# Patient Record
Sex: Male | Born: 1950
Health system: Southern US, Community
[De-identification: ages and names within clinical notes are randomized; demographics above are authoritative.]

## PROBLEM LIST (undated history)

## (undated) DIAGNOSIS — I10 Essential (primary) hypertension: Secondary | ICD-10-CM

## (undated) DIAGNOSIS — D6851 Activated protein C resistance: Secondary | ICD-10-CM

## (undated) DIAGNOSIS — I2510 Atherosclerotic heart disease of native coronary artery without angina pectoris: Secondary | ICD-10-CM

## (undated) HISTORY — DX: Activated protein C resistance: D68.51

## (undated) HISTORY — PX: HERNIA REPAIR: SHX51

## (undated) HISTORY — PX: ROTATOR CUFF REPAIR: SHX139

## (undated) HISTORY — PX: JOINT REPLACEMENT: SHX530

---

## 2015-11-06 DIAGNOSIS — M79605 Pain in left leg: Secondary | ICD-10-CM | POA: Insufficient documentation

## 2015-11-06 DIAGNOSIS — M7062 Trochanteric bursitis, left hip: Secondary | ICD-10-CM | POA: Insufficient documentation

## 2015-11-06 DIAGNOSIS — M545 Low back pain, unspecified: Secondary | ICD-10-CM | POA: Insufficient documentation

## 2017-01-05 DIAGNOSIS — M25562 Pain in left knee: Secondary | ICD-10-CM | POA: Insufficient documentation

## 2017-03-18 ENCOUNTER — Emergency Department (HOSPITAL_COMMUNITY): Payer: BLUE CROSS/BLUE SHIELD

## 2017-03-18 ENCOUNTER — Inpatient Hospital Stay (HOSPITAL_COMMUNITY)
Admission: EM | Admit: 2017-03-18 | Discharge: 2017-03-22 | DRG: 175 | Disposition: A | Discharge status: Home / Self Care | Payer: BLUE CROSS/BLUE SHIELD | Attending: Internal Medicine | Admitting: Internal Medicine

## 2017-03-18 ENCOUNTER — Other Ambulatory Visit: Payer: Self-pay

## 2017-03-18 ENCOUNTER — Encounter (HOSPITAL_COMMUNITY): Payer: Self-pay | Admitting: Emergency Medicine

## 2017-03-18 ENCOUNTER — Other Ambulatory Visit: Payer: Self-pay | Admitting: Critical Care Medicine

## 2017-03-18 DIAGNOSIS — J9601 Acute respiratory failure with hypoxia: Secondary | ICD-10-CM | POA: Diagnosis present

## 2017-03-18 DIAGNOSIS — R069 Unspecified abnormalities of breathing: Secondary | ICD-10-CM

## 2017-03-18 DIAGNOSIS — I2602 Saddle embolus of pulmonary artery with acute cor pulmonale: Principal | ICD-10-CM | POA: Diagnosis present

## 2017-03-18 DIAGNOSIS — I2609 Other pulmonary embolism with acute cor pulmonale: Secondary | ICD-10-CM | POA: Diagnosis not present

## 2017-03-18 DIAGNOSIS — D6851 Activated protein C resistance: Secondary | ICD-10-CM

## 2017-03-18 DIAGNOSIS — M25512 Pain in left shoulder: Secondary | ICD-10-CM

## 2017-03-18 DIAGNOSIS — R739 Hyperglycemia, unspecified: Secondary | ICD-10-CM

## 2017-03-18 DIAGNOSIS — Z86718 Personal history of other venous thrombosis and embolism: Secondary | ICD-10-CM | POA: Diagnosis not present

## 2017-03-18 DIAGNOSIS — R748 Abnormal levels of other serum enzymes: Secondary | ICD-10-CM | POA: Diagnosis not present

## 2017-03-18 DIAGNOSIS — E785 Hyperlipidemia, unspecified: Secondary | ICD-10-CM | POA: Diagnosis present

## 2017-03-18 DIAGNOSIS — Z7982 Long term (current) use of aspirin: Secondary | ICD-10-CM

## 2017-03-18 DIAGNOSIS — R000 Tachycardia, unspecified: Secondary | ICD-10-CM | POA: Diagnosis present

## 2017-03-18 DIAGNOSIS — I2699 Other pulmonary embolism without acute cor pulmonale: Secondary | ICD-10-CM | POA: Diagnosis present

## 2017-03-18 DIAGNOSIS — D649 Anemia, unspecified: Secondary | ICD-10-CM

## 2017-03-18 DIAGNOSIS — Z79899 Other long term (current) drug therapy: Secondary | ICD-10-CM | POA: Diagnosis not present

## 2017-03-18 DIAGNOSIS — M62830 Muscle spasm of back: Secondary | ICD-10-CM

## 2017-03-18 DIAGNOSIS — R0902 Hypoxemia: Secondary | ICD-10-CM

## 2017-03-18 DIAGNOSIS — R7989 Other specified abnormal findings of blood chemistry: Secondary | ICD-10-CM

## 2017-03-18 DIAGNOSIS — I82412 Acute embolism and thrombosis of left femoral vein: Secondary | ICD-10-CM | POA: Diagnosis present

## 2017-03-18 DIAGNOSIS — I82432 Acute embolism and thrombosis of left popliteal vein: Secondary | ICD-10-CM | POA: Diagnosis present

## 2017-03-18 DIAGNOSIS — I82442 Acute embolism and thrombosis of left tibial vein: Secondary | ICD-10-CM | POA: Diagnosis present

## 2017-03-18 DIAGNOSIS — I2721 Secondary pulmonary arterial hypertension: Secondary | ICD-10-CM | POA: Diagnosis present

## 2017-03-18 DIAGNOSIS — I10 Essential (primary) hypertension: Secondary | ICD-10-CM | POA: Diagnosis present

## 2017-03-18 DIAGNOSIS — R0602 Shortness of breath: Secondary | ICD-10-CM

## 2017-03-18 DIAGNOSIS — I2692 Saddle embolus of pulmonary artery without acute cor pulmonale: Secondary | ICD-10-CM | POA: Diagnosis not present

## 2017-03-18 HISTORY — DX: Essential (primary) hypertension: I10

## 2017-03-18 LAB — COMPREHENSIVE METABOLIC PANEL
ALBUMIN: 3.7 g/dL (ref 3.5–5.0)
ALT: 24 U/L (ref 17–63)
ANION GAP: 11 (ref 5–15)
AST: 21 U/L (ref 15–41)
Alkaline Phosphatase: 63 U/L (ref 38–126)
BILIRUBIN TOTAL: 2.4 mg/dL — AB (ref 0.3–1.2)
BUN: 18 mg/dL (ref 6–20)
CALCIUM: 9.6 mg/dL (ref 8.9–10.3)
CO2: 23 mmol/L (ref 22–32)
CREATININE: 1.23 mg/dL (ref 0.61–1.24)
Chloride: 102 mmol/L (ref 101–111)
GFR calc non Af Amer: >60 mL/min (ref >60)
GLUCOSE: 101 mg/dL — AB (ref 65–99)
Potassium: 4.6 mmol/L (ref 3.5–5.1)
Sodium: 136 mmol/L (ref 135–145)
TOTAL PROTEIN: 7.6 g/dL (ref 6.5–8.1)

## 2017-03-18 LAB — CBC WITH DIFFERENTIAL/PLATELET
BASOS ABS: 0 10*3/uL (ref 0.0–0.1)
Basophils Relative: 0 %
EOS ABS: 0.1 10*3/uL (ref 0.0–0.7)
Eosinophils Relative: 1 %
HEMATOCRIT: 47.1 % (ref 39.0–52.0)
Hemoglobin: 15.5 g/dL (ref 13.0–17.0)
Lymphocytes Relative: 20 %
Lymphs Abs: 1.6 10*3/uL (ref 0.7–4.0)
MCH: 30.9 pg (ref 26.0–34.0)
MCHC: 32.9 g/dL (ref 30.0–36.0)
MCV: 93.8 fL (ref 78.0–100.0)
MONO ABS: 0.5 10*3/uL (ref 0.1–1.0)
MONOS PCT: 6 %
Neutro Abs: 6 10*3/uL (ref 1.7–7.7)
Neutrophils Relative %: 73 %
PLATELETS: 198 10*3/uL (ref 150–400)
RBC: 5.02 MIL/uL (ref 4.22–5.81)
RDW: 13.3 % (ref 11.5–15.5)
WBC: 8.1 10*3/uL (ref 4.0–10.5)

## 2017-03-18 LAB — TROPONIN I
TROPONIN I: 0.07 ng/mL — AB (ref <0.03)
Troponin I: 0.07 ng/mL (ref <0.03)

## 2017-03-18 LAB — BRAIN NATRIURETIC PEPTIDE: B Natriuretic Peptide: 327.7 pg/mL — ABNORMAL HIGH (ref 0.0–100.0)

## 2017-03-18 LAB — LACTIC ACID, PLASMA
Lactic Acid, Venous: 1 mmol/L (ref 0.5–1.9)
Lactic Acid, Venous: 1 mmol/L (ref 0.5–1.9)

## 2017-03-18 MED ORDER — HEPARIN (PORCINE) IN NACL 100-0.45 UNIT/ML-% IJ SOLN
1650.0000 [IU]/h | INTRAMUSCULAR | Status: DC
  Administered 2017-03-18 – 2017-03-19 (×2): 1600 [IU]/h via INTRAVENOUS
  Administered 2017-03-19 – 2017-03-22 (×3): 1500 [IU]/h via INTRAVENOUS
  Filled 2017-03-18 (×10): qty 250

## 2017-03-18 MED ORDER — HEPARIN BOLUS VIA INFUSION
5500.0000 [IU] | Freq: Once | INTRAVENOUS | Status: AC
  Administered 2017-03-18: 5500 [IU] via INTRAVENOUS
  Filled 2017-03-18: qty 5500

## 2017-03-18 MED ORDER — IOPAMIDOL (ISOVUE-370) INJECTION 76%
INTRAVENOUS | Status: AC
  Administered 2017-03-18: 100 mL
  Filled 2017-03-18: qty 100

## 2017-03-18 MED ORDER — ATORVASTATIN CALCIUM 10 MG PO TABS
10.0000 mg | ORAL_TABLET | Freq: Every day | ORAL | Status: DC
  Administered 2017-03-19 – 2017-03-22 (×4): 10 mg via ORAL
  Filled 2017-03-18 (×4): qty 1

## 2017-03-18 MED ORDER — ASPIRIN EC 81 MG PO TBEC
81.0000 mg | DELAYED_RELEASE_TABLET | Freq: Every day | ORAL | Status: DC
  Administered 2017-03-18 – 2017-03-21 (×4): 81 mg via ORAL
  Filled 2017-03-18 (×6): qty 1

## 2017-03-18 NOTE — ED Triage Notes (Addendum)
Pt arrives from PCP reporting SOB with lack of energy x 10 days, pt reports dry cough, denies edema.   Pt denies recent travel, recent illness, fevers/chills, dysuria.

## 2017-03-18 NOTE — ED Triage Notes (Signed)
Pt in and [place], MD office called and requested a CT angio be ordered, [place], MD notified to see if the order can be placed while the pt waits for a room

## 2017-03-18 NOTE — ED Notes (Signed)
Patient transported to CT 

## 2017-03-18 NOTE — ED Provider Notes (Signed)
[facility] EMERGENCY DEPARTMENT Provider Note   CSN: [id number] Arrival date & time: 03/18/17  1235     History   Chief Complaint Chief Complaint  Patient presents with  . Shortness of Breath    HPI Jose Bowers is a 67 y.o. male.  HPI  Patient with history of factor V Leiden and history of prior right upper extremity DVT presents with complaint of shortness of breath fatigue which is been present over the past 10 days.  He states he has shortness of breath with walking very small distances or even putting on his socks.  He denies any pleuritic chest pain but does feel some tightness in his chest.  He has not had any leg swelling.  He has not had any recent travel trauma or surgery.  He was seen by his primary care doctor today and advised to come to the ED for further evaluation.There are no other associated systemic symptoms, there are no other alleviating or modifying factors.   Past Medical History:  Diagnosis Date  . Hypertension     There are no active problems to display for this patient.   Past Surgical History:  Procedure Laterality Date  . HERNIA REPAIR    . JOINT REPLACEMENT    . ROTATOR CUFF REPAIR         Home Medications    Prior to Admission medications   Not on File    Family History No family history on file.  Social History Social History   Tobacco Use  . Smoking status: Never Smoker  . Smokeless tobacco: Never Used  Substance Use Topics  . Alcohol use: Yes    Comment: "few times a week"  . Drug use: No     Allergies   Patient has no allergy information on record.   Review of Systems Review of Systems  ROS reviewed and all otherwise negative except for mentioned in HPI   Physical Exam Updated Vital Signs BP 101/72   Pulse (!) 111   Temp 99.6 F (37.6 C) (Oral)   Resp 20   Ht 5\' 10"  (1.778 m)   Wt 94.8 kg (209 lb)   SpO2 97%   BMI 29.99 kg/m  Vitals reviewed Physical Exam  Physical Examination:  General appearance - alert, well appearing, and in no distress Mental status - alert, oriented to person, place, and time Eyes - no conjunctival injection, no scleral icterus Mouth - mucous membranes moist, pharynx normal without lesions Neck - supple, no significant adenopathy Chest - clear to auscultation, no wheezes, rales or rhonchi, symmetric air entry Heart - increased rate, regular rhythm, normal S1, S2, no murmurs, rubs, clicks or gallops Abdomen - soft, nontender, nondistended, no masses or organomegaly Neurological - alert, oriented, normal speech,  Extremities - peripheral pulses normal, no pedal edema, no clubbing or cyanosis Skin - normal coloration and turgor, no rashes   ED Treatments / Results  Labs (all labs ordered are listed, but only abnormal results are displayed) Labs Reviewed  COMPREHENSIVE METABOLIC PANEL - Abnormal; Notable for the following components:      Result Value   Glucose, Bld 101 (*)    Total Bilirubin 2.4 (*)    All other components within normal limits  TROPONIN I - Abnormal; Notable for the following components:   Troponin I 0.07 (*)    All other components within normal limits  CBC WITH DIFFERENTIAL/PLATELET  HEPARIN LEVEL (UNFRACTIONATED)  HEPARIN LEVEL (UNFRACTIONATED)  CBC  EKG  EKG Interpretation  Date/Time:  Friday March 18 2017 13:08:49 EST Ventricular Rate:  113 PR Interval:  144 QRS Duration: 96 QT Interval:  336 QTC Calculation: 460 R Axis:   40 Text Interpretation:  Sinus tachycardia Nonspecific T wave abnormality Abnormal ECG No old tracing to compare Confirmed by Alfonzo Beers [phone]) on 03/18/2017 4:11:54 PM       Radiology Dg Chest 2 View  Result Date: 03/18/2017 CLINICAL DATA:  Shortness of breath and chest pain. EXAM: CHEST  2 VIEW COMPARISON:  None. FINDINGS: The lungs are clear. Heart size is upper normal. No pneumothorax or pleural fluid. No bony abnormality. IMPRESSION: No acute disease. Electronically  Signed   By: Inge Rise M.D.   On: 03/18/2017 13:58   Ct Angio Chest Pe W And/or Wo Contrast  Result Date: 03/18/2017 CLINICAL DATA:  Chest pain.  Shortness of breath. EXAM: CT ANGIOGRAPHY CHEST WITH CONTRAST TECHNIQUE: Multidetector CT imaging of the chest was performed using the standard protocol during bolus administration of intravenous contrast. Multiplanar CT image reconstructions and MIPs were obtained to evaluate the vascular anatomy. CONTRAST:  3mL ISOVUE-370 IOPAMIDOL (ISOVUE-370) INJECTION 76% COMPARISON:  Chest radiograph 03/18/2017 FINDINGS: Cardiovascular: Large central bilateral pulmonary emboli. Nearly occlusive left main pulmonary artery embolus, extending to all 3 left-sided lobar arteries, and nonocclusive into the right main pulmonary artery, extending to the right upper lobe and right lower lobar pulmonary arteries, and several segmental branches. Enlargement of the right ventricle with RV/ LV ratio 1.5, evidence of significant right heart strain. Normal caliber of the aorta. Mediastinum/Nodes: No enlarged mediastinal, hilar, or axillary lymph nodes. Thyroid gland, trachea, and esophagus demonstrate no significant findings. Lungs/Pleura: Lungs are clear. No pleural effusion or pneumothorax. Upper Abdomen: No acute abnormality. Musculoskeletal: No chest wall abnormality. No acute or significant osseous findings. Review of the MIP images confirms the above findings. IMPRESSION: Large central bilateral pulmonary embolus, which is nearly occlusive in the left main pulmonary artery extending to all 3 left-sided lobar arterial branches. Nonocclusive right main pulmonary artery embolus extending to the right upper lobar and right lower lobar pulmonary arteries. Heavy clot burden with marked right heart strain with RV/LV ratio of 1.5. Clear lungs. Critical Value/emergent results were called by telephone at the time of interpretation on 03/18/2017 at 4:09 pm to Dr. Pattricia Boss, who verbally  acknowledged these results. Electronically Signed   By: Fidela Salisbury M.D.   On: 03/18/2017 16:10    Procedures Procedures (including critical care time)  Medications Ordered in ED Medications  heparin ADULT infusion 100 units/mL (25000 units/257mL sodium chloride 0.45%) (1,600 Units/hr Intravenous New Bag/Given 03/18/17 1648)  iopamidol (ISOVUE-370) 76 % injection (100 mLs  Contrast Given 03/18/17 1549)  heparin bolus via infusion 5,500 Units (5,500 Units Intravenous Bolus from Bag 03/18/17 1648)   CRITICAL CARE Performed by: Marcha Dutton, Mareena Cavan L Total critical care time: 40  minutes Critical care time was exclusive of separately billable procedures and treating other patients. Critical care was necessary to treat or prevent imminent or life-threatening deterioration. Critical care was time spent personally by me on the following activities: development of treatment plan with patient and/or surrogate as well as nursing, discussions with consultants, evaluation of patient's response to treatment, examination of patient, obtaining history from patient or surrogate, ordering and performing treatments and interventions, ordering and review of laboratory studies, ordering and review of radiographic studies, pulse oximetry and re-evaluation of patient's condition.  Initial Impression / Assessment and Plan / ED Course  I have reviewed the triage vital signs and the nursing notes.  Pertinent labs & imaging results that were available during my care of the patient were reviewed by me and considered in my medical decision making (see chart for details).    4:27 PM heparin ordered and have called PCCM- awaiting call back.  Will also d/w interventional radiologist.  Pt is awake, alert no respiratory distress, talkative.  Plan d/w patient and his wife at the bedside.    4:34 PM d/w critical care, they will see patient in the ED, calling interventional radiology  4:38 PM   D/w Dr. Anselm Pancoast, IR- they will see  patient    Final Clinical Impressions(s) / ED Diagnoses   Final diagnoses:  Other acute pulmonary embolism with acute cor pulmonale ([place])  Hypoxia  Tachycardia    ED Discharge Orders    None       Pixie Casino, MD 03/18/17 1732

## 2017-03-18 NOTE — Progress Notes (Signed)
ANTICOAGULATION CONSULT NOTE - Initial Consult  Pharmacy Consult for heparin Indication: pulmonary embolus  Not on File  Patient Measurements: Height: 5\' 10"  (177.8 cm) Weight: 209 lb (94.8 kg) IBW/kg (Calculated) : 73 Heparin Dosing Weight: 92kg  Vital Signs: Temp: 99.6 F (37.6 C) (01/04 1317) Temp Source: Oral (01/04 1317) BP: 108/77 (01/04 1600) Pulse Rate: 113 (01/04 1600)  Labs: Recent Labs    03/18/17 1331  HGB 15.5  HCT 47.1  PLT 198  CREATININE 1.23  TROPONINI 0.07*    Estimated Creatinine Clearance: 70.1 mL/min (by C-G formula based on SCr of 1.23 mg/dL).   Medical History: Past Medical History:  Diagnosis Date  . Hypertension     Medications:  Infusions:  . heparin      Assessment: 71 yom presented to the ED with SOB. Found to have large bilateral PE with right heart strain. Starting IV heparin. Baseline H/H + platelets are WNL. Troponin is mildly elevated. He is not on anticoagulation PTA but has been on xarelto in the past for history of DVT.   Goal of Therapy:  Heparin level 0.3-0.7 units/ml Monitor platelets by anticoagulation protocol: Yes   Plan:  Heparin bolus 5500 units IV x 1 Heparin gtt 1600 units/hr Check a 6 hr heparin level  Daily heparin level and CBC  Zarahi Fuerst, Rande Lawman 03/18/2017,4:47 PM

## 2017-03-18 NOTE — ED Provider Notes (Signed)
Received call from radiology patient with large pe with right heart strain- discussed with Dr. Canary Brim.   Pattricia Boss, MD 03/18/17 [phone]

## 2017-03-18 NOTE — H&P (Deleted)
Chief Complaint: Patient was seen in consultation today for  Chief Complaint  Patient presents with  . Shortness of Breath    Referring Physician(s): ED  Supervising Physician: Jacqulynn Cadet  Patient Status: [facility] - ED  History of Present Illness: Jose Bowers is a 67 y.o. male with Factor V Leiden presented to the ED today with SOB.  His father also has Factor V Leiden.  He states he has had some knee pain and has not been as active as he usually is. He did have steroid injection in his knee a few weeks ago.  He states he had shoulder surgery several years ago and developed a DVT in his arm. He was on blood thinners for 3 months then he was taken off of them.  He denies recent long travel, car nor a plane.   He currently stable. Sats were 92% on room air initially, but now he is on 3 liters [place] and is 97%.  He ate a power bar 2 hours ago.  He denies any bleeding risk. No recent surgeries. No GI bleeding. No history of intracranial bleeding.  Past Medical History:  Diagnosis Date  . Hypertension     Past Surgical History:  Procedure Laterality Date  . HERNIA REPAIR    . JOINT REPLACEMENT    . ROTATOR CUFF REPAIR      Allergies: Patient has no allergy information on record.  Medications: Prior to Admission medications   Not on File     No family history on file.  Social History   Socioeconomic History  . Marital status: Married    Spouse name: None  . Number of children: None  . Years of education: None  . Highest education level: None  Social Needs  . Financial resource strain: None  . Food insecurity - worry: None  . Food insecurity - inability: None  . Transportation needs - medical: None  . Transportation needs - non-medical: None  Occupational History  . None  Tobacco Use  . Smoking status: Never Smoker  . Smokeless tobacco: Never Used  Substance and Sexual Activity  . Alcohol use: Yes    Comment: "few times a week"  . Drug  use: No  . Sexual activity: None  Other Topics Concern  . None  Social History Narrative  . None     Review of Systems: A 12 point ROS discussed and pertinent positives are indicated in the HPI above.  All other systems are negative.  Review of Systems  Cardiovascular: Positive for chest pain.  Genitourinary: Positive for discharge.    Vital Signs: BP 101/72   Pulse (!) 111   Temp 99.6 F (37.6 C) (Oral)   Resp 20   Ht 5\' 10"  (1.778 m)   Wt 209 lb (94.8 kg)   SpO2 97%   BMI 29.99 kg/m   Physical Exam  Constitutional: He is oriented to person, place, and time. He appears well-developed.  HENT:  Head: Normocephalic and atraumatic.  Eyes: EOM are normal.  Neck: Normal range of motion.  Cardiovascular: Normal rate, regular rhythm and normal heart sounds.  Pulmonary/Chest: Effort normal. No respiratory distress. He has no wheezes.  Abdominal: Soft. He exhibits no distension.  Musculoskeletal: Normal range of motion.  Neurological: He is alert and oriented to person, place, and time.  Skin: Skin is warm and dry.  Psychiatric: He has a normal mood and affect. His behavior is normal. Judgment and thought content normal.  Vitals reviewed.  Imaging: Dg Chest 2 View  Result Date: 03/18/2017 CLINICAL DATA:  Shortness of breath and chest pain. EXAM: CHEST  2 VIEW COMPARISON:  None. FINDINGS: The lungs are clear. Heart size is upper normal. No pneumothorax or pleural fluid. No bony abnormality. IMPRESSION: No acute disease. Electronically Signed   By: Inge Rise M.D.   On: 03/18/2017 13:58   Ct Angio Chest Pe W And/or Wo Contrast  Result Date: 03/18/2017 CLINICAL DATA:  Chest pain.  Shortness of breath. EXAM: CT ANGIOGRAPHY CHEST WITH CONTRAST TECHNIQUE: Multidetector CT imaging of the chest was performed using the standard protocol during bolus administration of intravenous contrast. Multiplanar CT image reconstructions and MIPs were obtained to evaluate the vascular  anatomy. CONTRAST:  9mL ISOVUE-370 IOPAMIDOL (ISOVUE-370) INJECTION 76% COMPARISON:  Chest radiograph 03/18/2017 FINDINGS: Cardiovascular: Large central bilateral pulmonary emboli. Nearly occlusive left main pulmonary artery embolus, extending to all 3 left-sided lobar arteries, and nonocclusive into the right main pulmonary artery, extending to the right upper lobe and right lower lobar pulmonary arteries, and several segmental branches. Enlargement of the right ventricle with RV/ LV ratio 1.5, evidence of significant right heart strain. Normal caliber of the aorta. Mediastinum/Nodes: No enlarged mediastinal, hilar, or axillary lymph nodes. Thyroid gland, trachea, and esophagus demonstrate no significant findings. Lungs/Pleura: Lungs are clear. No pleural effusion or pneumothorax. Upper Abdomen: No acute abnormality. Musculoskeletal: No chest wall abnormality. No acute or significant osseous findings. Review of the MIP images confirms the above findings. IMPRESSION: Large central bilateral pulmonary embolus, which is nearly occlusive in the left main pulmonary artery extending to all 3 left-sided lobar arterial branches. Nonocclusive right main pulmonary artery embolus extending to the right upper lobar and right lower lobar pulmonary arteries. Heavy clot burden with marked right heart strain with RV/LV ratio of 1.5. Clear lungs. Critical Value/emergent results were called by telephone at the time of interpretation on 03/18/2017 at 4:09 pm to Dr. Pattricia Boss, who verbally acknowledged these results. Electronically Signed   By: Fidela Salisbury M.D.   On: 03/18/2017 16:10    Labs:  CBC: Recent Labs    03/18/17 1331  WBC 8.1  HGB 15.5  HCT 47.1  PLT 198    COAGS: No results for input(s): INR, APTT in the last 8760 hours.  BMP: Recent Labs    03/18/17 1331  NA 136  K 4.6  CL 102  CO2 23  GLUCOSE 101*  BUN 18  CALCIUM 9.6  CREATININE 1.23  GFRNONAA >60  GFRAA >60    LIVER FUNCTION  TESTS: Recent Labs    03/18/17 1331  BILITOT 2.4*  AST 21  ALT 24  ALKPHOS 63  PROT 7.6  ALBUMIN 3.7    TUMOR MARKERS: No results for input(s): AFPTM, CEA, CA199, CHROMGRNA in the last 8760 hours.  Assessment and Plan:  Acute massive PE in the setting Factor V Leiden   Will proceed with initiation of catheter directed lysis by Dr. Laurence Ferrari.  Risks and benefits discussed with the patient including, but not limited to bleeding, possible life threatening bleeding and need for blood product transfusion, vascular injury, stroke, contrast induced renal failure, limb loss and infection.  All of the patient's questions were answered, patient is agreeable to proceed. Consent signed and in chart.   Electronically Signed: Murrell Redden, PA-C 03/18/2017, 5:18 PM   I spent a total of 40 Minutes in face to face in clinical consultation, greater than 50% of which was counseling/coordinating care for PE lysis.

## 2017-03-18 NOTE — H&P (Signed)
Name: Jose Bowers MRN: [id number] DOB: [birth year]    ADMISSION DATE:  03/18/2017 CONSULTATION DATE:  Ernst Breach  REFERRING MD :  Dr. Marcha Dutton EDP  CHIEF COMPLAINT:  SOB  HISTORY OF PRESENT ILLNESS: 67 year old male with past medical history as below, which is significant for hypertension, hyperlipidemia, and factor V Leiden.  He does have a history of DVT after shoulder surgery about 5 years ago, which was treated with Xarelto.  Recent course is significant until about 3 weeks ago when he developed left knee pain and was seen in primary care and given a steroid injection.  Reports symptoms resolved at that time.  About 10 days prior to arrival he began to experience dyspnea dyspnea was progressive over the following 10 days occasional chest tightness.  Until he was unable to walk throughout his house or put on his socks without becoming short of breath.  He presented to [facility] emergency department 1/4 with this complaint.  He had a CT angiogram of his chest performed, which demonstrated saddle PE with large clot burden.  RV/LV ratio 1.5.  He denies any recent injury and travel. EDP contacted interventional radiology for consideration of EKOS and PCCM has been asked to see for admission.   SIGNIFICANT EVENTS    STUDIES:  CT angiogram chest 1/4 > Large central bilateral pulmonary embolus, which is nearly occlusive in the left main pulmonary artery extending to all 3 left-sided lobar arterial branches. Nonocclusive right main pulmonary artery embolus extending to the right upper lobar and right lower lobar pulmonary arteries. Heavy clot burden with marked right heart strain with RV/LV ratio of 1.5.   PAST MEDICAL HISTORY :   has a past medical history of Hypertension.  has a past surgical history that includes Joint replacement; Rotator cuff repair; and Hernia repair. Prior to Admission medications   Medication Sig Start Date End Date Taking? Authorizing Provider  amoxicillin (AMOXIL) 500 MG  capsule Take 2,000 mg by mouth See admin instructions. Take 4 capsules (2000 mg) by mouth one hour prior to dental appointment (last visit 01/30/17)   Yes [provider]  aspirin EC 81 MG tablet Take 81 mg by mouth at bedtime.   Yes [provider]  atorvastatin (LIPITOR) 10 MG tablet Take 10 mg by mouth daily. 02/14/17  Yes [provider]  fluocinonide cream (LIDEX) 5.00 % Apply 1 application topically daily as needed (itching/rash (Grover's disease)).   Yes [provider]  lisinopril-hydrochlorothiazide (PRINZIDE,ZESTORETIC) 20-12.5 MG tablet Take 1 tablet by mouth daily. 02/14/17  Yes [provider]   No Known Allergies  FAMILY HISTORY:  family history is not on file. SOCIAL HISTORY:  reports that  has never smoked. he has never used smokeless tobacco. He reports that he drinks alcohol. He reports that he does not use drugs.  Review of Systems:   Bolds are positive  Constitutional: weight loss, gain, night sweats, Fevers, chills, fatigue .  HEENT: headaches, Sore throat, sneezing, nasal congestion, post nasal drip, Difficulty swallowing, Tooth/dental problems, visual complaints visual changes, ear ache CV:  chest pain, radiates:*Orthopnea, PND, swelling in lower extremities, dizziness, palpitations, syncope.  GI  heartburn, indigestion, abdominal pain, nausea, vomiting, diarrhea, change in bowel habits, loss of appetite, bloody stools.  Resp: cough, productive: , hemoptysis, dyspnea, chest pain, pleuritic.  Skin: rash or itching or icterus GU: dysuria, change in color of urine, urgency or frequency. flank pain, hematuria  MS: joint pain or swelling. decreased range of motion  Psych: change in  mood or affect. depression or anxiety.  Neuro: difficulty with speech, weakness, numbness, ataxia    SUBJECTIVE:   VITAL SIGNS: Temp:  [99.6 F (37.6 C)] 99.6 F (37.6 C) (01/04 1317) Pulse Rate:  [109-115] 109 (01/04 1730) Resp:  [20] 20 (01/04  1317) BP: (93-108)/(72-81) 106/81 (01/04 1730) SpO2:  [92 %-97 %] 97 % (01/04 1730) Weight:  [94.8 kg (209 lb)] 94.8 kg (209 lb) (01/04 1626)  PHYSICAL EXAMINATION: General:  Overweight male in NAD resting comfortably in bed Neuro:  Alert, oriented, non-focal.  HEENT:  [place]/AT, PERRL, no JVD Cardiovascular:  RRR, no MRG Lungs:  Clear bilateral breath sounds Abdomen:  Soft, non-tender, non-distended Musculoskeletal:  No acute deformity or ROM limitation Skin:  Grossly intact.   Recent Labs  Lab 03/18/17 1331  NA 136  K 4.6  CL 102  CO2 23  BUN 18  CREATININE 1.23  GLUCOSE 101*   Recent Labs  Lab 03/18/17 1331  HGB 15.5  HCT 47.1  WBC 8.1  PLT 198   Dg Chest 2 View  Result Date: 03/18/2017 CLINICAL DATA:  Shortness of breath and chest pain. EXAM: CHEST  2 VIEW COMPARISON:  None. FINDINGS: The lungs are clear. Heart size is upper normal. No pneumothorax or pleural fluid. No bony abnormality. IMPRESSION: No acute disease. Electronically Signed   By: Inge Rise M.D.   On: 03/18/2017 13:58   Ct Angio Chest Pe W And/or Wo Contrast  Result Date: 03/18/2017 CLINICAL DATA:  Chest pain.  Shortness of breath. EXAM: CT ANGIOGRAPHY CHEST WITH CONTRAST TECHNIQUE: Multidetector CT imaging of the chest was performed using the standard protocol during bolus administration of intravenous contrast. Multiplanar CT image reconstructions and MIPs were obtained to evaluate the vascular anatomy. CONTRAST:  38mL ISOVUE-370 IOPAMIDOL (ISOVUE-370) INJECTION 76% COMPARISON:  Chest radiograph 03/18/2017 FINDINGS: Cardiovascular: Large central bilateral pulmonary emboli. Nearly occlusive left main pulmonary artery embolus, extending to all 3 left-sided lobar arteries, and nonocclusive into the right main pulmonary artery, extending to the right upper lobe and right lower lobar pulmonary arteries, and several segmental branches. Enlargement of the right ventricle with RV/ LV ratio 1.5, evidence of  significant right heart strain. Normal caliber of the aorta. Mediastinum/Nodes: No enlarged mediastinal, hilar, or axillary lymph nodes. Thyroid gland, trachea, and esophagus demonstrate no significant findings. Lungs/Pleura: Lungs are clear. No pleural effusion or pneumothorax. Upper Abdomen: No acute abnormality. Musculoskeletal: No chest wall abnormality. No acute or significant osseous findings. Review of the MIP images confirms the above findings. IMPRESSION: Large central bilateral pulmonary embolus, which is nearly occlusive in the left main pulmonary artery extending to all 3 left-sided lobar arterial branches. Nonocclusive right main pulmonary artery embolus extending to the right upper lobar and right lower lobar pulmonary arteries. Heavy clot burden with marked right heart strain with RV/LV ratio of 1.5. Clear lungs. Critical Value/emergent results were called by telephone at the time of interpretation on 03/18/2017 at 4:09 pm to Dr. Pattricia Boss, who verbally acknowledged these results. Electronically Signed   By: Fidela Salisbury M.D.   On: 03/18/2017 16:10    ASSESSMENT / PLAN:  Pulmonary embolism: large saddle PE occlusive on the L. Large clot burden. RV/LV ratio 1.5. In the setting of Factor V Leiden. Troponin mildly elevated. Despite appearance on CT, patient looks pretty good. He has no respiratory distress on 2L [place] with O2 sats 99%. He is able to speak in full paragraphs without stopping to catch his breath. Case has been discussed with  interventional radiology. He is considered a candidate for EKOS, however, based on his clinical appearance we will treat with heparin and assess echocardiogram. Will also trend troponin. If R heart strain appears significant on these studies will proceed with EKOS in the AM.  Plan: Continue heparin Trend troponin Echocardiogram Venous dopplers Supplemental O2 Re-evaluate need for EKOS after echo.  Will likely need life-long anticoagulation in the  setting of second clot and Factor V  Hypertension - Holding lisinopril/HCTZ - Continue asa 81mg   Hyperlipidemia - Continue atorvastatin  Diet: heart healthy VTE ppx: full dose heparin Dispo: SDU Code: FULL  Georgann Housekeeper, AGACNP-BC [facility] Pager [phone] or [phone]  03/18/2017 6:42 PM

## 2017-03-19 ENCOUNTER — Inpatient Hospital Stay (HOSPITAL_COMMUNITY): Payer: BLUE CROSS/BLUE SHIELD

## 2017-03-19 ENCOUNTER — Encounter (HOSPITAL_COMMUNITY): Payer: Self-pay | Admitting: Interventional Radiology

## 2017-03-19 DIAGNOSIS — I2692 Saddle embolus of pulmonary artery without acute cor pulmonale: Secondary | ICD-10-CM

## 2017-03-19 DIAGNOSIS — I2699 Other pulmonary embolism without acute cor pulmonale: Secondary | ICD-10-CM

## 2017-03-19 HISTORY — PX: IR ANGIOGRAM SELECTIVE EACH ADDITIONAL VESSEL: IMG667

## 2017-03-19 HISTORY — PX: IR INFUSION THROMBOL ARTERIAL INITIAL (MS): IMG5376

## 2017-03-19 HISTORY — PX: IR ANGIOGRAM PULMONARY BILATERAL SELECTIVE: IMG664

## 2017-03-19 HISTORY — PX: IR US GUIDE VASC ACCESS RIGHT: IMG2390

## 2017-03-19 LAB — ECHOCARDIOGRAM COMPLETE
CHL CUP TV REG PEAK VELOCITY: 290 cm/s
E decel time: 109 msec
E/e' ratio: 8.46
FS: 31 % (ref 28–44)
HEIGHTINCHES: 70 in
IVS/LV PW RATIO, ED: 1.22
LA ID, A-P, ES: 35 mm
LA diam end sys: 35 mm
LA diam index: 1.59 cm/m2
LA vol index: 24 mL/m2
LA vol: 53 mL
LAVOLA4C: 52.3 mL
LV TDI E'LATERAL: 5.33
LV e' LATERAL: 5.33 cm/s
LVEEAVG: 8.46
LVEEMED: 8.46
LVOT SV: 58 mL
LVOT VTI: 13.9 cm
LVOT area: 4.15 cm2
LVOT diameter: 23 mm
LVOTPV: 89.4 cm/s
Lateral S' vel: 7.94 cm/s
MV Dec: 109
MV pk A vel: 92.2 m/s
MVPKEVEL: 45.1 m/s
PW: 10.4 mm — AB (ref 0.6–1.1)
RV sys press: 42 mmHg
TAPSE: 17.1 mm
TDI e' medial: 4.46
TR max vel: 290 cm/s
WEIGHTICAEL: 3396.85 [oz_av]

## 2017-03-19 LAB — CBC
HCT: 43.7 % (ref 39.0–52.0)
HCT: 43.1 % (ref 39.0–52.0)
Hemoglobin: 14.1 g/dL (ref 13.0–17.0)
Hemoglobin: 13.9 g/dL (ref 13.0–17.0)
MCH: 29.7 pg (ref 26.0–34.0)
MCH: 29.9 pg (ref 26.0–34.0)
MCHC: 32.3 g/dL (ref 30.0–36.0)
MCHC: 32.3 g/dL (ref 30.0–36.0)
MCV: 92 fL (ref 78.0–100.0)
MCV: 92.7 fL (ref 78.0–100.0)
PLATELETS: 184 10*3/uL (ref 150–400)
PLATELETS: 187 10*3/uL (ref 150–400)
RBC: 4.75 MIL/uL (ref 4.22–5.81)
RBC: 4.65 MIL/uL (ref 4.22–5.81)
RDW: 13 % (ref 11.5–15.5)
RDW: 12.9 % (ref 11.5–15.5)
WBC: 8.8 10*3/uL (ref 4.0–10.5)
WBC: 10.6 10*3/uL — AB (ref 4.0–10.5)

## 2017-03-19 LAB — TROPONIN I
Troponin I: 0.04 ng/mL (ref <0.03)
Troponin I: 0.04 ng/mL (ref <0.03)

## 2017-03-19 LAB — HEPARIN LEVEL (UNFRACTIONATED)
HEPARIN UNFRACTIONATED: 0.49 [IU]/mL (ref 0.30–0.70)
HEPARIN UNFRACTIONATED: 0.72 [IU]/mL — AB (ref 0.30–0.70)
Heparin Unfractionated: 0.47 IU/mL (ref 0.30–0.70)

## 2017-03-19 LAB — MRSA PCR SCREENING: MRSA by PCR: NEGATIVE

## 2017-03-19 LAB — FIBRINOGEN: Fibrinogen: 552 mg/dL — ABNORMAL HIGH (ref 210–475)

## 2017-03-19 LAB — HIV ANTIBODY (ROUTINE TESTING W REFLEX): HIV SCREEN 4TH GENERATION: NONREACTIVE

## 2017-03-19 MED ORDER — SODIUM CHLORIDE 0.9% FLUSH
3.0000 mL | Freq: Two times a day (BID) | INTRAVENOUS | Status: DC
  Administered 2017-03-20: 3 mL via INTRAVENOUS

## 2017-03-19 MED ORDER — FENTANYL CITRATE (PF) 100 MCG/2ML IJ SOLN
INTRAMUSCULAR | Status: AC | PRN
  Administered 2017-03-19: 25 ug via INTRAVENOUS
  Administered 2017-03-19: 50 ug via INTRAVENOUS
  Administered 2017-03-19: 25 ug via INTRAVENOUS

## 2017-03-19 MED ORDER — MIDAZOLAM HCL 2 MG/2ML IJ SOLN
INTRAMUSCULAR | Status: AC | PRN
  Administered 2017-03-19: 1 mg via INTRAVENOUS
  Administered 2017-03-19 (×2): 0.5 mg via INTRAVENOUS

## 2017-03-19 MED ORDER — HYDROMORPHONE HCL 1 MG/ML IJ SOLN
1.0000 mg | INTRAMUSCULAR | Status: AC | PRN
  Administered 2017-03-19: 1 mg via INTRAVENOUS
  Filled 2017-03-19: qty 1

## 2017-03-19 MED ORDER — SODIUM CHLORIDE 0.9 % IV SOLN
INTRAVENOUS | Status: DC
  Administered 2017-03-19 – 2017-03-20 (×2): via INTRAVENOUS

## 2017-03-19 MED ORDER — FENTANYL CITRATE (PF) 100 MCG/2ML IJ SOLN
INTRAMUSCULAR | Status: AC
  Filled 2017-03-19: qty 4

## 2017-03-19 MED ORDER — SODIUM CHLORIDE 0.9 % IV SOLN: 250.0000 mL | INTRAVENOUS | Status: DC | PRN

## 2017-03-19 MED ORDER — DIAZEPAM 5 MG/ML IJ SOLN
INTRAMUSCULAR | Status: AC
  Filled 2017-03-19: qty 2

## 2017-03-19 MED ORDER — DIAZEPAM 5 MG/ML IJ SOLN
2.5000 mg | INTRAMUSCULAR | Status: DC | PRN
  Administered 2017-03-19: 2.5 mg via INTRAVENOUS

## 2017-03-19 MED ORDER — PHENOL 1.4 % MT LIQD
1.0000 | OROMUCOSAL | Status: DC | PRN
  Filled 2017-03-19: qty 177

## 2017-03-19 MED ORDER — CYCLOBENZAPRINE HCL 5 MG PO TABS
5.0000 mg | ORAL_TABLET | Freq: Once | ORAL | Status: AC
  Administered 2017-03-19: 5 mg via ORAL
  Filled 2017-03-19: qty 1

## 2017-03-19 MED ORDER — SODIUM CHLORIDE 0.9% FLUSH: 3.0000 mL | INTRAVENOUS | Status: DC | PRN

## 2017-03-19 MED ORDER — LIDOCAINE HCL 1 % IJ SOLN
INTRAMUSCULAR | Status: AC
  Filled 2017-03-19: qty 20

## 2017-03-19 MED ORDER — MIDAZOLAM HCL 2 MG/2ML IJ SOLN
INTRAMUSCULAR | Status: AC
  Filled 2017-03-19: qty 4

## 2017-03-19 MED ORDER — IOPAMIDOL (ISOVUE-300) INJECTION 61%
INTRAVENOUS | Status: AC
  Administered 2017-03-19: 20 mL
  Filled 2017-03-19: qty 50

## 2017-03-19 MED ORDER — SODIUM CHLORIDE 0.9 % IV SOLN
12.0000 mg | Freq: Once | INTRAVENOUS | Status: AC
  Administered 2017-03-19: 12 mg via INTRAVENOUS
  Filled 2017-03-19: qty 12

## 2017-03-19 MED ORDER — SODIUM CHLORIDE 0.9 % IV SOLN
INTRAVENOUS | Status: DC
  Administered 2017-03-19 – 2017-03-20 (×2): via INTRAVENOUS

## 2017-03-19 MED ORDER — LIDOCAINE HCL 1 % IJ SOLN
INTRAMUSCULAR | Status: AC | PRN
  Administered 2017-03-19: 10 mL

## 2017-03-19 NOTE — Procedures (Signed)
Interventional Radiology Procedure Note  Procedure: Bilateral pulmonary arteriogram, pressure measurements and initiation of bilateral pulmonary thrombolysis  Access: Right CFV, 98F sheath x2  Complications: None  Estimated Blood Loss: NOne  Recommendations: - Initial mean PA pressure 30 mmHg - [place] at 1 mg/hr/catheter x 12 hrs - Sheaths out in am - CBC/fibrinogen/heparin checks per protocol  Signed,  Criselda Peaches, MD

## 2017-03-19 NOTE — Progress Notes (Signed)
Report given to Robin, RN on 34M who will receive pt post procedure to 34M03.  Time given to answer all questions and denies need for additional information.  Can call with questions.

## 2017-03-19 NOTE — Sedation Documentation (Signed)
Patient denies pain and is resting comfortably.  

## 2017-03-19 NOTE — Progress Notes (Signed)
ANTICOAGULATION CONSULT NOTE - Follow Up Consult  Pharmacy Consult for Heparin  Indication: pulmonary embolus  No Known Allergies  Patient Measurements: Height: 5\' 10"  (177.8 cm) Weight: 212 lb 4.9 oz (96.3 kg) IBW/kg (Calculated) : 73  Vital Signs: Temp: 97.8 F (36.6 C) (01/05 1155) Temp Source: Oral (01/05 1155) BP: 111/78 (01/05 1200) Pulse Rate: 95 (01/05 1200)  Labs: Recent Labs    03/18/17 1331 03/18/17 1902 03/19/17 0119 03/19/17 0617 03/19/17 1130  HGB 15.5  --   --  14.1  --   HCT 47.1  --   --  43.7  --   PLT 198  --   --  184  --   HEPARINUNFRC  --   --  0.49  --  0.72*  CREATININE 1.23  --   --   --   --   TROPONINI 0.07* 0.07* 0.04* 0.04*  --     Estimated Creatinine Clearance: 70.6 mL/min (by C-G formula based on SCr of 1.23 mg/dL).   Assessment: Heparin for new onset PE, plan for ECHO today to eval need for EKOS, heparin level supratherapeutic at 0.72 on 1600 units/hr. No bleeding noted.  Goal of Therapy:  Heparin level 0.3-0.7 units/ml Monitor platelets by anticoagulation protocol: Yes   Plan:  Decrease heparin to 1500 units/hr Daily heparin level, CBC  Jose Bowers A Smiley Birr 03/19/2017,12:43 PM

## 2017-03-19 NOTE — Plan of Care (Signed)
Pt with large saddle PE on Heparin gtt.  Plan for echo today.  May have TPA procedure.

## 2017-03-19 NOTE — Sedation Documentation (Signed)
Arterial pressure taken and reads as follows: 51/21 (30) mean

## 2017-03-19 NOTE — Progress Notes (Signed)
ANTICOAGULATION CONSULT NOTE - Follow Up Consult  Pharmacy Consult for Heparin  Indication: pulmonary embolus  No Known Allergies  Patient Measurements: Height: 5\' 10"  (177.8 cm) Weight: 212 lb 4.9 oz (96.3 kg) IBW/kg (Calculated) : 73  Vital Signs: BP: 109/66 (01/05 0015) Pulse Rate: 95 (01/05 0015)  Labs: Recent Labs    03/18/17 1331 03/18/17 1902 03/19/17 0119  HGB 15.5  --   --   HCT 47.1  --   --   PLT 198  --   --   HEPARINUNFRC  --   --  0.49  CREATININE 1.23  --   --   TROPONINI 0.07* 0.07* 0.04*    Estimated Creatinine Clearance: 70.6 mL/min (by C-G formula based on SCr of 1.23 mg/dL).   Assessment: Heparin for new onset PE, plan for ECHO this AM to eval need for EKOS, heparin level therapeutic this AM  Goal of Therapy:  Heparin level 0.3-0.7 units/ml Monitor platelets by anticoagulation protocol: Yes   Plan:  Cont heparin at 1600 units/hr 1200 heparin level  Narda Bonds 03/19/2017,4:23 AM

## 2017-03-19 NOTE — Progress Notes (Signed)
Patient ID: Jose Bowers, male   DOB: 1950/08/21, 67 y.o.   MRN: [id number]    Referring Physician(s): Dr. Pattricia Boss  Supervising Physician: Jacqulynn Cadet  Patient Status: [facility] - In-pt  Chief Complaint: PE  Subjective: Patient is feeling a little better overall today, but still very concerned about the possibility of worsening future pulmonary HTN etc if he does not get the EKOS procedure.    Allergies: Patient has no known allergies.  Medications: Prior to Admission medications   Medication Sig Start Date End Date Taking? Authorizing Provider  amoxicillin (AMOXIL) 500 MG capsule Take 2,000 mg by mouth See admin instructions. Take 4 capsules (2000 mg) by mouth one hour prior to dental appointment (last visit 01/30/17)   Yes [provider]  aspirin EC 81 MG tablet Take 81 mg by mouth at bedtime.   Yes [provider]  atorvastatin (LIPITOR) 10 MG tablet Take 10 mg by mouth daily. 02/14/17  Yes [provider]  fluocinonide cream (LIDEX) 0.08 % Apply 1 application topically daily as needed (itching/rash (Grover's disease)).   Yes [provider]  lisinopril-hydrochlorothiazide (PRINZIDE,ZESTORETIC) 20-12.5 MG tablet Take 1 tablet by mouth daily. 02/14/17  Yes [provider]    Vital Signs: BP 114/85   Pulse (!) 102   Temp 97.8 F (36.6 C) (Oral)   Resp (!) 24   Ht 5\' 10"  (1.778 m)   Wt 212 lb 4.9 oz (96.3 kg)   SpO2 95%   BMI 30.46 kg/m   Physical Exam: Heart: regular, mildly tachy Lungs: CTAB, [place] in place sating 94-97%   Imaging: Dg Chest 2 View  Result Date: 03/18/2017 CLINICAL DATA:  Shortness of breath and chest pain. EXAM: CHEST  2 VIEW COMPARISON:  None. FINDINGS: The lungs are clear. Heart size is upper normal. No pneumothorax or pleural fluid. No bony abnormality. IMPRESSION: No acute disease. Electronically Signed   By: Inge Rise M.D.   On: 03/18/2017 13:58   Ct Angio Chest Pe W And/or Wo  Contrast  Result Date: 03/18/2017 CLINICAL DATA:  Chest pain.  Shortness of breath. EXAM: CT ANGIOGRAPHY CHEST WITH CONTRAST TECHNIQUE: Multidetector CT imaging of the chest was performed using the standard protocol during bolus administration of intravenous contrast. Multiplanar CT image reconstructions and MIPs were obtained to evaluate the vascular anatomy. CONTRAST:  38mL ISOVUE-370 IOPAMIDOL (ISOVUE-370) INJECTION 76% COMPARISON:  Chest radiograph 03/18/2017 FINDINGS: Cardiovascular: Large central bilateral pulmonary emboli. Nearly occlusive left main pulmonary artery embolus, extending to all 3 left-sided lobar arteries, and nonocclusive into the right main pulmonary artery, extending to the right upper lobe and right lower lobar pulmonary arteries, and several segmental branches. Enlargement of the right ventricle with RV/ LV ratio 1.5, evidence of significant right heart strain. Normal caliber of the aorta. Mediastinum/Nodes: No enlarged mediastinal, hilar, or axillary lymph nodes. Thyroid gland, trachea, and esophagus demonstrate no significant findings. Lungs/Pleura: Lungs are clear. No pleural effusion or pneumothorax. Upper Abdomen: No acute abnormality. Musculoskeletal: No chest wall abnormality. No acute or significant osseous findings. Review of the MIP images confirms the above findings. IMPRESSION: Large central bilateral pulmonary embolus, which is nearly occlusive in the left main pulmonary artery extending to all 3 left-sided lobar arterial branches. Nonocclusive right main pulmonary artery embolus extending to the right upper lobar and right lower lobar pulmonary arteries. Heavy clot burden with marked right heart strain with RV/LV ratio of 1.5. Clear lungs. Critical Value/emergent results were called by telephone at the time of interpretation on  03/18/2017 at 4:09 pm to Dr. Pattricia Boss, who verbally acknowledged these results. Electronically Signed   By: Fidela Salisbury M.D.   On:  03/18/2017 16:10    Labs:  CBC: Recent Labs    03/18/17 1331 03/19/17 0617  WBC 8.1 8.8  HGB 15.5 14.1  HCT 47.1 43.7  PLT 198 184    COAGS: No results for input(s): INR, APTT in the last 8760 hours.  BMP: Recent Labs    03/18/17 1331  NA 136  K 4.6  CL 102  CO2 23  GLUCOSE 101*  BUN 18  CALCIUM 9.6  CREATININE 1.23  GFRNONAA >60  GFRAA >60    LIVER FUNCTION TESTS: Recent Labs    03/18/17 1331  BILITOT 2.4*  AST 21  ALT 24  ALKPHOS 63  PROT 7.6  ALBUMIN 3.7    Assessment and Plan: 1. Pulmonary embolus  After a long discussion with the patient as well as Dr. Chase Caller with CCM, it has been decided that the patient would benefit from moving forward with a PE lysis.  However, the patient is improved today from yesterday and the option to continue heparin therapy was given and patient wanted to proceed with EKOS.  The procedure has been thoroughly discussed.  The patient is agreeable with proceeding.   Risks and benefits discussed with the patient including, but not limited to bleeding, possible life threatening bleeding and need for blood product transfusion, vascular injury, stroke, contrast induced renal failure, limb loss and infection. All of the patient's questions were answered, patient is agreeable to proceed. Consent signed and in chart.  Electronically Signed: Henreitta Cea 03/19/2017, 2:11 PM   I spent a total of 25 Minutes at the the patient's bedside AND on the patient's hospital floor or unit, greater than 50% of which was counseling/coordinating care for pulmonary embolus

## 2017-03-19 NOTE — Progress Notes (Signed)
Report from [facility] from [place]. Pt in IR

## 2017-03-19 NOTE — ED Notes (Signed)
No addl blood draw,  Pt moved to inpatient. 

## 2017-03-19 NOTE — Progress Notes (Signed)
PCCM in and talking to patient.

## 2017-03-19 NOTE — Progress Notes (Signed)
Name: Jose Bowers MRN: [id number] DOB: [birth year]    ADMISSION DATE:  03/18/2017 CONSULTATION DATE:  Ernst Breach  REFERRING MD :  Dr. Marcha Dutton EDP  CHIEF COMPLAINT:  SOB  Brief 67 year old male with past medical history as below, which is significant for hypertension, hyperlipidemia, and factor V Leiden.  He does have a history of DVT after shoulder surgery about 5 years ago, which was treated with Xarelto.  Recent course is significant until about 3 weeks ago when he developed left knee pain and was seen in primary care and given a steroid injection.  Reports symptoms resolved at that time.  About 10 days prior to arrival he began to experience dyspnea dyspnea was progressive over the following 10 days occasional chest tightness.  Until he was unable to walk throughout his house or put on his socks without becoming short of breath.  He presented to [facility] emergency department 1/4 with this complaint.  He had a CT angiogram of his chest performed, which demonstrated saddle PE with large clot burden.  RV/LV ratio 1.5.  He denies any recent injury and travel. EDP contacted interventional radiology for consideration of EKOS and PCCM has been asked to see for admission.    has a past medical history of Hypertension.   eVENTS  CT angiogram chest 1/4 > Large central bilateral pulmonary embolus, which is nearly occlusive in the left main pulmonary artery extending to all 3 left-sided lobar arterial branches. Nonocclusive right main pulmonary artery embolus extending to the right upper lobar and right lower lobar pulmonary arteries. Heavy clot burden with marked right heart strain with RV/LV ratio of 1.5.    SUBJECTIVE/OVERNIGHT/INTERVAL HX 03/19/2017  - Room air pulse ox x 15 min 90%, HR 101, SBP 140,. Mentating fine. No confusion. No prior hx of cancer, heart disease, or lung disease. SBP has improved frp, < 100 to > 110 since admit and HR has improved from  > 110 to 102.  Overall PESI score improved from  104 points/class 3 to 84 points/class 2 on IV heparin gtt  However, patient very adamant on getting EKOS   VITAL SIGNS: Temp:  [97.8 F (36.6 C)-99.6 F (37.6 C)] 97.8 F (36.6 C) (01/05 1155) Pulse Rate:  [87-115] 95 (01/05 1200) Resp:  [19-29] 22 (01/05 1200) BP: (93-114)/(66-84) 111/78 (01/05 1200) SpO2:  [92 %-99 %] 96 % (01/05 1200) Weight:  [94.8 kg (209 lb)-96.3 kg (212 lb 4.9 oz)] 96.3 kg (212 lb 4.9 oz) (01/05 0015)  PHYSICAL EXAMINATION:   General Appearance:    Looks well  Head:    Normocephalic, without obvious abnormality, atraumatic  Eyes:    PERRL - yes, conjunctiva/corneas - clear      Ears:    Normal external ear canals, both ears  Nose:   NG tube - no  Throat:  ETT TUBE - no , OG tube - no  Neck:   Supple,  No enlargement/tenderness/nodules     Lungs:     Clear to auscultation bilaterally,   Chest wall:    No deformity  Heart:    S1 and S2 normal, no murmur, CVP - no.  Pressors - no  Abdomen:     Soft, no masses, no organomegaly  Genitalia:    Not done  Rectal:   not done  Extremities:   Extremities- intact     Skin:   Intact in exposed areas . Sacral area - intact     Neurologic:   Sedation - no ->  RASS - 0 . Moves all 4s - yes. CAM-ICU - neg . Orientation - x3+     PULMONARY No results for input(s): PHART, PCO2ART, PO2ART, HCO3, TCO2, O2SAT in the last 168 hours.  Invalid input(s): PCO2, PO2  CBC Recent Labs  Lab 03/18/17 1331 03/19/17 0617  HGB 15.5 14.1  HCT 47.1 43.7  WBC 8.1 8.8  PLT 198 184    COAGULATION No results for input(s): INR in the last 168 hours.  CARDIAC   Recent Labs  Lab 03/18/17 1331 03/18/17 1902 03/19/17 0119 03/19/17 0617  TROPONINI 0.07* 0.07* 0.04* 0.04*   No results for input(s): PROBNP in the last 168 hours.   CHEMISTRY Recent Labs  Lab 03/18/17 1331  NA 136  K 4.6  CL 102  CO2 23  GLUCOSE 101*  BUN 18  CREATININE 1.23  CALCIUM 9.6   Estimated Creatinine Clearance: 70.6 mL/min (by C-G  formula based on SCr of 1.23 mg/dL).   LIVER Recent Labs  Lab 03/18/17 1331  AST 21  ALT 24  ALKPHOS 63  BILITOT 2.4*  PROT 7.6  ALBUMIN 3.7     INFECTIOUS Recent Labs  Lab 03/18/17 1902 03/18/17 2220  LATICACIDVEN 1.0 1.0     ENDOCRINE CBG (last 3)  No results for input(s): GLUCAP in the last 72 hours.       IMAGING x48h  - image(s) personally visualized  -   highlighted in bold Dg Chest 2 View  Result Date: 03/18/2017 CLINICAL DATA:  Shortness of breath and chest pain. EXAM: CHEST  2 VIEW COMPARISON:  None. FINDINGS: The lungs are clear. Heart size is upper normal. No pneumothorax or pleural fluid. No bony abnormality. IMPRESSION: No acute disease. Electronically Signed   By: Inge Rise M.D.   On: 03/18/2017 13:58   Ct Angio Chest Pe W And/or Wo Contrast  Result Date: 03/18/2017 CLINICAL DATA:  Chest pain.  Shortness of breath. EXAM: CT ANGIOGRAPHY CHEST WITH CONTRAST TECHNIQUE: Multidetector CT imaging of the chest was performed using the standard protocol during bolus administration of intravenous contrast. Multiplanar CT image reconstructions and MIPs were obtained to evaluate the vascular anatomy. CONTRAST:  71mL ISOVUE-370 IOPAMIDOL (ISOVUE-370) INJECTION 76% COMPARISON:  Chest radiograph 03/18/2017 FINDINGS: Cardiovascular: Large central bilateral pulmonary emboli. Nearly occlusive left main pulmonary artery embolus, extending to all 3 left-sided lobar arteries, and nonocclusive into the right main pulmonary artery, extending to the right upper lobe and right lower lobar pulmonary arteries, and several segmental branches. Enlargement of the right ventricle with RV/ LV ratio 1.5, evidence of significant right heart strain. Normal caliber of the aorta. Mediastinum/Nodes: No enlarged mediastinal, hilar, or axillary lymph nodes. Thyroid gland, trachea, and esophagus demonstrate no significant findings. Lungs/Pleura: Lungs are clear. No pleural effusion or  pneumothorax. Upper Abdomen: No acute abnormality. Musculoskeletal: No chest wall abnormality. No acute or significant osseous findings. Review of the MIP images confirms the above findings. IMPRESSION: Large central bilateral pulmonary embolus, which is nearly occlusive in the left main pulmonary artery extending to all 3 left-sided lobar arterial branches. Nonocclusive right main pulmonary artery embolus extending to the right upper lobar and right lower lobar pulmonary arteries. Heavy clot burden with marked right heart strain with RV/LV ratio of 1.5. Clear lungs. Critical Value/emergent results were called by telephone at the time of interpretation on 03/18/2017 at 4:09 pm to Dr. Pattricia Boss, who verbally acknowledged these results. Electronically Signed   By: Fidela Salisbury M.D.   On: 03/18/2017 16:10  ASSESSMENT / PLAN:  Pulmonary embolism: large saddle PE occlusive on the L. Large clot burden. RV/LV ratio 1.5. In the setting of Factor V Leiden.  PESI score 3 at admit 03/18/2017 and with IV heparin - PESI 2 on 03/19/2017 . Has DVT    I had a 30 minute discussion with him, wife, in presence of RN and also Kelli of IR. Explained that he has improved and clot blurden better and for CLass 2 PESI my rec is to continue IV heparin gtt for 3-5 dauys and then to Xarelto life long. But use EKOS as rescue. He is not interested. He says his risk for major bleeding is low (though I told him is mutli X higher than Iv heparin). He also says that he is at risk for pulm hthn CTEPH eso with large clot burden and factor 5 leydin deficiiency  And therefore he feels EKOS is superior. I tried to counter saying that is rare outcome and is lysis superiority is not based on evidence but he would not listen. Therefore,   Plan: Continue heparin IV gtt For EKOS 03/19/2017 - Echocardiogram Supplemental O2 for pulse ox > 88% Will likely need life-long anticoagulation in the setting of second clot and Factor  V  Hypertension - Holding lisinopril/HCTZ - Continue asa 81mg   Hyperlipidemia - Continue atorvastatin  Diet:  NPOVTE ppx: full dose heparin Dispo: SDU Code: FULL   > 50% of this > 40 min visit spent in face to face counseling or/and coordination of care    Dr. Brand Males, M.D., [facility].C.P Pulmonary and Critical Care Medicine Staff Physician, [place] Director - Interstitial Lung Disease  Program  Pulmonary [place] at [place], [place], [address]  Pager: [phone], If no answer or between  15:00h - 7:00h: call [phone] Telephone: [phone]

## 2017-03-19 NOTE — Progress Notes (Signed)
Per discussion with MD's and all risk factors, benefits and alternatives being discussed with pt, pt has decided to proceed with lysis of embolism with TPA infusion procedure. Will be transferred to ICU post procedure.

## 2017-03-19 NOTE — H&P (Signed)
Chief Complaint: Patient was seen in consultation today for     Chief Complaint  Patient presents with  . Shortness of Breath    Referring Physician(s): ED  Supervising Physician: Jacqulynn Cadet  Patient Status: [facility] - ED  History of Present Illness: Jose Bowers is a 67 y.o. male with Factor V Leiden presented to the ED today with SOB.  His father also has Factor V Leiden.  He states he has had some knee pain and has not been as active as he usually is. He did have steroid injection in his knee a few weeks ago.  He states he had shoulder surgery several years ago and developed a DVT in his arm. He was on blood thinners for 3 months then he was taken off of them.  He denies recent long travel, car nor a plane.   He currently stable. Sats were 92% on room air initially, but now he is on 3 liters [place] and is 97%.  He ate a power bar 2 hours ago.  He denies any bleeding risk. No recent surgeries. No GI bleeding. No history of intracranial bleeding.      Past Medical History:  Diagnosis Date  . Hypertension          Past Surgical History:  Procedure Laterality Date  . HERNIA REPAIR    . JOINT REPLACEMENT    . ROTATOR CUFF REPAIR      Allergies: Patient has no allergy information on record.  Medications: Prior to Admission medications   Not on File     No family history on file.  Social History        Socioeconomic History  . Marital status: Married    Spouse name: None  . Number of children: None  . Years of education: None  . Highest education level: None  Social Needs  . Financial resource strain: None  . Food insecurity - worry: None  . Food insecurity - inability: None  . Transportation needs - medical: None  . Transportation needs - non-medical: None  Occupational History  . None  Tobacco Use  . Smoking status: Never Smoker  . Smokeless tobacco: Never Used  Substance and Sexual Activity  . Alcohol  use: Yes    Comment: "few times a week"  . Drug use: No  . Sexual activity: None  Other Topics Concern  . None  Social History Narrative  . None     Review of Systems: A 12 point ROS discussed and pertinent positives are indicated in the HPI above.  All other systems are negative.  Review of Systems  Cardiovascular: Positive for chest pain.  Genitourinary: Positive for discharge.    Vital Signs: BP 101/72   Pulse (!) 111   Temp 99.6 F (37.6 C) (Oral)   Resp 20   Ht 5\' 10"  (1.778 m)   Wt 209 lb (94.8 kg)   SpO2 97%   BMI 29.99 kg/m   Physical Exam  Constitutional: He is oriented to person, place, and time. He appears well-developed.  HENT:  Head: Normocephalic and atraumatic.  Eyes: EOM are normal.  Neck: Normal range of motion.  Cardiovascular: Normal rate, regular rhythm and normal heart sounds.  Pulmonary/Chest: Effort normal. No respiratory distress. He has no wheezes.  Abdominal: Soft. He exhibits no distension.  Musculoskeletal: Normal range of motion.  Neurological: He is alert and oriented to person, place, and time.  Skin: Skin is warm and dry.  Psychiatric: He has a normal  mood and affect. His behavior is normal. Judgment and thought content normal.  Vitals reviewed.   Imaging:  ImagingResults  Dg Chest 2 View  Result Date: 03/18/2017 CLINICAL DATA:  Shortness of breath and chest pain. EXAM: CHEST  2 VIEW COMPARISON:  None. FINDINGS: The lungs are clear. Heart size is upper normal. No pneumothorax or pleural fluid. No bony abnormality. IMPRESSION: No acute disease. Electronically Signed   By: Inge Rise M.D.   On: 03/18/2017 13:58   Ct Angio Chest Pe W And/or Wo Contrast  Result Date: 03/18/2017 CLINICAL DATA:  Chest pain.  Shortness of breath. EXAM: CT ANGIOGRAPHY CHEST WITH CONTRAST TECHNIQUE: Multidetector CT imaging of the chest was performed using the standard protocol during bolus administration of intravenous contrast.  Multiplanar CT image reconstructions and MIPs were obtained to evaluate the vascular anatomy. CONTRAST:  64mL ISOVUE-370 IOPAMIDOL (ISOVUE-370) INJECTION 76% COMPARISON:  Chest radiograph 03/18/2017 FINDINGS: Cardiovascular: Large central bilateral pulmonary emboli. Nearly occlusive left main pulmonary artery embolus, extending to all 3 left-sided lobar arteries, and nonocclusive into the right main pulmonary artery, extending to the right upper lobe and right lower lobar pulmonary arteries, and several segmental branches. Enlargement of the right ventricle with RV/ LV ratio 1.5, evidence of significant right heart strain. Normal caliber of the aorta. Mediastinum/Nodes: No enlarged mediastinal, hilar, or axillary lymph nodes. Thyroid gland, trachea, and esophagus demonstrate no significant findings. Lungs/Pleura: Lungs are clear. No pleural effusion or pneumothorax. Upper Abdomen: No acute abnormality. Musculoskeletal: No chest wall abnormality. No acute or significant osseous findings. Review of the MIP images confirms the above findings. IMPRESSION: Large central bilateral pulmonary embolus, which is nearly occlusive in the left main pulmonary artery extending to all 3 left-sided lobar arterial branches. Nonocclusive right main pulmonary artery embolus extending to the right upper lobar and right lower lobar pulmonary arteries. Heavy clot burden with marked right heart strain with RV/LV ratio of 1.5. Clear lungs. Critical Value/emergent results were called by telephone at the time of interpretation on 03/18/2017 at 4:09 pm to Dr. Pattricia Boss, who verbally acknowledged these results. Electronically Signed   By: Fidela Salisbury M.D.   On: 03/18/2017 16:10     Labs:  CBC: RecentLabs(withinlast365days)     Recent Labs    03/18/17 1331  WBC 8.1  HGB 15.5  HCT 47.1  PLT 198      COAGS: RecentLabs(withinlast365days)  No results for input(s): INR, APTT in the last 8760 hours.     BMP: RecentLabs(withinlast365days)  Recent Labs    03/18/17 1331  NA 136  K 4.6  CL 102  CO2 23  GLUCOSE 101*  BUN 18  CALCIUM 9.6  CREATININE 1.23  GFRNONAA >60  GFRAA >60      LIVER FUNCTION TESTS: RecentLabs(withinlast365days)     Recent Labs    03/18/17 1331  BILITOT 2.4*  AST 21  ALT 24  ALKPHOS 63  PROT 7.6  ALBUMIN 3.7      TUMOR MARKERS: RecentLabs(withinlast365days)  No results for input(s): AFPTM, CEA, CA199, CHROMGRNA in the last 8760 hours.    Assessment and Plan:  Acute massive PE in the setting Factor V Leiden   Will proceed with initiation of catheter directed lysis by Dr. Laurence Ferrari.  Risks and benefits discussed with the patient including, but not limited to bleeding, possible life threatening bleeding and need for blood product transfusion, vascular injury, stroke, contrast induced renal failure, limb loss and infection.  All of the patient's questions were answered, patient is  agreeable to proceed. Consent signed and in chart.  ADDENDUM:  Wendy's note was taken over and signed by not an attending provider.  Her original note has been deleted and this one is her original note and has been copied and pasted so it can be routed to Dr. Laurence Ferrari for his signature and addendum. Henreitta Cea 12:42 PM 03/19/2017    Electronically Signed: Murrell Redden, PA-C 03/18/2017, 5:18 PM   I spent a total of 40 Minutes in face to face in clinical consultation, greater than 50% of which was counseling/coordinating care for PE lysis.

## 2017-03-19 NOTE — Progress Notes (Signed)
Pt taken to IR via bed by SWOT RN.  No s/s of distress.  Respirations even and unlabored.

## 2017-03-19 NOTE — Progress Notes (Signed)
Pt concerned about when echo would be completed as he may need a procedure based on results.  Echo ordered routine.  NP on record paged to ask about echo and procedure as pt is NPO and waiting for decision.

## 2017-03-19 NOTE — Progress Notes (Signed)
VASCULAR LAB PRELIMINARY  PRELIMINARY  PRELIMINARY  PRELIMINARY  Bilateral lower extremity venous duplex completed.    Preliminary report:  There is acute DVT noted in the left posterior tibial, peroneal, popliteal, and mid to distal femoral veins.    Weslyn Holsonback, RVT 03/19/2017, 8:43 AM

## 2017-03-19 NOTE — Progress Notes (Signed)
[facility] Progress Note Patient Name: Jose Bowers DOB: 11-Dec-1950 MRN: [id number]   Date of Service  03/19/2017  HPI/Events of Note  Pt requests flexeril, says dilaudid and valium ineff4ective. Low dose ordered x1  eICU Interventions       Intervention Category Intermediate Interventions: Other:  Collene Gobble 03/19/2017, 8:01 PM

## 2017-03-20 ENCOUNTER — Encounter (HOSPITAL_COMMUNITY): Payer: Self-pay | Admitting: Interventional Radiology

## 2017-03-20 ENCOUNTER — Inpatient Hospital Stay (HOSPITAL_COMMUNITY): Payer: BLUE CROSS/BLUE SHIELD

## 2017-03-20 DIAGNOSIS — I82412 Acute embolism and thrombosis of left femoral vein: Secondary | ICD-10-CM

## 2017-03-20 DIAGNOSIS — I2602 Saddle embolus of pulmonary artery with acute cor pulmonale: Principal | ICD-10-CM

## 2017-03-20 HISTORY — PX: IR THROMB F/U EVAL ART/VEN FINAL DAY (MS): IMG5379

## 2017-03-20 LAB — FIBRINOGEN
FIBRINOGEN: 437 mg/dL (ref 210–475)
FIBRINOGEN: 488 mg/dL — AB (ref 210–475)
Fibrinogen: 451 mg/dL (ref 210–475)

## 2017-03-20 LAB — BASIC METABOLIC PANEL
Anion gap: 6 (ref 5–15)
BUN: 16 mg/dL (ref 6–20)
CO2: 22 mmol/L (ref 22–32)
Calcium: 8.2 mg/dL — ABNORMAL LOW (ref 8.9–10.3)
Chloride: 104 mmol/L (ref 101–111)
Creatinine, Ser: 1.03 mg/dL (ref 0.61–1.24)
Glucose, Bld: 93 mg/dL (ref 65–99)
POTASSIUM: 4 mmol/L (ref 3.5–5.1)
SODIUM: 132 mmol/L — AB (ref 135–145)

## 2017-03-20 LAB — CBC
HEMATOCRIT: 39.8 % (ref 39.0–52.0)
HEMATOCRIT: 40.1 % (ref 39.0–52.0)
HEMATOCRIT: 39.5 % (ref 39.0–52.0)
HEMOGLOBIN: 12.8 g/dL — AB (ref 13.0–17.0)
HEMOGLOBIN: 13 g/dL (ref 13.0–17.0)
Hemoglobin: 12.6 g/dL — ABNORMAL LOW (ref 13.0–17.0)
MCH: 29.3 pg (ref 26.0–34.0)
MCH: 29.8 pg (ref 26.0–34.0)
MCH: 30 pg (ref 26.0–34.0)
MCHC: 31.9 g/dL (ref 30.0–36.0)
MCHC: 32.2 g/dL (ref 30.0–36.0)
MCHC: 32.4 g/dL (ref 30.0–36.0)
MCV: 91.9 fL (ref 78.0–100.0)
MCV: 92.6 fL (ref 78.0–100.0)
MCV: 92.8 fL (ref 78.0–100.0)
Platelets: 135 10*3/uL — ABNORMAL LOW (ref 150–400)
Platelets: 137 10*3/uL — ABNORMAL LOW (ref 150–400)
Platelets: 153 10*3/uL (ref 150–400)
RBC: 4.3 MIL/uL (ref 4.22–5.81)
RBC: 4.29 MIL/uL (ref 4.22–5.81)
RBC: 4.33 MIL/uL (ref 4.22–5.81)
RDW: 12.6 % (ref 11.5–15.5)
RDW: 12.7 % (ref 11.5–15.5)
RDW: 12.7 % (ref 11.5–15.5)
WBC: 6.3 10*3/uL (ref 4.0–10.5)
WBC: 8.2 10*3/uL (ref 4.0–10.5)
WBC: 9 10*3/uL (ref 4.0–10.5)

## 2017-03-20 LAB — PHOSPHORUS: PHOSPHORUS: 3.3 mg/dL (ref 2.5–4.6)

## 2017-03-20 LAB — MAGNESIUM: MAGNESIUM: 1.6 mg/dL — AB (ref 1.7–2.4)

## 2017-03-20 LAB — TROPONIN I: TROPONIN I: 0.04 ng/mL — AB (ref <0.03)

## 2017-03-20 LAB — HEPARIN LEVEL (UNFRACTIONATED)
HEPARIN UNFRACTIONATED: 0.3 [IU]/mL (ref 0.30–0.70)
Heparin Unfractionated: 0.45 IU/mL (ref 0.30–0.70)
Heparin Unfractionated: 0.4 IU/mL (ref 0.30–0.70)

## 2017-03-20 MED ORDER — MUSCLE RUB 10-15 % EX CREA
TOPICAL_CREAM | CUTANEOUS | Status: DC | PRN
Start: 2017-03-20 — End: 2017-03-22
  Filled 2017-03-20: qty 85

## 2017-03-20 MED ORDER — POLYETHYLENE GLYCOL 3350 17 G PO PACK
17.0000 g | PACK | Freq: Every day | ORAL | Status: DC
  Filled 2017-03-20 (×3): qty 1

## 2017-03-20 MED ORDER — DEXTROMETHORPHAN POLISTIREX ER 30 MG/5ML PO SUER
30.0000 mg | Freq: Two times a day (BID) | ORAL | Status: DC | PRN
Start: 2017-03-20 — End: 2017-03-22
  Administered 2017-03-20 – 2017-03-21 (×2): 30 mg via ORAL
  Filled 2017-03-20 (×6): qty 5

## 2017-03-20 MED ORDER — FAMOTIDINE 20 MG PO TABS: 20.0000 mg | ORAL_TABLET | Freq: Two times a day (BID) | ORAL | Status: DC | PRN

## 2017-03-20 MED ORDER — MAGNESIUM SULFATE 2 GM/50ML IV SOLN
2.0000 g | Freq: Once | INTRAVENOUS | Status: AC
  Administered 2017-03-20: 2 g via INTRAVENOUS
  Filled 2017-03-20: qty 50

## 2017-03-20 NOTE — Progress Notes (Signed)
ANTICOAGULATION CONSULT NOTE - Follow Up Consult  Pharmacy Consult for Heparin  Indication: pulmonary embolus  No Known Allergies  Patient Measurements: Height: 5\' 10"  (177.8 cm) Weight: 212 lb 4.9 oz (96.3 kg) IBW/kg (Calculated) : 73  Vital Signs: Temp: 99.2 F (37.3 C) (01/06 1200) Temp Source: Oral (01/06 1200) BP: 120/74 (01/06 1200) Pulse Rate: 84 (01/06 1200)  Labs: Recent Labs    03/18/17 1331  03/19/17 0119 03/19/17 0617 03/19/17 1130 03/19/17 1840 03/19/17 1841 03/20/17 0034 03/20/17 0718  HGB 15.5  --   --  14.1  --   --  13.9 13.0 12.8*  HCT 47.1  --   --  43.7  --   --  43.1 40.1 39.8  PLT 198  --   --  184  --   --  187 153 137*  HEPARINUNFRC  --    < > 0.49  --  0.72* 0.47  --   --  0.45  CREATININE 1.23  --   --   --   --   --   --  1.03  --   TROPONINI 0.07*   < > 0.04* 0.04*  --   --   --   --  0.04*   < > = values in this interval not displayed.    Estimated Creatinine Clearance: 84.3 mL/min (by C-G formula based on SCr of 1.03 mg/dL).  Assessment: Heparin for new onset PE, plan for ECHO today to eval need for EKOS,   Heparin level therapeutic.45, HgB stable, slight downtrend in platelets 187>137; no bleeding documented, plan to transition from heparin to DOAC in 24-48 hours  Goal of Therapy:  Heparin level 0.3-0.7 units/ml Monitor platelets by anticoagulation protocol: Yes   Plan:  Continue heparin to 1500 units/hr Daily heparin level, CBC Monitor for s/sx of bleeding  Mairen Wallenstein L Marny Smethers 03/20/2017,1:05 PM

## 2017-03-20 NOTE — Progress Notes (Signed)
Patient ID: Jose Bowers, male   DOB: Oct 10, 1950, 67 y.o.   MRN: [id number]    Referring Physician(s): Dr. Pattricia Boss  Supervising Physician: Jacqulynn Cadet  Patient Status: [facility] - In-pt  Chief Complaint: PE  Subjective: Patient complains of loose cough this morning with a runny nose.  States he had some bloody nasal discharge and bloody sputum this am, but has resolved.  Still complains of some back pain from spasms.    Allergies: Patient has no known allergies.  Medications: Prior to Admission medications   Medication Sig Start Date End Date Taking? Authorizing Provider  amoxicillin (AMOXIL) 500 MG capsule Take 2,000 mg by mouth See admin instructions. Take 4 capsules (2000 mg) by mouth one hour prior to dental appointment (last visit 01/30/17)   Yes [provider]  aspirin EC 81 MG tablet Take 81 mg by mouth at bedtime.   Yes [provider]  atorvastatin (LIPITOR) 10 MG tablet Take 10 mg by mouth daily. 02/14/17  Yes [provider]  fluocinonide cream (LIDEX) 3.61 % Apply 1 application topically daily as needed (itching/rash (Grover's disease)).   Yes [provider]  lisinopril-hydrochlorothiazide (PRINZIDE,ZESTORETIC) 20-12.5 MG tablet Take 1 tablet by mouth daily. 02/14/17  Yes [provider]    Vital Signs: BP 130/80   Pulse 95   Temp 100 F (37.8 C) (Oral)   Resp (!) 21   Ht 5\' 10"  (1.778 m)   Wt 212 lb 4.9 oz (96.3 kg)   SpO2 98%   BMI 30.46 kg/m   Physical Exam: Chest: CTAB, O2 sats on [place] are improved from yesterday to 99-100%. Heart: mildly tach, but regular Skin: sheathes in place in right groin.  No bleeding  Imaging: Dg Chest 1 View  Result Date: 03/19/2017 CLINICAL DATA:  Pulmonary embolism post bilateral pulmonary angiogram. Abnormal respirations. History of hypertension. EXAM: CHEST 1 VIEW COMPARISON:  One 4 tooth FINDINGS: Infusion catheter is demonstrated in the pulmonary artery's bilaterally.  Shallow inspiration. Cardiac enlargement. Pulmonary vascularity is normal. No airspace disease or consolidation in the lungs. No blunting of costophrenic angles. No pneumothorax. IMPRESSION: Cardiac enlargement. Shallow inspiration. No evidence of active pulmonary disease. Electronically Signed   By: Lucienne Capers M.D.   On: 03/19/2017 19:08   Dg Chest 2 View  Result Date: 03/18/2017 CLINICAL DATA:  Shortness of breath and chest pain. EXAM: CHEST  2 VIEW COMPARISON:  None. FINDINGS: The lungs are clear. Heart size is upper normal. No pneumothorax or pleural fluid. No bony abnormality. IMPRESSION: No acute disease. Electronically Signed   By: Inge Rise M.D.   On: 03/18/2017 13:58   Ct Angio Chest Pe W And/or Wo Contrast  Result Date: 03/18/2017 CLINICAL DATA:  Chest pain.  Shortness of breath. EXAM: CT ANGIOGRAPHY CHEST WITH CONTRAST TECHNIQUE: Multidetector CT imaging of the chest was performed using the standard protocol during bolus administration of intravenous contrast. Multiplanar CT image reconstructions and MIPs were obtained to evaluate the vascular anatomy. CONTRAST:  62mL ISOVUE-370 IOPAMIDOL (ISOVUE-370) INJECTION 76% COMPARISON:  Chest radiograph 03/18/2017 FINDINGS: Cardiovascular: Large central bilateral pulmonary emboli. Nearly occlusive left main pulmonary artery embolus, extending to all 3 left-sided lobar arteries, and nonocclusive into the right main pulmonary artery, extending to the right upper lobe and right lower lobar pulmonary arteries, and several segmental branches. Enlargement of the right ventricle with RV/ LV ratio 1.5, evidence of significant right heart strain. Normal caliber of the aorta. Mediastinum/Nodes: No enlarged mediastinal, hilar, or axillary lymph nodes. Thyroid  gland, trachea, and esophagus demonstrate no significant findings. Lungs/Pleura: Lungs are clear. No pleural effusion or pneumothorax. Upper Abdomen: No acute abnormality. Musculoskeletal: No chest  wall abnormality. No acute or significant osseous findings. Review of the MIP images confirms the above findings. IMPRESSION: Large central bilateral pulmonary embolus, which is nearly occlusive in the left main pulmonary artery extending to all 3 left-sided lobar arterial branches. Nonocclusive right main pulmonary artery embolus extending to the right upper lobar and right lower lobar pulmonary arteries. Heavy clot burden with marked right heart strain with RV/LV ratio of 1.5. Clear lungs. Critical Value/emergent results were called by telephone at the time of interpretation on 03/18/2017 at 4:09 pm to Dr. Pattricia Boss, who verbally acknowledged these results. Electronically Signed   By: Fidela Salisbury M.D.   On: 03/18/2017 16:10   Ir Angiogram Pulmonary Bilateral Selective  Result Date: 03/19/2017 INDICATION: 67 year old male with acute sub massive pulmonary embolism. He presents for catheter directed bilateral pulmonary artery thrombolysis. EXAM: IR INFUSION THROMBOL ARTERIAL INITIAL (MS); BILATERAL PULMONARY ARTERIOGRAPHY; IR ULTRASOUND GUIDANCE VASC ACCESS RIGHT; ADDITIONAL ARTERIOGRAPHY COMPARISON:  CTA chest 03/18/2017 MEDICATIONS: None. ANESTHESIA/SEDATION: Versed 2 mg IV; Fentanyl 100 mcg IV Moderate Sedation Time:  30 minutes The patient was continuously monitored during the procedure by the interventional radiology nurse under my direct supervision. FLUOROSCOPY TIME:  Fluoroscopy Time: 10 minutes 36 seconds (77 mGy). COMPLICATIONS: None immediate. TECHNIQUE: Informed written consent was obtained from the patient after a thorough discussion of the procedural risks, benefits and alternatives. All questions were addressed. Maximal Sterile Barrier Technique was utilized including caps, mask, sterile gowns, sterile gloves, sterile drape, hand hygiene and skin antiseptic. A timeout was performed prior to the initiation of the procedure. The right common femoral vein was interrogated with ultrasound and  found to be widely patent. An image was obtained and stored for the medical record. Local anesthesia was attained by infiltration with 1% lidocaine. A small dermatotomy was made. Under real-time sonographic guidance, the vessel was punctured with a 21 gauge micropuncture needle. Using standard technique, the initial micro needle was exchanged over a 0.018 micro wire for a transitional 4 [place] micro sheath. The micro sheath was then exchanged over a 0.035 wire for a 6 French vascular sheath. Using the exact same technique, a second 6 [place] vascular sheath was also advanced into the right common femoral artery adjacent to the first. Through the more medial sheath, a C2 cobra catheter was advanced over a Bentson wire into the right atrium, through the right ventricle and into the main pulmonary artery. A main pulmonary arteriogram was performed. The pulmonary arterial pressure was 52/21 (30) mmHg. A main pulmonary arteriogram was performed. The pulmonary arteries are grossly enlarged. Filling defects are present bilaterally. The C2 cobra catheter was then exchanged over a Rosen wire for a a 100 cm Vert catheter. The vert catheter was successfully navigated into the right main pulmonary artery. A right pulmonary arteriogram was performed. This demonstrates nearly occlusive thrombus and demonstrates the anatomy. The catheter was then successfully navigated into the right lower lobe pulmonary artery. A Rosen wire was placed. A 12 cm EKOS infusion catheter was then advanced over the wire. Through the more lateral sheath, the C2 cobra catheter was reintroduced over a Bentson wire and again navigated into the main pulmonary artery. The C2 cobra catheter was then exchanged for a 100 cm for 2 catheter. The vert catheter was successfully navigated into the left main pulmonary artery. A pulmonary arteriogram was performed confirming  nearly occlusive thrombus. The catheter was successfully navigated into the left lower lobe  pulmonary artery. A Rosen wire was placed. The catheter was removed. An 18 cm EKOS infusion catheter was advanced over the wire and positioned. This sheaths were secured to the skin with 0 silk suture. Thrombolysis was initiated at 1 mg/hr tPA through each catheter for a total of 2 milligrams/hour. FINDINGS: Pulmonary arterial pressure 52/21 (30) mmHg IMPRESSION: 1. Pulmonary arterial hypertension with bilateral large volume pulmonary emboli. 2. Successful initiation of bilateral pulmonary artery thrombolysis. Signed, Criselda Peaches, MD [facility] Radiology Electronically Signed   By: Jacqulynn Cadet M.D.   On: 03/19/2017 16:37   Ir Angiogram Selective Each Additional Vessel  Result Date: 03/19/2017 INDICATION: 67 year old male with acute sub massive pulmonary embolism. He presents for catheter directed bilateral pulmonary artery thrombolysis. EXAM: ADDITIONAL ARTERIOGRAPHY COMPARISON:  CTA chest 03/18/2017 MEDICATIONS: None. ANESTHESIA/SEDATION: Versed 2 mg IV; Fentanyl 100 mcg IV Moderate Sedation Time: 30 minutes The patient was continuously monitored during the procedure by the interventional radiology nurse under my direct supervision. FLUOROSCOPY TIME:  Fluoroscopy Time: 10 minutes 36 seconds (77 mGy). COMPLICATIONS: None immediate. TECHNIQUE: Informed written consent was obtained from the patient after a thorough discussion of the procedural risks, benefits and alternatives. All questions were addressed. Maximal Sterile Barrier Technique was utilized including caps, mask, sterile gowns, sterile gloves, sterile drape, hand hygiene and skin antiseptic. A timeout was performed prior to the initiation of the procedure. The right common femoral vein was interrogated with ultrasound and found to be widely patent. An image was obtained and stored for the medical record. Local anesthesia was attained by infiltration with 1% lidocaine. A small dermatotomy was  made. Under real-time sonographic guidance, the vessel was punctured with a 21 gauge micropuncture needle. Using standard technique, the initial micro needle was exchanged over a 0.018 micro wire for a transitional 4 [place] micro sheath. The micro sheath was then exchanged over a 0.035 wire for a 6 French vascular sheath. Using the exact same technique, a second 6 [place] vascular sheath was also advanced into the right common femoral artery adjacent to the first. Through the more medial sheath, a C2 cobra catheter was advanced over a Bentson wire into the right atrium, through the right ventricle and into the main pulmonary artery. A main pulmonary arteriogram was performed. The pulmonary arterial pressure was 52/21 (30) mmHg. A main pulmonary arteriogram was performed. The pulmonary arteries are grossly enlarged. Filling defects are present bilaterally. The C2 cobra catheter was then exchanged over a Rosen wire for a a 100 cm Vert catheter. The vert catheter was successfully navigated into the right main pulmonary artery. A right pulmonary arteriogram was performed. This demonstrates nearly occlusive thrombus and demonstrates the anatomy. The catheter was then successfully navigated into the right lower lobe pulmonary artery. A Rosen wire was placed. A 12 cm EKOS infusion catheter was then advanced over the wire. Through the more lateral sheath, the C2 cobra catheter was reintroduced over a Bentson wire and again navigated into the main pulmonary artery. The C2 cobra catheter was then exchanged for a 100 cm for 2 catheter. The vert catheter was successfully navigated into the left main pulmonary artery. A pulmonary arteriogram was performed confirming nearly occlusive thrombus. The catheter was successfully navigated into the left lower lobe pulmonary artery. A Rosen wire was placed. The catheter was removed. An 18 cm EKOS infusion catheter was advanced over the wire and positioned. This  sheaths were secured to the  skin with 0 silk suture. Thrombolysis was initiated at 1 mg/hr tPA through each catheter for a total of 2 milligrams/hour. FINDINGS: Pulmonary arterial pressure 52/21 (30) mmHg IMPRESSION: 1. Pulmonary arterial hypertension with bilateral large volume pulmonary emboli. 2. Successful initiation of bilateral pulmonary artery thrombolysis. Electronically Signed   By: Jacqulynn Cadet M.D.   On: 03/19/2017 16:44   Ir Angiogram Selective Each Additional Vessel  Result Date: 03/19/2017 INDICATION: 67 year old male with acute sub massive pulmonary embolism. He presents for catheter directed bilateral pulmonary artery thrombolysis. EXAM: IR INFUSION THROMBOL ARTERIAL INITIAL (MS); BILATERAL PULMONARY ARTERIOGRAPHY; IR ULTRASOUND GUIDANCE VASC ACCESS RIGHT; ADDITIONAL ARTERIOGRAPHY COMPARISON:  CTA chest 03/18/2017 MEDICATIONS: None. ANESTHESIA/SEDATION: Versed 2 mg IV; Fentanyl 100 mcg IV Moderate Sedation Time:  30 minutes The patient was continuously monitored during the procedure by the interventional radiology nurse under my direct supervision. FLUOROSCOPY TIME:  Fluoroscopy Time: 10 minutes 36 seconds (77 mGy). COMPLICATIONS: None immediate. TECHNIQUE: Informed written consent was obtained from the patient after a thorough discussion of the procedural risks, benefits and alternatives. All questions were addressed. Maximal Sterile Barrier Technique was utilized including caps, mask, sterile gowns, sterile gloves, sterile drape, hand hygiene and skin antiseptic. A timeout was performed prior to the initiation of the procedure. The right common femoral vein was interrogated with ultrasound and found to be widely patent. An image was obtained and stored for the medical record. Local anesthesia was attained by infiltration with 1% lidocaine. A small dermatotomy was made. Under real-time sonographic guidance, the vessel was punctured with a 21 gauge micropuncture needle. Using standard technique, the initial micro needle  was exchanged over a 0.018 micro wire for a transitional 4 [place] micro sheath. The micro sheath was then exchanged over a 0.035 wire for a 6 French vascular sheath. Using the exact same technique, a second 6 [place] vascular sheath was also advanced into the right common femoral artery adjacent to the first. Through the more medial sheath, a C2 cobra catheter was advanced over a Bentson wire into the right atrium, through the right ventricle and into the main pulmonary artery. A main pulmonary arteriogram was performed. The pulmonary arterial pressure was 52/21 (30) mmHg. A main pulmonary arteriogram was performed. The pulmonary arteries are grossly enlarged. Filling defects are present bilaterally. The C2 cobra catheter was then exchanged over a Rosen wire for a a 100 cm Vert catheter. The vert catheter was successfully navigated into the right main pulmonary artery. A right pulmonary arteriogram was performed. This demonstrates nearly occlusive thrombus and demonstrates the anatomy. The catheter was then successfully navigated into the right lower lobe pulmonary artery. A Rosen wire was placed. A 12 cm EKOS infusion catheter was then advanced over the wire. Through the more lateral sheath, the C2 cobra catheter was reintroduced over a Bentson wire and again navigated into the main pulmonary artery. The C2 cobra catheter was then exchanged for a 100 cm for 2 catheter. The vert catheter was successfully navigated into the left main pulmonary artery. A pulmonary arteriogram was performed confirming nearly occlusive thrombus. The catheter was successfully navigated into the left lower lobe pulmonary artery. A Rosen wire was placed. The catheter was removed. An 18 cm EKOS infusion catheter was advanced over the wire and positioned. This sheaths were secured to the skin with 0 silk suture. Thrombolysis was initiated at 1 mg/hr tPA through each catheter for a total of 2 milligrams/hour. FINDINGS: Pulmonary arterial  pressure 52/21 (30) mmHg  IMPRESSION: 1. Pulmonary arterial hypertension with bilateral large volume pulmonary emboli. 2. Successful initiation of bilateral pulmonary artery thrombolysis. Signed, Criselda Peaches, MD [facility] Radiology Electronically Signed   By: Jacqulynn Cadet M.D.   On: 03/19/2017 16:37   Ir US Guide Vasc Access Right  Result Date: 03/19/2017 INDICATION: 67 year old male with acute sub massive pulmonary embolism. He presents for catheter directed bilateral pulmonary artery thrombolysis. EXAM: IR INFUSION THROMBOL ARTERIAL INITIAL (MS); BILATERAL PULMONARY ARTERIOGRAPHY; IR ULTRASOUND GUIDANCE VASC ACCESS RIGHT; ADDITIONAL ARTERIOGRAPHY COMPARISON:  CTA chest 03/18/2017 MEDICATIONS: None. ANESTHESIA/SEDATION: Versed 2 mg IV; Fentanyl 100 mcg IV Moderate Sedation Time:  30 minutes The patient was continuously monitored during the procedure by the interventional radiology nurse under my direct supervision. FLUOROSCOPY TIME:  Fluoroscopy Time: 10 minutes 36 seconds (77 mGy). COMPLICATIONS: None immediate. TECHNIQUE: Informed written consent was obtained from the patient after a thorough discussion of the procedural risks, benefits and alternatives. All questions were addressed. Maximal Sterile Barrier Technique was utilized including caps, mask, sterile gowns, sterile gloves, sterile drape, hand hygiene and skin antiseptic. A timeout was performed prior to the initiation of the procedure. The right common femoral vein was interrogated with ultrasound and found to be widely patent. An image was obtained and stored for the medical record. Local anesthesia was attained by infiltration with 1% lidocaine. A small dermatotomy was made. Under real-time sonographic guidance, the vessel was punctured with a 21 gauge micropuncture needle. Using standard technique, the initial micro needle was exchanged over a 0.018 micro wire for a transitional 4  [place] micro sheath. The micro sheath was then exchanged over a 0.035 wire for a 6 French vascular sheath. Using the exact same technique, a second 6 [place] vascular sheath was also advanced into the right common femoral artery adjacent to the first. Through the more medial sheath, a C2 cobra catheter was advanced over a Bentson wire into the right atrium, through the right ventricle and into the main pulmonary artery. A main pulmonary arteriogram was performed. The pulmonary arterial pressure was 52/21 (30) mmHg. A main pulmonary arteriogram was performed. The pulmonary arteries are grossly enlarged. Filling defects are present bilaterally. The C2 cobra catheter was then exchanged over a Rosen wire for a a 100 cm Vert catheter. The vert catheter was successfully navigated into the right main pulmonary artery. A right pulmonary arteriogram was performed. This demonstrates nearly occlusive thrombus and demonstrates the anatomy. The catheter was then successfully navigated into the right lower lobe pulmonary artery. A Rosen wire was placed. A 12 cm EKOS infusion catheter was then advanced over the wire. Through the more lateral sheath, the C2 cobra catheter was reintroduced over a Bentson wire and again navigated into the main pulmonary artery. The C2 cobra catheter was then exchanged for a 100 cm for 2 catheter. The vert catheter was successfully navigated into the left main pulmonary artery. A pulmonary arteriogram was performed confirming nearly occlusive thrombus. The catheter was successfully navigated into the left lower lobe pulmonary artery. A Rosen wire was placed. The catheter was removed. An 18 cm EKOS infusion catheter was advanced over the wire and positioned. This sheaths were secured to the skin with 0 silk suture. Thrombolysis was initiated at 1 mg/hr tPA through each catheter for a total of 2 milligrams/hour. FINDINGS: Pulmonary arterial pressure 52/21 (30) mmHg IMPRESSION: 1. Pulmonary arterial  hypertension with bilateral large volume pulmonary emboli. 2. Successful initiation of bilateral pulmonary artery thrombolysis. Signed, Antonietta Jewel.  Laurence Ferrari, MD [facility] Radiology Electronically Signed   By: Jacqulynn Cadet M.D.   On: 03/19/2017 16:37   Ir Infusion Thrombol Arterial Initial (ms)  Result Date: 03/19/2017 INDICATION: 67 year old male with acute sub massive pulmonary embolism. He presents for catheter directed bilateral pulmonary artery thrombolysis. EXAM: IR INFUSION THROMBOL ARTERIAL INITIAL (MS); BILATERAL PULMONARY ARTERIOGRAPHY; IR ULTRASOUND GUIDANCE VASC ACCESS RIGHT; ADDITIONAL ARTERIOGRAPHY COMPARISON:  CTA chest 03/18/2017 MEDICATIONS: None. ANESTHESIA/SEDATION: Versed 2 mg IV; Fentanyl 100 mcg IV Moderate Sedation Time:  30 minutes The patient was continuously monitored during the procedure by the interventional radiology nurse under my direct supervision. FLUOROSCOPY TIME:  Fluoroscopy Time: 10 minutes 36 seconds (77 mGy). COMPLICATIONS: None immediate. TECHNIQUE: Informed written consent was obtained from the patient after a thorough discussion of the procedural risks, benefits and alternatives. All questions were addressed. Maximal Sterile Barrier Technique was utilized including caps, mask, sterile gowns, sterile gloves, sterile drape, hand hygiene and skin antiseptic. A timeout was performed prior to the initiation of the procedure. The right common femoral vein was interrogated with ultrasound and found to be widely patent. An image was obtained and stored for the medical record. Local anesthesia was attained by infiltration with 1% lidocaine. A small dermatotomy was made. Under real-time sonographic guidance, the vessel was punctured with a 21 gauge micropuncture needle. Using standard technique, the initial micro needle was exchanged over a 0.018 micro wire for a transitional 4 [place] micro sheath. The micro sheath was then  exchanged over a 0.035 wire for a 6 French vascular sheath. Using the exact same technique, a second 6 [place] vascular sheath was also advanced into the right common femoral artery adjacent to the first. Through the more medial sheath, a C2 cobra catheter was advanced over a Bentson wire into the right atrium, through the right ventricle and into the main pulmonary artery. A main pulmonary arteriogram was performed. The pulmonary arterial pressure was 52/21 (30) mmHg. A main pulmonary arteriogram was performed. The pulmonary arteries are grossly enlarged. Filling defects are present bilaterally. The C2 cobra catheter was then exchanged over a Rosen wire for a a 100 cm Vert catheter. The vert catheter was successfully navigated into the right main pulmonary artery. A right pulmonary arteriogram was performed. This demonstrates nearly occlusive thrombus and demonstrates the anatomy. The catheter was then successfully navigated into the right lower lobe pulmonary artery. A Rosen wire was placed. A 12 cm EKOS infusion catheter was then advanced over the wire. Through the more lateral sheath, the C2 cobra catheter was reintroduced over a Bentson wire and again navigated into the main pulmonary artery. The C2 cobra catheter was then exchanged for a 100 cm for 2 catheter. The vert catheter was successfully navigated into the left main pulmonary artery. A pulmonary arteriogram was performed confirming nearly occlusive thrombus. The catheter was successfully navigated into the left lower lobe pulmonary artery. A Rosen wire was placed. The catheter was removed. An 18 cm EKOS infusion catheter was advanced over the wire and positioned. This sheaths were secured to the skin with 0 silk suture. Thrombolysis was initiated at 1 mg/hr tPA through each catheter for a total of 2 milligrams/hour. FINDINGS: Pulmonary arterial pressure 52/21 (30) mmHg IMPRESSION: 1. Pulmonary arterial hypertension with bilateral large volume pulmonary  emboli. 2. Successful initiation of bilateral pulmonary artery thrombolysis. Signed, Criselda Peaches, MD [facility] Radiology Electronically Signed   By: Jacqulynn Cadet M.D.   On: 03/19/2017  16:37   Ir Infusion Thrombol Arterial Initial (ms)  Result Date: 03/19/2017 INDICATION: 67 year old male with acute sub massive pulmonary embolism. He presents for catheter directed bilateral pulmonary artery thrombolysis. EXAM: IR INFUSION THROMBOL ARTERIAL INITIAL (MS); BILATERAL PULMONARY ARTERIOGRAPHY; IR ULTRASOUND GUIDANCE VASC ACCESS RIGHT; ADDITIONAL ARTERIOGRAPHY COMPARISON:  CTA chest 03/18/2017 MEDICATIONS: None. ANESTHESIA/SEDATION: Versed 2 mg IV; Fentanyl 100 mcg IV Moderate Sedation Time:  30 minutes The patient was continuously monitored during the procedure by the interventional radiology nurse under my direct supervision. FLUOROSCOPY TIME:  Fluoroscopy Time: 10 minutes 36 seconds (77 mGy). COMPLICATIONS: None immediate. TECHNIQUE: Informed written consent was obtained from the patient after a thorough discussion of the procedural risks, benefits and alternatives. All questions were addressed. Maximal Sterile Barrier Technique was utilized including caps, mask, sterile gowns, sterile gloves, sterile drape, hand hygiene and skin antiseptic. A timeout was performed prior to the initiation of the procedure. The right common femoral vein was interrogated with ultrasound and found to be widely patent. An image was obtained and stored for the medical record. Local anesthesia was attained by infiltration with 1% lidocaine. A small dermatotomy was made. Under real-time sonographic guidance, the vessel was punctured with a 21 gauge micropuncture needle. Using standard technique, the initial micro needle was exchanged over a 0.018 micro wire for a transitional 4 [place] micro sheath. The micro sheath was then exchanged over a 0.035 wire for a 6 French vascular  sheath. Using the exact same technique, a second 6 [place] vascular sheath was also advanced into the right common femoral artery adjacent to the first. Through the more medial sheath, a C2 cobra catheter was advanced over a Bentson wire into the right atrium, through the right ventricle and into the main pulmonary artery. A main pulmonary arteriogram was performed. The pulmonary arterial pressure was 52/21 (30) mmHg. A main pulmonary arteriogram was performed. The pulmonary arteries are grossly enlarged. Filling defects are present bilaterally. The C2 cobra catheter was then exchanged over a Rosen wire for a a 100 cm Vert catheter. The vert catheter was successfully navigated into the right main pulmonary artery. A right pulmonary arteriogram was performed. This demonstrates nearly occlusive thrombus and demonstrates the anatomy. The catheter was then successfully navigated into the right lower lobe pulmonary artery. A Rosen wire was placed. A 12 cm EKOS infusion catheter was then advanced over the wire. Through the more lateral sheath, the C2 cobra catheter was reintroduced over a Bentson wire and again navigated into the main pulmonary artery. The C2 cobra catheter was then exchanged for a 100 cm for 2 catheter. The vert catheter was successfully navigated into the left main pulmonary artery. A pulmonary arteriogram was performed confirming nearly occlusive thrombus. The catheter was successfully navigated into the left lower lobe pulmonary artery. A Rosen wire was placed. The catheter was removed. An 18 cm EKOS infusion catheter was advanced over the wire and positioned. This sheaths were secured to the skin with 0 silk suture. Thrombolysis was initiated at 1 mg/hr tPA through each catheter for a total of 2 milligrams/hour. FINDINGS: Pulmonary arterial pressure 52/21 (30) mmHg IMPRESSION: 1. Pulmonary arterial hypertension with bilateral large volume pulmonary emboli. 2. Successful initiation of bilateral  pulmonary artery thrombolysis. Signed, Criselda Peaches, MD [facility] Radiology Electronically Signed   By: Jacqulynn Cadet M.D.   On: 03/19/2017 16:37    Labs:  CBC: Recent Labs    03/19/17 0617 03/19/17 1841 03/20/17 0034 03/20/17 0718  WBC  8.8 10.6* 9.0 8.2  HGB 14.1 13.9 13.0 12.8*  HCT 43.7 43.1 40.1 39.8  PLT 184 187 153 137*    COAGS: No results for input(s): INR, APTT in the last 8760 hours.  BMP: Recent Labs    03/18/17 1331 03/20/17 0034  NA 136 132*  K 4.6 4.0  CL 102 104  CO2 23 22  GLUCOSE 101* 93  BUN 18 16  CALCIUM 9.6 8.2*  CREATININE 1.23 1.03  GFRNONAA >60 >60  GFRAA >60 >60    LIVER FUNCTION TESTS: Recent Labs    03/18/17 1331  BILITOT 2.4*  AST 21  ALT 24  ALKPHOS 63  PROT 7.6  ALBUMIN 3.7    Assessment and Plan: 1. PE, s/p EKOS  Patient, despite complaints of feeling like his "lungs are filling up", etc is stable.  His sats on O2 appear to be improved.  He is having a cough with some sputum production, but he states he had this cough before being admitted along with a rhinorrhea.   His pressures are improved from low 30s yesterday to 22 today with sheath removal.   Further care per primary service.  Electronically Signed: Henreitta Cea 03/20/2017, 9:23 AM   I spent a total of 15 Minutes at the the patient's bedside AND on the patient's hospital floor or unit, greater than 50% of which was counseling/coordinating care for PE

## 2017-03-20 NOTE — Progress Notes (Signed)
Patient arrived via w/c from [facility].  Alert and oriented, denies pain.  Wife at bedside.  Heparin infusing at 1500 units/hr.  Plan of care reviewed with patient and spouse.

## 2017-03-20 NOTE — Progress Notes (Signed)
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  RV/LV ratio 1.5, started on IV heparin, pt opted for EKOS procedure performed successfully 03/19/17.    PAST MEDICAL HISTORY :  He  has a past medical history of Hypertension.  PAST SURGICAL HISTORY: He  has a past surgical history that includes Joint replacement; Rotator cuff repair; Hernia repair; IR Angiogram Pulmonary Bilateral Selective (03/19/2017); IR INFUSION THROMBOL ARTERIAL INITIAL (MS) (03/19/2017); IR US Guide Vasc Access Right (03/19/2017); IR INFUSION THROMBOL ARTERIAL INITIAL (MS) (03/19/2017); IR Angiogram Selective Each Additional Vessel (03/19/2017); and IR Angiogram Selective Each Additional Vessel (03/19/2017).  No Known Allergies  No current facility-administered medications on file prior to encounter.    Current Outpatient Medications on File Prior to Encounter  Medication Sig  . amoxicillin (AMOXIL) 500 MG capsule Take 2,000 mg by mouth See admin instructions. Take 4 capsules (2000 mg) by mouth one hour prior to dental appointment (last visit 01/30/17)  . aspirin EC 81 MG tablet Take 81 mg by mouth at bedtime.  Marland Kitchen atorvastatin (LIPITOR) 10 MG tablet Take 10 mg by mouth daily.  . fluocinonide cream (LIDEX) 5.18 % Apply 1 application topically daily as needed (itching/rash (Grover's disease)).  Marland Kitchen lisinopril-hydrochlorothiazide (PRINZIDE,ZESTORETIC) 20-12.5 MG tablet Take 1 tablet by mouth daily.    FAMILY HISTORY:  His has no family status information on file.    SOCIAL HISTORY: He  reports that  has never smoked. he has never used smokeless tobacco. He reports that he drinks alcohol. He reports that he does not use drugs.  REVIEW OF SYSTEMS:   Pt complains of cough  w/some occasional blood and clots.  Chronic Back pain bothering him from being in bed, no chest pain, not feeling SOB this am  SUBJECTIVE:  EKOS procedure successful last night initial mean PA pressure 56mmHg, still on heparin, fibrinogen stable  VITAL SIGNS: BP 117/77   Pulse (!) 109   Temp (!) 100.5 F (38.1 C) (Oral)   Resp (!) 21   Ht 5\' 10"  (1.778 m)   Wt 212 lb 4.9 oz (96.3 kg)   SpO2 97%   BMI 30.46 kg/m   HEMODYNAMICS:    VENTILATOR SETTINGS:    INTAKE / OUTPUT: I/O last 3 completed shifts: In: 841 [P.O.:300; I.V.:657] Out: -   PHYSICAL EXAMINATION: Physical Exam  Constitutional: He is oriented to person, place, and time. He appears well-developed and well-nourished.  Eyes: Right eye exhibits no discharge. Left eye exhibits no discharge. No scleral icterus.  Cardiovascular: Normal rate, regular rhythm, normal heart sounds and intact distal pulses. Exam reveals no gallop and no friction rub.  No murmur heard. Pulmonary/Chest: Effort normal and breath sounds normal. No respiratory distress. He has no wheezes. He has no rales.  Abdominal: Soft. Bowel sounds are normal. He exhibits no distension and no mass. There is no tenderness. There is no guarding.  Neurological: He is alert and oriented to person, place, and time.    LABS:  BMET Recent Labs  Lab 03/18/17 1331 03/20/17 0034  NA 136 132*  K 4.6 4.0  CL 102 104  CO2 23 22  BUN 18 16  CREATININE 1.23 1.03  GLUCOSE 101* 93    Electrolytes  Recent Labs  Lab 03/18/17 1331 03/20/17 0034  CALCIUM 9.6 8.2*    CBC Recent Labs  Lab 03/19/17 0617 03/19/17 1841 03/20/17 0034  WBC 8.8 10.6* 9.0  HGB 14.1 13.9 13.0  HCT 43.7 43.1 40.1  PLT 184 187 153    Coag's No results for input(s): APTT, INR in the last 168 hours.  Sepsis Markers Recent Labs  Lab 03/18/17 1902 03/18/17 2220  LATICACIDVEN 1.0 1.0    ABG No results for input(s): PHART, PCO2ART, PO2ART in the last 168 hours.  Liver  Enzymes Recent Labs  Lab 03/18/17 1331  AST 21  ALT 24  ALKPHOS 63  BILITOT 2.4*  ALBUMIN 3.7    Cardiac Enzymes Recent Labs  Lab 03/18/17 1902 03/19/17 0119 03/19/17 0617  TROPONINI 0.07* 0.04* 0.04*    Glucose No results for input(s): GLUCAP in the last 168 hours.  Imaging Dg Chest 1 View  Result Date: 03/19/2017 CLINICAL DATA:  Pulmonary embolism post bilateral pulmonary angiogram. Abnormal respirations. History of hypertension. EXAM: CHEST 1 VIEW COMPARISON:  One 4 tooth FINDINGS: Infusion catheter is demonstrated in the pulmonary artery's bilaterally. Shallow inspiration. Cardiac enlargement. Pulmonary vascularity is normal. No airspace disease or consolidation in the lungs. No blunting of costophrenic angles. No pneumothorax. IMPRESSION: Cardiac enlargement. Shallow inspiration. No evidence of active pulmonary disease. Electronically Signed   By: Lucienne Capers M.D.   On: 03/19/2017 19:08   Ir Angiogram Pulmonary Bilateral Selective  Result Date: 03/19/2017 INDICATION: 67 year old male with acute sub massive pulmonary embolism. He presents for catheter directed bilateral pulmonary artery thrombolysis. EXAM: IR INFUSION THROMBOL ARTERIAL INITIAL (MS); BILATERAL PULMONARY ARTERIOGRAPHY; IR ULTRASOUND GUIDANCE VASC ACCESS RIGHT; ADDITIONAL ARTERIOGRAPHY COMPARISON:  CTA chest 03/18/2017 MEDICATIONS: None. ANESTHESIA/SEDATION: Versed 2 mg IV; Fentanyl 100 mcg IV Moderate Sedation Time:  30 minutes The patient was continuously monitored during the procedure by the interventional radiology nurse under my direct supervision. FLUOROSCOPY TIME:  Fluoroscopy Time: 10 minutes 36 seconds (77 mGy). COMPLICATIONS: None immediate. TECHNIQUE: Informed written consent was obtained from the patient after a thorough discussion of the procedural risks, benefits and alternatives. All questions were addressed. Maximal Sterile Barrier Technique was utilized including caps, mask, sterile gowns, sterile  gloves, sterile drape, hand hygiene and skin antiseptic. A timeout was performed prior to the initiation of the procedure. The right common femoral vein was interrogated with ultrasound and found to be widely patent. An image was obtained and stored for the medical record. Local anesthesia was attained by infiltration with 1% lidocaine. A small dermatotomy was made. Under real-time sonographic guidance, the vessel was punctured with a 21 gauge micropuncture needle. Using standard technique, the initial micro needle was exchanged over a 0.018 micro wire for a transitional 4 [place] micro sheath. The micro sheath was then exchanged over a 0.035 wire for a 6 French vascular sheath. Using the exact same technique, a second 6 [place] vascular sheath was also advanced into the right common femoral artery adjacent to the first. Through the more medial sheath, a C2 cobra catheter was advanced over a Bentson wire into the right atrium, through the right ventricle and into the main pulmonary artery. A main pulmonary arteriogram was performed. The pulmonary arterial pressure was 52/21 (30) mmHg. A main pulmonary arteriogram was performed. The pulmonary arteries are grossly enlarged. Filling defects are present bilaterally. The C2 cobra catheter was then exchanged over a Rosen wire for a a 100 cm Vert catheter. The vert catheter was successfully navigated into the right main  pulmonary artery. A right pulmonary arteriogram was performed. This demonstrates nearly occlusive thrombus and demonstrates the anatomy. The catheter was then successfully navigated into the right lower lobe pulmonary artery. A Rosen wire was placed. A 12 cm EKOS infusion catheter was then advanced over the wire. Through the more lateral sheath, the C2 cobra catheter was reintroduced over a Bentson wire and again navigated into the main pulmonary artery. The C2 cobra catheter was then exchanged for a 100 cm for 2 catheter. The vert catheter was successfully  navigated into the left main pulmonary artery. A pulmonary arteriogram was performed confirming nearly occlusive thrombus. The catheter was successfully navigated into the left lower lobe pulmonary artery. A Rosen wire was placed. The catheter was removed. An 18 cm EKOS infusion catheter was advanced over the wire and positioned. This sheaths were secured to the skin with 0 silk suture. Thrombolysis was initiated at 1 mg/hr tPA through each catheter for a total of 2 milligrams/hour. FINDINGS: Pulmonary arterial pressure 52/21 (30) mmHg IMPRESSION: 1. Pulmonary arterial hypertension with bilateral large volume pulmonary emboli. 2. Successful initiation of bilateral pulmonary artery thrombolysis. Signed, Criselda Peaches, MD [facility] Radiology Electronically Signed   By: Jacqulynn Cadet M.D.   On: 03/19/2017 16:37   Ir Angiogram Selective Each Additional Vessel  Result Date: 03/19/2017 INDICATION: 67 year old male with acute sub massive pulmonary embolism. He presents for catheter directed bilateral pulmonary artery thrombolysis. EXAM: ADDITIONAL ARTERIOGRAPHY COMPARISON:  CTA chest 03/18/2017 MEDICATIONS: None. ANESTHESIA/SEDATION: Versed 2 mg IV; Fentanyl 100 mcg IV Moderate Sedation Time: 30 minutes The patient was continuously monitored during the procedure by the interventional radiology nurse under my direct supervision. FLUOROSCOPY TIME:  Fluoroscopy Time: 10 minutes 36 seconds (77 mGy). COMPLICATIONS: None immediate. TECHNIQUE: Informed written consent was obtained from the patient after a thorough discussion of the procedural risks, benefits and alternatives. All questions were addressed. Maximal Sterile Barrier Technique was utilized including caps, mask, sterile gowns, sterile gloves, sterile drape, hand hygiene and skin antiseptic. A timeout was performed prior to the initiation of the procedure. The right common femoral vein was interrogated with  ultrasound and found to be widely patent. An image was obtained and stored for the medical record. Local anesthesia was attained by infiltration with 1% lidocaine. A small dermatotomy was made. Under real-time sonographic guidance, the vessel was punctured with a 21 gauge micropuncture needle. Using standard technique, the initial micro needle was exchanged over a 0.018 micro wire for a transitional 4 [place] micro sheath. The micro sheath was then exchanged over a 0.035 wire for a 6 French vascular sheath. Using the exact same technique, a second 6 [place] vascular sheath was also advanced into the right common femoral artery adjacent to the first. Through the more medial sheath, a C2 cobra catheter was advanced over a Bentson wire into the right atrium, through the right ventricle and into the main pulmonary artery. A main pulmonary arteriogram was performed. The pulmonary arterial pressure was 52/21 (30) mmHg. A main pulmonary arteriogram was performed. The pulmonary arteries are grossly enlarged. Filling defects are present bilaterally. The C2 cobra catheter was then exchanged over a Rosen wire for a a 100 cm Vert catheter. The vert catheter was successfully navigated into the right main pulmonary artery. A right pulmonary arteriogram was performed. This demonstrates nearly occlusive thrombus and demonstrates the anatomy. The catheter was then successfully navigated into the right lower lobe pulmonary artery. A Rosen wire was placed. A 12 cm  EKOS infusion catheter was then advanced over the wire. Through the more lateral sheath, the C2 cobra catheter was reintroduced over a Bentson wire and again navigated into the main pulmonary artery. The C2 cobra catheter was then exchanged for a 100 cm for 2 catheter. The vert catheter was successfully navigated into the left main pulmonary artery. A pulmonary arteriogram was performed confirming nearly occlusive thrombus. The catheter was successfully navigated into the left  lower lobe pulmonary artery. A Rosen wire was placed. The catheter was removed. An 18 cm EKOS infusion catheter was advanced over the wire and positioned. This sheaths were secured to the skin with 0 silk suture. Thrombolysis was initiated at 1 mg/hr tPA through each catheter for a total of 2 milligrams/hour. FINDINGS: Pulmonary arterial pressure 52/21 (30) mmHg IMPRESSION: 1. Pulmonary arterial hypertension with bilateral large volume pulmonary emboli. 2. Successful initiation of bilateral pulmonary artery thrombolysis. Electronically Signed   By: Jacqulynn Cadet M.D.   On: 03/19/2017 16:44   Ir Angiogram Selective Each Additional Vessel  Result Date: 03/19/2017 INDICATION: 67 year old male with acute sub massive pulmonary embolism. He presents for catheter directed bilateral pulmonary artery thrombolysis. EXAM: IR INFUSION THROMBOL ARTERIAL INITIAL (MS); BILATERAL PULMONARY ARTERIOGRAPHY; IR ULTRASOUND GUIDANCE VASC ACCESS RIGHT; ADDITIONAL ARTERIOGRAPHY COMPARISON:  CTA chest 03/18/2017 MEDICATIONS: None. ANESTHESIA/SEDATION: Versed 2 mg IV; Fentanyl 100 mcg IV Moderate Sedation Time:  30 minutes The patient was continuously monitored during the procedure by the interventional radiology nurse under my direct supervision. FLUOROSCOPY TIME:  Fluoroscopy Time: 10 minutes 36 seconds (77 mGy). COMPLICATIONS: None immediate. TECHNIQUE: Informed written consent was obtained from the patient after a thorough discussion of the procedural risks, benefits and alternatives. All questions were addressed. Maximal Sterile Barrier Technique was utilized including caps, mask, sterile gowns, sterile gloves, sterile drape, hand hygiene and skin antiseptic. A timeout was performed prior to the initiation of the procedure. The right common femoral vein was interrogated with ultrasound and found to be widely patent. An image was obtained and stored for the medical record. Local anesthesia was attained by infiltration with 1%  lidocaine. A small dermatotomy was made. Under real-time sonographic guidance, the vessel was punctured with a 21 gauge micropuncture needle. Using standard technique, the initial micro needle was exchanged over a 0.018 micro wire for a transitional 4 [place] micro sheath. The micro sheath was then exchanged over a 0.035 wire for a 6 French vascular sheath. Using the exact same technique, a second 6 [place] vascular sheath was also advanced into the right common femoral artery adjacent to the first. Through the more medial sheath, a C2 cobra catheter was advanced over a Bentson wire into the right atrium, through the right ventricle and into the main pulmonary artery. A main pulmonary arteriogram was performed. The pulmonary arterial pressure was 52/21 (30) mmHg. A main pulmonary arteriogram was performed. The pulmonary arteries are grossly enlarged. Filling defects are present bilaterally. The C2 cobra catheter was then exchanged over a Rosen wire for a a 100 cm Vert catheter. The vert catheter was successfully navigated into the right main pulmonary artery. A right pulmonary arteriogram was performed. This demonstrates nearly occlusive thrombus and demonstrates the anatomy. The catheter was then successfully navigated into the right lower lobe pulmonary artery. A Rosen wire was placed. A 12 cm EKOS infusion catheter was then advanced over the wire. Through the more lateral sheath, the C2 cobra catheter was reintroduced over a Bentson wire and again navigated into the main pulmonary artery. The C2  cobra catheter was then exchanged for a 100 cm for 2 catheter. The vert catheter was successfully navigated into the left main pulmonary artery. A pulmonary arteriogram was performed confirming nearly occlusive thrombus. The catheter was successfully navigated into the left lower lobe pulmonary artery. A Rosen wire was placed. The catheter was removed. An 18 cm EKOS infusion catheter was advanced over the wire and  positioned. This sheaths were secured to the skin with 0 silk suture. Thrombolysis was initiated at 1 mg/hr tPA through each catheter for a total of 2 milligrams/hour. FINDINGS: Pulmonary arterial pressure 52/21 (30) mmHg IMPRESSION: 1. Pulmonary arterial hypertension with bilateral large volume pulmonary emboli. 2. Successful initiation of bilateral pulmonary artery thrombolysis. Signed, Criselda Peaches, MD [facility] Radiology Electronically Signed   By: Jacqulynn Cadet M.D.   On: 03/19/2017 16:37   Ir US Guide Vasc Access Right  Result Date: 03/19/2017 INDICATION: 67 year old male with acute sub massive pulmonary embolism. He presents for catheter directed bilateral pulmonary artery thrombolysis. EXAM: IR INFUSION THROMBOL ARTERIAL INITIAL (MS); BILATERAL PULMONARY ARTERIOGRAPHY; IR ULTRASOUND GUIDANCE VASC ACCESS RIGHT; ADDITIONAL ARTERIOGRAPHY COMPARISON:  CTA chest 03/18/2017 MEDICATIONS: None. ANESTHESIA/SEDATION: Versed 2 mg IV; Fentanyl 100 mcg IV Moderate Sedation Time:  30 minutes The patient was continuously monitored during the procedure by the interventional radiology nurse under my direct supervision. FLUOROSCOPY TIME:  Fluoroscopy Time: 10 minutes 36 seconds (77 mGy). COMPLICATIONS: None immediate. TECHNIQUE: Informed written consent was obtained from the patient after a thorough discussion of the procedural risks, benefits and alternatives. All questions were addressed. Maximal Sterile Barrier Technique was utilized including caps, mask, sterile gowns, sterile gloves, sterile drape, hand hygiene and skin antiseptic. A timeout was performed prior to the initiation of the procedure. The right common femoral vein was interrogated with ultrasound and found to be widely patent. An image was obtained and stored for the medical record. Local anesthesia was attained by infiltration with 1% lidocaine. A small dermatotomy was made. Under real-time  sonographic guidance, the vessel was punctured with a 21 gauge micropuncture needle. Using standard technique, the initial micro needle was exchanged over a 0.018 micro wire for a transitional 4 [place] micro sheath. The micro sheath was then exchanged over a 0.035 wire for a 6 French vascular sheath. Using the exact same technique, a second 6 [place] vascular sheath was also advanced into the right common femoral artery adjacent to the first. Through the more medial sheath, a C2 cobra catheter was advanced over a Bentson wire into the right atrium, through the right ventricle and into the main pulmonary artery. A main pulmonary arteriogram was performed. The pulmonary arterial pressure was 52/21 (30) mmHg. A main pulmonary arteriogram was performed. The pulmonary arteries are grossly enlarged. Filling defects are present bilaterally. The C2 cobra catheter was then exchanged over a Rosen wire for a a 100 cm Vert catheter. The vert catheter was successfully navigated into the right main pulmonary artery. A right pulmonary arteriogram was performed. This demonstrates nearly occlusive thrombus and demonstrates the anatomy. The catheter was then successfully navigated into the right lower lobe pulmonary artery. A Rosen wire was placed. A 12 cm EKOS infusion catheter was then advanced over the wire. Through the more lateral sheath, the C2 cobra catheter was reintroduced over a Bentson wire and again navigated into the main pulmonary artery. The C2 cobra catheter was then exchanged for a 100 cm for 2 catheter. The vert catheter was successfully navigated into the left main  pulmonary artery. A pulmonary arteriogram was performed confirming nearly occlusive thrombus. The catheter was successfully navigated into the left lower lobe pulmonary artery. A Rosen wire was placed. The catheter was removed. An 18 cm EKOS infusion catheter was advanced over the wire and positioned. This sheaths were secured to the skin with 0 silk  suture. Thrombolysis was initiated at 1 mg/hr tPA through each catheter for a total of 2 milligrams/hour. FINDINGS: Pulmonary arterial pressure 52/21 (30) mmHg IMPRESSION: 1. Pulmonary arterial hypertension with bilateral large volume pulmonary emboli. 2. Successful initiation of bilateral pulmonary artery thrombolysis. Signed, Criselda Peaches, MD [facility] Radiology Electronically Signed   By: Jacqulynn Cadet M.D.   On: 03/19/2017 16:37   Ir Infusion Thrombol Arterial Initial (ms)  Result Date: 03/19/2017 INDICATION: 67 year old male with acute sub massive pulmonary embolism. He presents for catheter directed bilateral pulmonary artery thrombolysis. EXAM: IR INFUSION THROMBOL ARTERIAL INITIAL (MS); BILATERAL PULMONARY ARTERIOGRAPHY; IR ULTRASOUND GUIDANCE VASC ACCESS RIGHT; ADDITIONAL ARTERIOGRAPHY COMPARISON:  CTA chest 03/18/2017 MEDICATIONS: None. ANESTHESIA/SEDATION: Versed 2 mg IV; Fentanyl 100 mcg IV Moderate Sedation Time:  30 minutes The patient was continuously monitored during the procedure by the interventional radiology nurse under my direct supervision. FLUOROSCOPY TIME:  Fluoroscopy Time: 10 minutes 36 seconds (77 mGy). COMPLICATIONS: None immediate. TECHNIQUE: Informed written consent was obtained from the patient after a thorough discussion of the procedural risks, benefits and alternatives. All questions were addressed. Maximal Sterile Barrier Technique was utilized including caps, mask, sterile gowns, sterile gloves, sterile drape, hand hygiene and skin antiseptic. A timeout was performed prior to the initiation of the procedure. The right common femoral vein was interrogated with ultrasound and found to be widely patent. An image was obtained and stored for the medical record. Local anesthesia was attained by infiltration with 1% lidocaine. A small dermatotomy was made. Under real-time sonographic guidance, the vessel was punctured with  a 21 gauge micropuncture needle. Using standard technique, the initial micro needle was exchanged over a 0.018 micro wire for a transitional 4 [place] micro sheath. The micro sheath was then exchanged over a 0.035 wire for a 6 French vascular sheath. Using the exact same technique, a second 6 [place] vascular sheath was also advanced into the right common femoral artery adjacent to the first. Through the more medial sheath, a C2 cobra catheter was advanced over a Bentson wire into the right atrium, through the right ventricle and into the main pulmonary artery. A main pulmonary arteriogram was performed. The pulmonary arterial pressure was 52/21 (30) mmHg. A main pulmonary arteriogram was performed. The pulmonary arteries are grossly enlarged. Filling defects are present bilaterally. The C2 cobra catheter was then exchanged over a Rosen wire for a a 100 cm Vert catheter. The vert catheter was successfully navigated into the right main pulmonary artery. A right pulmonary arteriogram was performed. This demonstrates nearly occlusive thrombus and demonstrates the anatomy. The catheter was then successfully navigated into the right lower lobe pulmonary artery. A Rosen wire was placed. A 12 cm EKOS infusion catheter was then advanced over the wire. Through the more lateral sheath, the C2 cobra catheter was reintroduced over a Bentson wire and again navigated into the main pulmonary artery. The C2 cobra catheter was then exchanged for a 100 cm for 2 catheter. The vert catheter was successfully navigated into the left main pulmonary artery. A pulmonary arteriogram was performed confirming nearly occlusive thrombus. The catheter was successfully navigated into the left lower lobe pulmonary  artery. A Rosen wire was placed. The catheter was removed. An 18 cm EKOS infusion catheter was advanced over the wire and positioned. This sheaths were secured to the skin with 0 silk suture. Thrombolysis was initiated at 1 mg/hr tPA  through each catheter for a total of 2 milligrams/hour. FINDINGS: Pulmonary arterial pressure 52/21 (30) mmHg IMPRESSION: 1. Pulmonary arterial hypertension with bilateral large volume pulmonary emboli. 2. Successful initiation of bilateral pulmonary artery thrombolysis. Signed, Criselda Peaches, MD [facility] Radiology Electronically Signed   By: Jacqulynn Cadet M.D.   On: 03/19/2017 16:37   Ir Infusion Thrombol Arterial Initial (ms)  Result Date: 03/19/2017 INDICATION: 67 year old male with acute sub massive pulmonary embolism. He presents for catheter directed bilateral pulmonary artery thrombolysis. EXAM: IR INFUSION THROMBOL ARTERIAL INITIAL (MS); BILATERAL PULMONARY ARTERIOGRAPHY; IR ULTRASOUND GUIDANCE VASC ACCESS RIGHT; ADDITIONAL ARTERIOGRAPHY COMPARISON:  CTA chest 03/18/2017 MEDICATIONS: None. ANESTHESIA/SEDATION: Versed 2 mg IV; Fentanyl 100 mcg IV Moderate Sedation Time:  30 minutes The patient was continuously monitored during the procedure by the interventional radiology nurse under my direct supervision. FLUOROSCOPY TIME:  Fluoroscopy Time: 10 minutes 36 seconds (77 mGy). COMPLICATIONS: None immediate. TECHNIQUE: Informed written consent was obtained from the patient after a thorough discussion of the procedural risks, benefits and alternatives. All questions were addressed. Maximal Sterile Barrier Technique was utilized including caps, mask, sterile gowns, sterile gloves, sterile drape, hand hygiene and skin antiseptic. A timeout was performed prior to the initiation of the procedure. The right common femoral vein was interrogated with ultrasound and found to be widely patent. An image was obtained and stored for the medical record. Local anesthesia was attained by infiltration with 1% lidocaine. A small dermatotomy was made. Under real-time sonographic guidance, the vessel was punctured with a 21 gauge micropuncture needle. Using standard  technique, the initial micro needle was exchanged over a 0.018 micro wire for a transitional 4 [place] micro sheath. The micro sheath was then exchanged over a 0.035 wire for a 6 French vascular sheath. Using the exact same technique, a second 6 [place] vascular sheath was also advanced into the right common femoral artery adjacent to the first. Through the more medial sheath, a C2 cobra catheter was advanced over a Bentson wire into the right atrium, through the right ventricle and into the main pulmonary artery. A main pulmonary arteriogram was performed. The pulmonary arterial pressure was 52/21 (30) mmHg. A main pulmonary arteriogram was performed. The pulmonary arteries are grossly enlarged. Filling defects are present bilaterally. The C2 cobra catheter was then exchanged over a Rosen wire for a a 100 cm Vert catheter. The vert catheter was successfully navigated into the right main pulmonary artery. A right pulmonary arteriogram was performed. This demonstrates nearly occlusive thrombus and demonstrates the anatomy. The catheter was then successfully navigated into the right lower lobe pulmonary artery. A Rosen wire was placed. A 12 cm EKOS infusion catheter was then advanced over the wire. Through the more lateral sheath, the C2 cobra catheter was reintroduced over a Bentson wire and again navigated into the main pulmonary artery. The C2 cobra catheter was then exchanged for a 100 cm for 2 catheter. The vert catheter was successfully navigated into the left main pulmonary artery. A pulmonary arteriogram was performed confirming nearly occlusive thrombus. The catheter was successfully navigated into the left lower lobe pulmonary artery. A Rosen wire was placed. The catheter was removed. An 18 cm EKOS infusion catheter was advanced over the wire and  positioned. This sheaths were secured to the skin with 0 silk suture. Thrombolysis was initiated at 1 mg/hr tPA through each catheter for a total of 2  milligrams/hour. FINDINGS: Pulmonary arterial pressure 52/21 (30) mmHg IMPRESSION: 1. Pulmonary arterial hypertension with bilateral large volume pulmonary emboli. 2. Successful initiation of bilateral pulmonary artery thrombolysis. Signed, Criselda Peaches, MD [facility] Radiology Electronically Signed   By: Jacqulynn Cadet M.D.   On: 03/19/2017 16:37       DISCUSSION: Factor V leiden pt comes in with DVT and massive saddle PE occlusive on left.  RV/LV ratio 1.5, started on IV heparin, pt opted for EKOS procedure performed successfully 03/19/17.   ASSESSMENT / PLAN:  PULMONARY A: Large Saddle PE s/p catheter guided lysis EKOS procedure stable, no respiratory distress, 98% on 3LNC  P:   continue heparin ECHO Normal LV function, G1DD, showed mild-moderate decrease in RV systolic function, only mild TR,  increased PA pressure Wean supplemental O2 IR to remove sheaths this am Will need lifelong AC on discharge   CARDIOVASCULAR A:  Severe PE causing R heart strain P:  ECHO results above Hemodynamically stable  RENAL A:   No acute issues  P:   Monitor BMP for contrast injury   GASTROINTESTINAL A:   No acute issues P:   If IR okay will advance pts diet Monitor for GI bleed  HEMATOLOGIC A:   Hgb stable, fibringogen stable P:  CBCq6, fibrinogenq6  INFECTIOUS A:   No acute issues P:   Regular monitoring for ICU associated infections  ENDOCRINE A:   No acute issues   P:   NTD  NEUROLOGIC A:   Alert, oriented, following commands P:   NTD   FAMILY  - Updates:   - Inter-disciplinary family meet or Palliative Care meeting due by:  day 7    Pulmonary and [place] Pager: [phone]  03/20/2017, 6:34 AM

## 2017-03-21 DIAGNOSIS — I10 Essential (primary) hypertension: Secondary | ICD-10-CM

## 2017-03-21 DIAGNOSIS — R0902 Hypoxemia: Secondary | ICD-10-CM

## 2017-03-21 DIAGNOSIS — J9601 Acute respiratory failure with hypoxia: Secondary | ICD-10-CM

## 2017-03-21 DIAGNOSIS — D649 Anemia, unspecified: Secondary | ICD-10-CM

## 2017-03-21 DIAGNOSIS — R739 Hyperglycemia, unspecified: Secondary | ICD-10-CM

## 2017-03-21 DIAGNOSIS — D6851 Activated protein C resistance: Secondary | ICD-10-CM

## 2017-03-21 DIAGNOSIS — R7989 Other specified abnormal findings of blood chemistry: Secondary | ICD-10-CM

## 2017-03-21 DIAGNOSIS — M62830 Muscle spasm of back: Secondary | ICD-10-CM

## 2017-03-21 DIAGNOSIS — M25512 Pain in left shoulder: Secondary | ICD-10-CM

## 2017-03-21 DIAGNOSIS — I2609 Other pulmonary embolism with acute cor pulmonale: Secondary | ICD-10-CM

## 2017-03-21 DIAGNOSIS — J989 Respiratory disorder, unspecified: Secondary | ICD-10-CM

## 2017-03-21 DIAGNOSIS — R000 Tachycardia, unspecified: Secondary | ICD-10-CM

## 2017-03-21 DIAGNOSIS — R748 Abnormal levels of other serum enzymes: Secondary | ICD-10-CM

## 2017-03-21 LAB — COMPREHENSIVE METABOLIC PANEL
ALBUMIN: 2.8 g/dL — AB (ref 3.5–5.0)
ALT: 25 U/L (ref 17–63)
ANION GAP: 7 (ref 5–15)
AST: 27 U/L (ref 15–41)
Alkaline Phosphatase: 45 U/L (ref 38–126)
BUN: 11 mg/dL (ref 6–20)
CALCIUM: 8.2 mg/dL — AB (ref 8.9–10.3)
CHLORIDE: 103 mmol/L (ref 101–111)
CO2: 26 mmol/L (ref 22–32)
Creatinine, Ser: 1 mg/dL (ref 0.61–1.24)
GFR calc non Af Amer: >60 mL/min (ref >60)
GLUCOSE: 131 mg/dL — AB (ref 65–99)
Potassium: 4 mmol/L (ref 3.5–5.1)
SODIUM: 136 mmol/L (ref 135–145)
Total Bilirubin: 1.7 mg/dL — ABNORMAL HIGH (ref 0.3–1.2)
Total Protein: 5.8 g/dL — ABNORMAL LOW (ref 6.5–8.1)

## 2017-03-21 LAB — CBC
HCT: 39 % (ref 39.0–52.0)
HEMOGLOBIN: 12.3 g/dL — AB (ref 13.0–17.0)
MCH: 29.2 pg (ref 26.0–34.0)
MCHC: 31.5 g/dL (ref 30.0–36.0)
MCV: 92.6 fL (ref 78.0–100.0)
Platelets: 153 10*3/uL (ref 150–400)
RBC: 4.21 MIL/uL — AB (ref 4.22–5.81)
RDW: 12.7 % (ref 11.5–15.5)
WBC: 7.2 10*3/uL (ref 4.0–10.5)

## 2017-03-21 LAB — HEPARIN LEVEL (UNFRACTIONATED)
HEPARIN UNFRACTIONATED: 0.44 [IU]/mL (ref 0.30–0.70)
Heparin Unfractionated: 0.35 IU/mL (ref 0.30–0.70)

## 2017-03-21 LAB — MAGNESIUM: MAGNESIUM: 2.2 mg/dL (ref 1.7–2.4)

## 2017-03-21 LAB — PHOSPHORUS: Phosphorus: 2.7 mg/dL (ref 2.5–4.6)

## 2017-03-21 MED ORDER — LIDOCAINE 5 % EX PTCH
1.0000 | MEDICATED_PATCH | CUTANEOUS | Status: DC
  Administered 2017-03-21: 1 via TRANSDERMAL
  Filled 2017-03-21 (×2): qty 1

## 2017-03-21 NOTE — Progress Notes (Signed)
ANTICOAGULATION CONSULT NOTE - Follow-Up Consult  Pharmacy Consult for heparin Indication: pulmonary embolus  No Known Allergies  Patient Measurements: Height: 5\' 10"  (177.8 cm) Weight: 214 lb 6.4 oz (97.3 kg) IBW/kg (Calculated) : 73 Heparin Dosing Weight: 92kg  Vital Signs: Temp: 98.6 F (37 C) (01/07 1135) Temp Source: Oral (01/07 1135) BP: 115/68 (01/07 1135) Pulse Rate: 88 (01/07 1135)  Labs: Recent Labs    03/18/17 1331  03/19/17 0119 03/19/17 0617  03/20/17 0034 03/20/17 0718 03/20/17 1541 03/20/17 1944 03/21/17 0144 03/21/17 0816  HGB 15.5  --   --  14.1   < > 13.0 12.8* 12.6*  --  12.3*  --   HCT 47.1  --   --  43.7   < > 40.1 39.8 39.5  --  39.0  --   PLT 198  --   --  184   < > 153 137* 135*  --  153  --   HEPARINUNFRC  --   --  0.49  --    < >  --  0.45 0.30 0.40 0.35 0.44  CREATININE 1.23  --   --   --   --  1.03  --   --   --  1.00  --   TROPONINI 0.07*   < > 0.04* 0.04*  --   --  0.04*  --   --   --   --    < > = values in this interval not displayed.    Estimated Creatinine Clearance: 87.3 mL/min (by C-G formula based on SCr of 1 mg/dL).   Medical History: Past Medical History:  Diagnosis Date  . Hypertension     Medications:  Infusions:  . sodium chloride Stopped (03/20/17 0930)  . sodium chloride Stopped (03/20/17 0930)  . sodium chloride Stopped (03/20/17 0930)  . sodium chloride 10 mL/hr at 03/20/17 0930  . sodium chloride    . heparin 1,500 Units/hr (03/21/17 4097)    Assessment: 67 yo M presented to the ED on 1/4 with SOB. Found to have large bilateral PE with right heart strain. Started IV heparin and received catheter directed thrombolytic (EKOS) 1/5 > 1/6. Pt continues on heparin at 1500 units/hr with therapeutic levels.  Follow-up plans for oral anticoagulation.  He is not on anticoagulation PTA but has been on xarelto in the past for history of DVT.   Goal of Therapy:  Heparin level 0.3-0.7 units/ml Monitor platelets by  anticoagulation protocol: Yes   Plan:  Continue Heparin at 1500 units/hr Daily heparin level and CBC Follow-up plans for oral anticoagulation  Irisa Grimsley, Pharm.D., BCPS Clinical Pharmacist Pager: [phone] Clinical phone for 03/21/2017 from 8:30-4:00 is [phone]. After 4pm, please call Main Rx [phone]) for assistance. 03/21/2017 1:02 PM

## 2017-03-21 NOTE — Progress Notes (Signed)
Nutrition Brief Note  Patient identified on the Malnutrition Screening Tool (MST) Report. Patient reports minimal weight loss PTA. Nutrition focused physical exam completed.  No muscle or subcutaneous fat depletion noticed.  Ht Readings from Last 3 Encounters:  03/19/17 5\' 10"  (1.778 m)    Wt Readings from Last 15 Encounters:  03/21/17 214 lb 6.4 oz (97.3 kg)    Body mass index is 30.76 kg/m. Patient meets criteria for obesity based on current BMI.   Current diet order is regular, patient is consuming approximately 100% of meals at this time. Labs and medications reviewed.   No nutrition interventions warranted at this time. If nutrition issues arise, please consult RD.   Molli Barrows, RD, LDN, [place] Pager [phone] After Hours Pager [phone]

## 2017-03-21 NOTE — Progress Notes (Signed)
PROGRESS NOTE    Jose Bowers  UXL:[id number] DOB: 1950-03-17 DOA: 03/18/2017 PCP: Wenda Low, MD   Brief Narrative: The patient is a 67 year old male with past medical history, which is significant for hypertension, hyperlipidemia, and factor V Leiden, and other comorbids. He does have a history of DVT after shoulder surgery about 5 years ago, which was treated with Xarelto.  Recent course is insignificant until about 3 weeks ago when he developed left knee pain and was seen in primary care and given a steroid injection. Reports symptoms resolved at that time.  About 10 days prior to arrival he began to experience dyspnea and dyspnea was progressive over the following 10 days with occasional chest tightness.  Until he was unable to walk throughout his house or put on his socks without becoming short of breath.  He presented to [facility] emergency department 1/4 with this complaint.  He had a CT angiogram of his chest performed, which demonstrated saddle PE with large clot burden.  RV/LV ratio 1.5.  He denies any recent injury and travel. EDP contacted interventional radiology for consideration of EKOS and PCCM has been asked to see for admission. He underwent EKOS and heparinization and was deemed stable to transfer out of ICU to [facility] 1/7. He is currently still on Heparin gtt with plans to transition to po Anticoagulation in AM.     Assessment & Plan:   Active Problems:   Pulmonary embolism (HCC)   Deep venous thrombosis of left profunda femoris vein (HCC)   Acute respiratory failure with hypoxia (HCC)   Elevated troponin   Essential hypertension   Factor V Leiden (HCC)   Normocytic anemia   Hyperglycemia   Hypomagnesemia   Hyperbilirubinemia   Back muscle spasm   Trigger point of left shoulder region  Acute Respiratory Failure with Hypoxia 2/2 to Saddle PE -Improved -Continue with Supplemental O2 as needed and Wean as tolerated -Continuous Pulse Oximetry and Maintain O2  Saturations >92% -Will need Ambulatory [place]le Oximetry Screening to evaluate if patient needs Home O2  Acute Large Saddle Pulmonary Embolus s/p EKOS -CTA of Chest showed Large central bilateral pulmonary embolus, which is nearly occlusive in the left main pulmonary artery extending to all 3 left-sided lobar arterial branches. Nonocclusive right main pulmonary artery embolus extending to the right upper lobar and right lower lobar pulmonary arteries. Heavy clot burden with marked right heart strain with RV/LV ratio of 1.5. -ECHO showed EF of of 50-55%, Grade 1DD, and Right Ventricle Cavity was dilated and Systolic Fxn was mildly to moderately reduced -Had catheter guided lysis with EKOS on 03/19/16 and IR removed Sheath on 03/20/17 -Fibrinogen Level went from 552 -> 451 -> 437 -> 488 -C/w Heparin gtt today with Pharmacy to manage -Will transition to po Anticoagulation in AM; Case Management Consulted for price check on Xarelto vs. Apixaban  -PESI Score was at least Class 2  Left Leg DVT -Duplex preliminary report showed that ther is acute DVT noted in the left posterior tibial, peroneal, popliteal, and mid to distal femoral veins.   -C/w Anticoagulation as above.   Elevated Troponin -In the Setting of Acute Saddle PE -Stable and Flat at 0.04  Essential Hypertension -Holding Lisinopril-HCTZ currently -Patient likely has Bradykinin Cough from ACE-I and discussed with the patient and will like to change to ARB as an outpatient -C/w Dextroemthorphan 30 mg po BIDprn Cough -Continue to Monitor BP closely   Hx of Factor V Leiden -Discussed with Hematology and will pursue  outpatient Hypercoaguable workup -Has Family Hx of it as well -Per my Conversation with Dr. Burr Medico patient will have a follow up within 1 month and will likely need life-long Anticoagulation   Normocytic Anemia -Patient's Hb/Hct has slowly been trending down and went from 14.1/43.7 -> 12.3/39.0 -Had some blood from blowing his nose  that was clotted -Continue to Monitor for S/Sx for bleeding as patient is Anticoagulated on Heparin gtt -Repeat CBC in AM   Hyperglycemia -Check HbA1c in AM -Continue to Monitor Blood Sugares on BMP's  Hypomagnesemia -Improved. Mag Level went from 1.6 -> 2.2 -Continue to Monitor and Replete as Necessary -Repeat Mag Level in AM  Hyperbilirubinemia -Trending Down and went from 2.4 -> 1.7 -Continue to Monitor and repeat CMP in AM  Back Spasm with likely Trigger Point in Muscle vs Lipoma -Outpatient Dry Needling and evaluation  -C/w Diazepam 2.5 mg IV q2hprn Muscle Spasms -C/w Muscle Rub Cream  Hyperlipidemia -Check Lipid Panel in AM -C/w Atorvastatin 10 mg po Daily   DVT prophylaxis: Anticoagulated with Heparin gtt Code Status: FULL COE Family Communication: Discussed with wife at bedside Disposition Plan: Anticipate D/C Home in AM   Consultants:   PCCM Transfer  Interventional Radiology  Discussed case with Dr. Burr Medico of Hematology/Oncology   Procedures:  ECHOCARDIOGRAM Study Conclusions  - Left ventricle: The cavity size was normal. Wall thickness was   increased in a pattern of mild LVH. Systolic function was normal.   The estimated ejection fraction was in the range of 50% to 55%.   Wall motion was normal; there were no regional wall motion   abnormalities. Doppler parameters are consistent with abnormal   left ventricular relaxation (grade 1 diastolic dysfunction). - Right ventricle: The cavity size was dilated. Systolic function   was mildly to moderately reduced. - Pulmonary arteries: Systolic pressure was mildly to moderately   increased. PA peak pressure: 42 mm Hg (S).  LE DUPLEX Showed that ther is acute DVT noted in the left posterior tibial, peroneal, popliteal, and mid to distal femoral veins.     Antimicrobials: Antibiotics Given (last 72 hours)    None     Subjective: Seen and examined and stated his cough was bothering him. Blew his nose  and coughed up a small blood clot. No Nausea or vomiting. Did not feel dyspenic. No other complaints or concerns.   Objective: Vitals:   03/21/17 0010 03/21/17 0416 03/21/17 0700 03/21/17 1135  BP: 125/75 117/71 120/69 115/68  Pulse: (!) 110 93 (!) 101 88  Resp: (!) 22 (!) 22    Temp: 99.6 F (37.6 C) 99 F (37.2 C) 98.8 F (37.1 C) 98.6 F (37 C)  TempSrc: Oral Oral Oral Oral  SpO2: 94% 92% 92% 94%  Weight:  97.3 kg (214 lb 6.4 oz)    Height:        Intake/Output Summary (Last 24 hours) at 03/21/2017 1435 Last data filed at 03/21/2017 1610 Gross per 24 hour  Intake 918.25 ml  Output 775 ml  Net 143.25 ml   Filed Weights   03/19/17 0015 03/20/17 1742 03/21/17 0416  Weight: 96.3 kg (212 lb 4.9 oz) 98.2 kg (216 lb 6.4 oz) 97.3 kg (214 lb 6.4 oz)   Examination: Physical Exam:  Constitutional: WN/WD Caucasian Male in NAD and appears calm and comfortable Eyes: Lids and conjunctivae normal, sclerae anicteric  ENMT: External Ears, Nose appear normal. Grossly normal hearing. Mucous membranes are moist.  Neck: Appears normal, supple, no cervical masses, normal  ROM, no appreciable thyromegaly; no JVD Respiratory: Slightly diminished to auscultation bilaterally, no wheezing, rales, rhonchi or crackles. Normal respiratory effort and patient is not tachypenic. No accessory muscle use.  Cardiovascular: RRR, no murmurs / rubs / gallops. S1 and S2 auscultated. No extremity edema.  Abdomen: Soft, non-tender, non-distended. No masses palpated. No appreciable hepatosplenomegaly. Bowel sounds positive x4  GU: Deferred. Musculoskeletal: No clubbing / cyanosis of digits/nails. No joint deformity upper and lower extremities. Has a trigger point on Left Scapula region Skin: No rashes, lesions, ulcers on a limited skin eval. No induration; Warm and dry.  Neurologic: CN 2-12 grossly intact with no focal deficits. Sensation intact in all 4 Extremities. Romberg sign cerebellar reflexes not assessed.    Psychiatric: Normal judgment and insight. Alert and oriented x 3. Normal mood and appropriate affect.   Data Reviewed: I have personally reviewed following labs and imaging studies  CBC: Recent Labs  Lab 03/18/17 1331  03/19/17 1841 03/20/17 0034 03/20/17 0718 03/20/17 1541 03/21/17 0144  WBC 8.1   < > 10.6* 9.0 8.2 6.3 7.2  NEUTROABS 6.0  --   --   --   --   --   --   HGB 15.5   < > 13.9 13.0 12.8* 12.6* 12.3*  HCT 47.1   < > 43.1 40.1 39.8 39.5 39.0  MCV 93.8   < > 92.7 92.6 92.8 91.9 92.6  PLT 198   < > 187 153 137* 135* 153   < > = values in this interval not displayed.   Basic Metabolic Panel: Recent Labs  Lab 03/18/17 1331 03/20/17 0034 03/20/17 0718 03/21/17 0144  NA 136 132*  --  136  K 4.6 4.0  --  4.0  CL 102 104  --  103  CO2 23 22  --  26  GLUCOSE 101* 93  --  131*  BUN 18 16  --  11  CREATININE 1.23 1.03  --  1.00  CALCIUM 9.6 8.2*  --  8.2*  MG  --   --  1.6* 2.2  PHOS  --   --  3.3 2.7   GFR: Estimated Creatinine Clearance: 87.3 mL/min (by C-G formula based on SCr of 1 mg/dL). Liver Function Tests: Recent Labs  Lab 03/18/17 1331 03/21/17 0144  AST 21 27  ALT 24 25  ALKPHOS 63 45  BILITOT 2.4* 1.7*  PROT 7.6 5.8*  ALBUMIN 3.7 2.8*   No results for input(s): LIPASE, AMYLASE in the last 168 hours. No results for input(s): AMMONIA in the last 168 hours. Coagulation Profile: No results for input(s): INR, PROTIME in the last 168 hours. Cardiac Enzymes: Recent Labs  Lab 03/18/17 1331 03/18/17 1902 03/19/17 0119 03/19/17 0617 03/20/17 0718  TROPONINI 0.07* 0.07* 0.04* 0.04* 0.04*   BNP (last 3 results) No results for input(s): PROBNP in the last 8760 hours. HbA1C: No results for input(s): HGBA1C in the last 72 hours. CBG: No results for input(s): GLUCAP in the last 168 hours. Lipid Profile: No results for input(s): CHOL, HDL, LDLCALC, TRIG, CHOLHDL, LDLDIRECT in the last 72 hours. Thyroid Function Tests: No results for input(s):  TSH, T4TOTAL, FREET4, T3FREE, THYROIDAB in the last 72 hours. Anemia Panel: No results for input(s): VITAMINB12, FOLATE, FERRITIN, TIBC, IRON, RETICCTPCT in the last 72 hours. Sepsis Labs: Recent Labs  Lab 03/18/17 1902 03/18/17 2220  LATICACIDVEN 1.0 1.0    Recent Results (from the past 240 hour(s))  MRSA PCR Screening     Status:  None   Collection Time: 03/19/17  3:39 AM  Result Value Ref Range Status   MRSA by PCR NEGATIVE NEGATIVE Final    Comment:        The GeneXpert MRSA Assay (FDA approved for NASAL specimens only), is one component of a comprehensive MRSA colonization surveillance program. It is not intended to diagnose MRSA infection nor to guide or monitor treatment for MRSA infections.     Radiology Studies: Dg Chest 1 View  Result Date: 03/19/2017 CLINICAL DATA:  Pulmonary embolism post bilateral pulmonary angiogram. Abnormal respirations. History of hypertension. EXAM: CHEST 1 VIEW COMPARISON:  One 4 tooth FINDINGS: Infusion catheter is demonstrated in the pulmonary artery's bilaterally. Shallow inspiration. Cardiac enlargement. Pulmonary vascularity is normal. No airspace disease or consolidation in the lungs. No blunting of costophrenic angles. No pneumothorax. IMPRESSION: Cardiac enlargement. Shallow inspiration. No evidence of active pulmonary disease. Electronically Signed   By: Lucienne Capers M.D.   On: 03/19/2017 19:08   Ir Angiogram Pulmonary Bilateral Selective  Result Date: 03/19/2017 INDICATION: 67 year old male with acute sub massive pulmonary embolism. He presents for catheter directed bilateral pulmonary artery thrombolysis. EXAM: IR INFUSION THROMBOL ARTERIAL INITIAL (MS); BILATERAL PULMONARY ARTERIOGRAPHY; IR ULTRASOUND GUIDANCE VASC ACCESS RIGHT; ADDITIONAL ARTERIOGRAPHY COMPARISON:  CTA chest 03/18/2017 MEDICATIONS: None. ANESTHESIA/SEDATION: Versed 2 mg IV; Fentanyl 100 mcg IV Moderate Sedation Time:  30 minutes The patient was continuously  monitored during the procedure by the interventional radiology nurse under my direct supervision. FLUOROSCOPY TIME:  Fluoroscopy Time: 10 minutes 36 seconds (77 mGy). COMPLICATIONS: None immediate. TECHNIQUE: Informed written consent was obtained from the patient after a thorough discussion of the procedural risks, benefits and alternatives. All questions were addressed. Maximal Sterile Barrier Technique was utilized including caps, mask, sterile gowns, sterile gloves, sterile drape, hand hygiene and skin antiseptic. A timeout was performed prior to the initiation of the procedure. The right common femoral vein was interrogated with ultrasound and found to be widely patent. An image was obtained and stored for the medical record. Local anesthesia was attained by infiltration with 1% lidocaine. A small dermatotomy was made. Under real-time sonographic guidance, the vessel was punctured with a 21 gauge micropuncture needle. Using standard technique, the initial micro needle was exchanged over a 0.018 micro wire for a transitional 4 [place] micro sheath. The micro sheath was then exchanged over a 0.035 wire for a 6 French vascular sheath. Using the exact same technique, a second 6 [place] vascular sheath was also advanced into the right common femoral artery adjacent to the first. Through the more medial sheath, a C2 cobra catheter was advanced over a Bentson wire into the right atrium, through the right ventricle and into the main pulmonary artery. A main pulmonary arteriogram was performed. The pulmonary arterial pressure was 52/21 (30) mmHg. A main pulmonary arteriogram was performed. The pulmonary arteries are grossly enlarged. Filling defects are present bilaterally. The C2 cobra catheter was then exchanged over a Rosen wire for a a 100 cm Vert catheter. The vert catheter was successfully navigated into the right main pulmonary artery. A right pulmonary arteriogram was performed. This demonstrates nearly occlusive  thrombus and demonstrates the anatomy. The catheter was then successfully navigated into the right lower lobe pulmonary artery. A Rosen wire was placed. A 12 cm EKOS infusion catheter was then advanced over the wire. Through the more lateral sheath, the C2 cobra catheter was reintroduced over a Bentson wire and again navigated into the main pulmonary artery. The C2 cobra catheter was  then exchanged for a 100 cm for 2 catheter. The vert catheter was successfully navigated into the left main pulmonary artery. A pulmonary arteriogram was performed confirming nearly occlusive thrombus. The catheter was successfully navigated into the left lower lobe pulmonary artery. A Rosen wire was placed. The catheter was removed. An 18 cm EKOS infusion catheter was advanced over the wire and positioned. This sheaths were secured to the skin with 0 silk suture. Thrombolysis was initiated at 1 mg/hr tPA through each catheter for a total of 2 milligrams/hour. FINDINGS: Pulmonary arterial pressure 52/21 (30) mmHg IMPRESSION: 1. Pulmonary arterial hypertension with bilateral large volume pulmonary emboli. 2. Successful initiation of bilateral pulmonary artery thrombolysis. Signed, Criselda Peaches, MD [facility] Radiology Electronically Signed   By: Jacqulynn Cadet M.D.   On: 03/19/2017 16:37   Ir Angiogram Selective Each Additional Vessel  Result Date: 03/19/2017 INDICATION: 67 year old male with acute sub massive pulmonary embolism. He presents for catheter directed bilateral pulmonary artery thrombolysis. EXAM: ADDITIONAL ARTERIOGRAPHY COMPARISON:  CTA chest 03/18/2017 MEDICATIONS: None. ANESTHESIA/SEDATION: Versed 2 mg IV; Fentanyl 100 mcg IV Moderate Sedation Time: 30 minutes The patient was continuously monitored during the procedure by the interventional radiology nurse under my direct supervision. FLUOROSCOPY TIME:  Fluoroscopy Time: 10 minutes 36 seconds (77 mGy).  COMPLICATIONS: None immediate. TECHNIQUE: Informed written consent was obtained from the patient after a thorough discussion of the procedural risks, benefits and alternatives. All questions were addressed. Maximal Sterile Barrier Technique was utilized including caps, mask, sterile gowns, sterile gloves, sterile drape, hand hygiene and skin antiseptic. A timeout was performed prior to the initiation of the procedure. The right common femoral vein was interrogated with ultrasound and found to be widely patent. An image was obtained and stored for the medical record. Local anesthesia was attained by infiltration with 1% lidocaine. A small dermatotomy was made. Under real-time sonographic guidance, the vessel was punctured with a 21 gauge micropuncture needle. Using standard technique, the initial micro needle was exchanged over a 0.018 micro wire for a transitional 4 [place] micro sheath. The micro sheath was then exchanged over a 0.035 wire for a 6 French vascular sheath. Using the exact same technique, a second 6 [place] vascular sheath was also advanced into the right common femoral artery adjacent to the first. Through the more medial sheath, a C2 cobra catheter was advanced over a Bentson wire into the right atrium, through the right ventricle and into the main pulmonary artery. A main pulmonary arteriogram was performed. The pulmonary arterial pressure was 52/21 (30) mmHg. A main pulmonary arteriogram was performed. The pulmonary arteries are grossly enlarged. Filling defects are present bilaterally. The C2 cobra catheter was then exchanged over a Rosen wire for a a 100 cm Vert catheter. The vert catheter was successfully navigated into the right main pulmonary artery. A right pulmonary arteriogram was performed. This demonstrates nearly occlusive thrombus and demonstrates the anatomy. The catheter was then successfully navigated into the right lower lobe pulmonary artery. A Rosen wire was placed. A 12 cm EKOS  infusion catheter was then advanced over the wire. Through the more lateral sheath, the C2 cobra catheter was reintroduced over a Bentson wire and again navigated into the main pulmonary artery. The C2 cobra catheter was then exchanged for a 100 cm for 2 catheter. The vert catheter was successfully navigated into the left main pulmonary artery. A pulmonary arteriogram was performed confirming nearly occlusive thrombus. The catheter was successfully navigated into the left  lower lobe pulmonary artery. A Rosen wire was placed. The catheter was removed. An 18 cm EKOS infusion catheter was advanced over the wire and positioned. This sheaths were secured to the skin with 0 silk suture. Thrombolysis was initiated at 1 mg/hr tPA through each catheter for a total of 2 milligrams/hour. FINDINGS: Pulmonary arterial pressure 52/21 (30) mmHg IMPRESSION: 1. Pulmonary arterial hypertension with bilateral large volume pulmonary emboli. 2. Successful initiation of bilateral pulmonary artery thrombolysis. Electronically Signed   By: Jacqulynn Cadet M.D.   On: 03/19/2017 16:44   Ir Angiogram Selective Each Additional Vessel  Result Date: 03/19/2017 INDICATION: 67 year old male with acute sub massive pulmonary embolism. He presents for catheter directed bilateral pulmonary artery thrombolysis. EXAM: IR INFUSION THROMBOL ARTERIAL INITIAL (MS); BILATERAL PULMONARY ARTERIOGRAPHY; IR ULTRASOUND GUIDANCE VASC ACCESS RIGHT; ADDITIONAL ARTERIOGRAPHY COMPARISON:  CTA chest 03/18/2017 MEDICATIONS: None. ANESTHESIA/SEDATION: Versed 2 mg IV; Fentanyl 100 mcg IV Moderate Sedation Time:  30 minutes The patient was continuously monitored during the procedure by the interventional radiology nurse under my direct supervision. FLUOROSCOPY TIME:  Fluoroscopy Time: 10 minutes 36 seconds (77 mGy). COMPLICATIONS: None immediate. TECHNIQUE: Informed written consent was obtained from the patient after a thorough discussion of the procedural risks,  benefits and alternatives. All questions were addressed. Maximal Sterile Barrier Technique was utilized including caps, mask, sterile gowns, sterile gloves, sterile drape, hand hygiene and skin antiseptic. A timeout was performed prior to the initiation of the procedure. The right common femoral vein was interrogated with ultrasound and found to be widely patent. An image was obtained and stored for the medical record. Local anesthesia was attained by infiltration with 1% lidocaine. A small dermatotomy was made. Under real-time sonographic guidance, the vessel was punctured with a 21 gauge micropuncture needle. Using standard technique, the initial micro needle was exchanged over a 0.018 micro wire for a transitional 4 [place] micro sheath. The micro sheath was then exchanged over a 0.035 wire for a 6 French vascular sheath. Using the exact same technique, a second 6 [place] vascular sheath was also advanced into the right common femoral artery adjacent to the first. Through the more medial sheath, a C2 cobra catheter was advanced over a Bentson wire into the right atrium, through the right ventricle and into the main pulmonary artery. A main pulmonary arteriogram was performed. The pulmonary arterial pressure was 52/21 (30) mmHg. A main pulmonary arteriogram was performed. The pulmonary arteries are grossly enlarged. Filling defects are present bilaterally. The C2 cobra catheter was then exchanged over a Rosen wire for a a 100 cm Vert catheter. The vert catheter was successfully navigated into the right main pulmonary artery. A right pulmonary arteriogram was performed. This demonstrates nearly occlusive thrombus and demonstrates the anatomy. The catheter was then successfully navigated into the right lower lobe pulmonary artery. A Rosen wire was placed. A 12 cm EKOS infusion catheter was then advanced over the wire. Through the more lateral sheath, the C2 cobra catheter was reintroduced over a Bentson wire and again  navigated into the main pulmonary artery. The C2 cobra catheter was then exchanged for a 100 cm for 2 catheter. The vert catheter was successfully navigated into the left main pulmonary artery. A pulmonary arteriogram was performed confirming nearly occlusive thrombus. The catheter was successfully navigated into the left lower lobe pulmonary artery. A Rosen wire was placed. The catheter was removed. An 18 cm EKOS infusion catheter was advanced over the wire and positioned. This sheaths were secured to the skin with  0 silk suture. Thrombolysis was initiated at 1 mg/hr tPA through each catheter for a total of 2 milligrams/hour. FINDINGS: Pulmonary arterial pressure 52/21 (30) mmHg IMPRESSION: 1. Pulmonary arterial hypertension with bilateral large volume pulmonary emboli. 2. Successful initiation of bilateral pulmonary artery thrombolysis. Signed, Criselda Peaches, MD [facility] Radiology Electronically Signed   By: Jacqulynn Cadet M.D.   On: 03/19/2017 16:37   Ir US Guide Vasc Access Right  Result Date: 03/19/2017 INDICATION: 67 year old male with acute sub massive pulmonary embolism. He presents for catheter directed bilateral pulmonary artery thrombolysis. EXAM: IR INFUSION THROMBOL ARTERIAL INITIAL (MS); BILATERAL PULMONARY ARTERIOGRAPHY; IR ULTRASOUND GUIDANCE VASC ACCESS RIGHT; ADDITIONAL ARTERIOGRAPHY COMPARISON:  CTA chest 03/18/2017 MEDICATIONS: None. ANESTHESIA/SEDATION: Versed 2 mg IV; Fentanyl 100 mcg IV Moderate Sedation Time:  30 minutes The patient was continuously monitored during the procedure by the interventional radiology nurse under my direct supervision. FLUOROSCOPY TIME:  Fluoroscopy Time: 10 minutes 36 seconds (77 mGy). COMPLICATIONS: None immediate. TECHNIQUE: Informed written consent was obtained from the patient after a thorough discussion of the procedural risks, benefits and alternatives. All questions were addressed. Maximal  Sterile Barrier Technique was utilized including caps, mask, sterile gowns, sterile gloves, sterile drape, hand hygiene and skin antiseptic. A timeout was performed prior to the initiation of the procedure. The right common femoral vein was interrogated with ultrasound and found to be widely patent. An image was obtained and stored for the medical record. Local anesthesia was attained by infiltration with 1% lidocaine. A small dermatotomy was made. Under real-time sonographic guidance, the vessel was punctured with a 21 gauge micropuncture needle. Using standard technique, the initial micro needle was exchanged over a 0.018 micro wire for a transitional 4 [place] micro sheath. The micro sheath was then exchanged over a 0.035 wire for a 6 French vascular sheath. Using the exact same technique, a second 6 [place] vascular sheath was also advanced into the right common femoral artery adjacent to the first. Through the more medial sheath, a C2 cobra catheter was advanced over a Bentson wire into the right atrium, through the right ventricle and into the main pulmonary artery. A main pulmonary arteriogram was performed. The pulmonary arterial pressure was 52/21 (30) mmHg. A main pulmonary arteriogram was performed. The pulmonary arteries are grossly enlarged. Filling defects are present bilaterally. The C2 cobra catheter was then exchanged over a Rosen wire for a a 100 cm Vert catheter. The vert catheter was successfully navigated into the right main pulmonary artery. A right pulmonary arteriogram was performed. This demonstrates nearly occlusive thrombus and demonstrates the anatomy. The catheter was then successfully navigated into the right lower lobe pulmonary artery. A Rosen wire was placed. A 12 cm EKOS infusion catheter was then advanced over the wire. Through the more lateral sheath, the C2 cobra catheter was reintroduced over a Bentson wire and again navigated into the main pulmonary artery. The C2 cobra catheter  was then exchanged for a 100 cm for 2 catheter. The vert catheter was successfully navigated into the left main pulmonary artery. A pulmonary arteriogram was performed confirming nearly occlusive thrombus. The catheter was successfully navigated into the left lower lobe pulmonary artery. A Rosen wire was placed. The catheter was removed. An 18 cm EKOS infusion catheter was advanced over the wire and positioned. This sheaths were secured to the skin with 0 silk suture. Thrombolysis was initiated at 1 mg/hr tPA through each catheter for a total of 2 milligrams/hour. FINDINGS: Pulmonary arterial  pressure 52/21 (30) mmHg IMPRESSION: 1. Pulmonary arterial hypertension with bilateral large volume pulmonary emboli. 2. Successful initiation of bilateral pulmonary artery thrombolysis. Signed, Criselda Peaches, MD [facility] Radiology Electronically Signed   By: Jacqulynn Cadet M.D.   On: 03/19/2017 16:37   Ir Infusion Thrombol Arterial Initial (ms)  Result Date: 03/19/2017 INDICATION: 67 year old male with acute sub massive pulmonary embolism. He presents for catheter directed bilateral pulmonary artery thrombolysis. EXAM: IR INFUSION THROMBOL ARTERIAL INITIAL (MS); BILATERAL PULMONARY ARTERIOGRAPHY; IR ULTRASOUND GUIDANCE VASC ACCESS RIGHT; ADDITIONAL ARTERIOGRAPHY COMPARISON:  CTA chest 03/18/2017 MEDICATIONS: None. ANESTHESIA/SEDATION: Versed 2 mg IV; Fentanyl 100 mcg IV Moderate Sedation Time:  30 minutes The patient was continuously monitored during the procedure by the interventional radiology nurse under my direct supervision. FLUOROSCOPY TIME:  Fluoroscopy Time: 10 minutes 36 seconds (77 mGy). COMPLICATIONS: None immediate. TECHNIQUE: Informed written consent was obtained from the patient after a thorough discussion of the procedural risks, benefits and alternatives. All questions were addressed. Maximal Sterile Barrier Technique was utilized including caps,  mask, sterile gowns, sterile gloves, sterile drape, hand hygiene and skin antiseptic. A timeout was performed prior to the initiation of the procedure. The right common femoral vein was interrogated with ultrasound and found to be widely patent. An image was obtained and stored for the medical record. Local anesthesia was attained by infiltration with 1% lidocaine. A small dermatotomy was made. Under real-time sonographic guidance, the vessel was punctured with a 21 gauge micropuncture needle. Using standard technique, the initial micro needle was exchanged over a 0.018 micro wire for a transitional 4 [place] micro sheath. The micro sheath was then exchanged over a 0.035 wire for a 6 French vascular sheath. Using the exact same technique, a second 6 [place] vascular sheath was also advanced into the right common femoral artery adjacent to the first. Through the more medial sheath, a C2 cobra catheter was advanced over a Bentson wire into the right atrium, through the right ventricle and into the main pulmonary artery. A main pulmonary arteriogram was performed. The pulmonary arterial pressure was 52/21 (30) mmHg. A main pulmonary arteriogram was performed. The pulmonary arteries are grossly enlarged. Filling defects are present bilaterally. The C2 cobra catheter was then exchanged over a Rosen wire for a a 100 cm Vert catheter. The vert catheter was successfully navigated into the right main pulmonary artery. A right pulmonary arteriogram was performed. This demonstrates nearly occlusive thrombus and demonstrates the anatomy. The catheter was then successfully navigated into the right lower lobe pulmonary artery. A Rosen wire was placed. A 12 cm EKOS infusion catheter was then advanced over the wire. Through the more lateral sheath, the C2 cobra catheter was reintroduced over a Bentson wire and again navigated into the main pulmonary artery. The C2 cobra catheter was then exchanged for a 100 cm for 2 catheter. The  vert catheter was successfully navigated into the left main pulmonary artery. A pulmonary arteriogram was performed confirming nearly occlusive thrombus. The catheter was successfully navigated into the left lower lobe pulmonary artery. A Rosen wire was placed. The catheter was removed. An 18 cm EKOS infusion catheter was advanced over the wire and positioned. This sheaths were secured to the skin with 0 silk suture. Thrombolysis was initiated at 1 mg/hr tPA through each catheter for a total of 2 milligrams/hour. FINDINGS: Pulmonary arterial pressure 52/21 (30) mmHg IMPRESSION: 1. Pulmonary arterial hypertension with bilateral large volume pulmonary emboli. 2. Successful initiation of bilateral pulmonary artery  thrombolysis. Signed, Criselda Peaches, MD [facility] Radiology Electronically Signed   By: Jacqulynn Cadet M.D.   On: 03/19/2017 16:37   Ir Infusion Thrombol Arterial Initial (ms)  Result Date: 03/19/2017 INDICATION: 67 year old male with acute sub massive pulmonary embolism. He presents for catheter directed bilateral pulmonary artery thrombolysis. EXAM: IR INFUSION THROMBOL ARTERIAL INITIAL (MS); BILATERAL PULMONARY ARTERIOGRAPHY; IR ULTRASOUND GUIDANCE VASC ACCESS RIGHT; ADDITIONAL ARTERIOGRAPHY COMPARISON:  CTA chest 03/18/2017 MEDICATIONS: None. ANESTHESIA/SEDATION: Versed 2 mg IV; Fentanyl 100 mcg IV Moderate Sedation Time:  30 minutes The patient was continuously monitored during the procedure by the interventional radiology nurse under my direct supervision. FLUOROSCOPY TIME:  Fluoroscopy Time: 10 minutes 36 seconds (77 mGy). COMPLICATIONS: None immediate. TECHNIQUE: Informed written consent was obtained from the patient after a thorough discussion of the procedural risks, benefits and alternatives. All questions were addressed. Maximal Sterile Barrier Technique was utilized including caps, mask, sterile gowns, sterile gloves, sterile drape,  hand hygiene and skin antiseptic. A timeout was performed prior to the initiation of the procedure. The right common femoral vein was interrogated with ultrasound and found to be widely patent. An image was obtained and stored for the medical record. Local anesthesia was attained by infiltration with 1% lidocaine. A small dermatotomy was made. Under real-time sonographic guidance, the vessel was punctured with a 21 gauge micropuncture needle. Using standard technique, the initial micro needle was exchanged over a 0.018 micro wire for a transitional 4 [place] micro sheath. The micro sheath was then exchanged over a 0.035 wire for a 6 French vascular sheath. Using the exact same technique, a second 6 [place] vascular sheath was also advanced into the right common femoral artery adjacent to the first. Through the more medial sheath, a C2 cobra catheter was advanced over a Bentson wire into the right atrium, through the right ventricle and into the main pulmonary artery. A main pulmonary arteriogram was performed. The pulmonary arterial pressure was 52/21 (30) mmHg. A main pulmonary arteriogram was performed. The pulmonary arteries are grossly enlarged. Filling defects are present bilaterally. The C2 cobra catheter was then exchanged over a Rosen wire for a a 100 cm Vert catheter. The vert catheter was successfully navigated into the right main pulmonary artery. A right pulmonary arteriogram was performed. This demonstrates nearly occlusive thrombus and demonstrates the anatomy. The catheter was then successfully navigated into the right lower lobe pulmonary artery. A Rosen wire was placed. A 12 cm EKOS infusion catheter was then advanced over the wire. Through the more lateral sheath, the C2 cobra catheter was reintroduced over a Bentson wire and again navigated into the main pulmonary artery. The C2 cobra catheter was then exchanged for a 100 cm for 2 catheter. The vert catheter was successfully navigated into the left  main pulmonary artery. A pulmonary arteriogram was performed confirming nearly occlusive thrombus. The catheter was successfully navigated into the left lower lobe pulmonary artery. A Rosen wire was placed. The catheter was removed. An 18 cm EKOS infusion catheter was advanced over the wire and positioned. This sheaths were secured to the skin with 0 silk suture. Thrombolysis was initiated at 1 mg/hr tPA through each catheter for a total of 2 milligrams/hour. FINDINGS: Pulmonary arterial pressure 52/21 (30) mmHg IMPRESSION: 1. Pulmonary arterial hypertension with bilateral large volume pulmonary emboli. 2. Successful initiation of bilateral pulmonary artery thrombolysis. Signed, Criselda Peaches, MD [facility] Radiology Electronically Signed   By: Dellis Filbert.D.  On: 03/19/2017 16:37   Ir Jacolyn Reedy F/u Elizabeth Sauer Art/ven Final Day (ms)  Result Date: 03/20/2017 INDICATION: 67 year old male with a history of sub massive pulmonary embolus. He underwent initiation of bilateral pulmonary artery thrombolysis yesterday. Re-evaluate pressures and removed catheters. EXAM: IR THROMB F/U EVAL ART/VEN FINAL DAY COMPARISON:  Pulmonary arteriography and catheter placement 03/19/2017 MEDICATIONS: None. ANESTHESIA/SEDATION: None FLUOROSCOPY TIME:  None COMPLICATIONS: None immediate. TECHNIQUE: Informed written consent was obtained from the patient after a thorough discussion of the procedural risks, benefits and alternatives. All questions were addressed. A timeout was performed prior to the initiation of the procedure. The catheters were pulled back into the main pulmonary artery. Pulmonary arterial pressures were then obtained. The main pulmonary arterial pressure is 36/12 (22) mmHg. The catheters were removed. The sheaths were pulled and hemostasis attained by manual pressure. FINDINGS: The main pulmonary arterial pressure is 36/12 (22) mmHg IMPRESSION: Significantly  improved pulmonary arterial pressure with resolution of pulmonary arterial hypertension. Signed, Criselda Peaches, MD [facility] Radiology Electronically Signed   By: Jacqulynn Cadet M.D.   On: 03/20/2017 10:03   Scheduled Meds: . aspirin EC  81 mg Oral QHS  . atorvastatin  10 mg Oral Daily  . lidocaine  1 patch Transdermal Q24H  . polyethylene glycol  17 g Oral Daily  . sodium chloride flush  3 mL Intravenous Q12H   Continuous Infusions: . sodium chloride Stopped (03/20/17 0930)  . sodium chloride Stopped (03/20/17 0930)  . sodium chloride Stopped (03/20/17 0930)  . sodium chloride 10 mL/hr at 03/20/17 0930  . sodium chloride    . heparin 1,500 Units/hr (03/21/17 0713)    LOS: 3 days   Kerney Elbe, DO [facility] Pager [phone]  If 7PM-7AM, please contact night-coverage [email or web address] Password [email or web address] 03/21/2017, 2:35 PM

## 2017-03-21 NOTE — Progress Notes (Signed)
Referring Physician(s): Dr Franco Collet  Supervising Physician: Marybelle Killings  Patient Status:  [facility] - In-pt  Chief Complaint:  PE 03/19/17: Procedure: Bilateral pulmonary arteriogram, pressure measurements and initiation of bilateral pulmonary thrombolysis    Subjective:  Has done well No complaints Hard for pt to compare sob with exertion yet at this point Has walked maybe 6 feet in room---no pain; no sob Denies any other pain  Allergies: Patient has no known allergies.  Medications: Prior to Admission medications   Medication Sig Start Date End Date Taking? Authorizing Provider  amoxicillin (AMOXIL) 500 MG capsule Take 2,000 mg by mouth See admin instructions. Take 4 capsules (2000 mg) by mouth one hour prior to dental appointment (last visit 01/30/17)   Yes [provider]  aspirin EC 81 MG tablet Take 81 mg by mouth at bedtime.   Yes [provider]  atorvastatin (LIPITOR) 10 MG tablet Take 10 mg by mouth daily. 02/14/17  Yes [provider]  fluocinonide cream (LIDEX) 2.37 % Apply 1 application topically daily as needed (itching/rash (Grover's disease)).   Yes [provider]  lisinopril-hydrochlorothiazide (PRINZIDE,ZESTORETIC) 20-12.5 MG tablet Take 1 tablet by mouth daily. 02/14/17  Yes [provider]     Vital Signs: BP 120/69 (BP Location: Right Arm)   Pulse (!) 101   Temp 98.8 F (37.1 C) (Oral)   Resp (!) 22   Ht 5\' 10"  (1.778 m)   Wt 214 lb 6.4 oz (97.3 kg)   SpO2 92%   BMI 30.76 kg/m   Physical Exam  Constitutional: He is oriented to person, place, and time.  Pulmonary/Chest: Effort normal and breath sounds normal.  Musculoskeletal: Normal range of motion.  Neurological: He is alert and oriented to person, place, and time.  Skin: Skin is warm and dry.  Right groin is clean and dry NT no bleeding Rt foot 2+ pulses  Psychiatric: He has a normal mood and affect.  Nursing note and vitals  reviewed.   Imaging: Dg Chest 1 View  Result Date: 03/19/2017 CLINICAL DATA:  Pulmonary embolism post bilateral pulmonary angiogram. Abnormal respirations. History of hypertension. EXAM: CHEST 1 VIEW COMPARISON:  One 4 tooth FINDINGS: Infusion catheter is demonstrated in the pulmonary artery's bilaterally. Shallow inspiration. Cardiac enlargement. Pulmonary vascularity is normal. No airspace disease or consolidation in the lungs. No blunting of costophrenic angles. No pneumothorax. IMPRESSION: Cardiac enlargement. Shallow inspiration. No evidence of active pulmonary disease. Electronically Signed   By: Lucienne Capers M.D.   On: 03/19/2017 19:08   Dg Chest 2 View  Result Date: 03/18/2017 CLINICAL DATA:  Shortness of breath and chest pain. EXAM: CHEST  2 VIEW COMPARISON:  None. FINDINGS: The lungs are clear. Heart size is upper normal. No pneumothorax or pleural fluid. No bony abnormality. IMPRESSION: No acute disease. Electronically Signed   By: Inge Rise M.D.   On: 03/18/2017 13:58   Ct Angio Chest Pe W And/or Wo Contrast  Result Date: 03/18/2017 CLINICAL DATA:  Chest pain.  Shortness of breath. EXAM: CT ANGIOGRAPHY CHEST WITH CONTRAST TECHNIQUE: Multidetector CT imaging of the chest was performed using the standard protocol during bolus administration of intravenous contrast. Multiplanar CT image reconstructions and MIPs were obtained to evaluate the vascular anatomy. CONTRAST:  4mL ISOVUE-370 IOPAMIDOL (ISOVUE-370) INJECTION 76% COMPARISON:  Chest radiograph 03/18/2017 FINDINGS: Cardiovascular: Large central bilateral pulmonary emboli. Nearly occlusive left main pulmonary artery embolus, extending to all 3 left-sided lobar arteries, and nonocclusive into the right main pulmonary artery,  extending to the right upper lobe and right lower lobar pulmonary arteries, and several segmental branches. Enlargement of the right ventricle with RV/ LV ratio 1.5, evidence of significant right heart strain.  Normal caliber of the aorta. Mediastinum/Nodes: No enlarged mediastinal, hilar, or axillary lymph nodes. Thyroid gland, trachea, and esophagus demonstrate no significant findings. Lungs/Pleura: Lungs are clear. No pleural effusion or pneumothorax. Upper Abdomen: No acute abnormality. Musculoskeletal: No chest wall abnormality. No acute or significant osseous findings. Review of the MIP images confirms the above findings. IMPRESSION: Large central bilateral pulmonary embolus, which is nearly occlusive in the left main pulmonary artery extending to all 3 left-sided lobar arterial branches. Nonocclusive right main pulmonary artery embolus extending to the right upper lobar and right lower lobar pulmonary arteries. Heavy clot burden with marked right heart strain with RV/LV ratio of 1.5. Clear lungs. Critical Value/emergent results were called by telephone at the time of interpretation on 03/18/2017 at 4:09 pm to Dr. Pattricia Boss, who verbally acknowledged these results. Electronically Signed   By: Fidela Salisbury M.D.   On: 03/18/2017 16:10   Ir Angiogram Pulmonary Bilateral Selective  Result Date: 03/19/2017 INDICATION: 67 year old male with acute sub massive pulmonary embolism. He presents for catheter directed bilateral pulmonary artery thrombolysis. EXAM: IR INFUSION THROMBOL ARTERIAL INITIAL (MS); BILATERAL PULMONARY ARTERIOGRAPHY; IR ULTRASOUND GUIDANCE VASC ACCESS RIGHT; ADDITIONAL ARTERIOGRAPHY COMPARISON:  CTA chest 03/18/2017 MEDICATIONS: None. ANESTHESIA/SEDATION: Versed 2 mg IV; Fentanyl 100 mcg IV Moderate Sedation Time:  30 minutes The patient was continuously monitored during the procedure by the interventional radiology nurse under my direct supervision. FLUOROSCOPY TIME:  Fluoroscopy Time: 10 minutes 36 seconds (77 mGy). COMPLICATIONS: None immediate. TECHNIQUE: Informed written consent was obtained from the patient after a thorough discussion of the procedural risks, benefits and alternatives.  All questions were addressed. Maximal Sterile Barrier Technique was utilized including caps, mask, sterile gowns, sterile gloves, sterile drape, hand hygiene and skin antiseptic. A timeout was performed prior to the initiation of the procedure. The right common femoral vein was interrogated with ultrasound and found to be widely patent. An image was obtained and stored for the medical record. Local anesthesia was attained by infiltration with 1% lidocaine. A small dermatotomy was made. Under real-time sonographic guidance, the vessel was punctured with a 21 gauge micropuncture needle. Using standard technique, the initial micro needle was exchanged over a 0.018 micro wire for a transitional 4 [place] micro sheath. The micro sheath was then exchanged over a 0.035 wire for a 6 French vascular sheath. Using the exact same technique, a second 6 [place] vascular sheath was also advanced into the right common femoral artery adjacent to the first. Through the more medial sheath, a C2 cobra catheter was advanced over a Bentson wire into the right atrium, through the right ventricle and into the main pulmonary artery. A main pulmonary arteriogram was performed. The pulmonary arterial pressure was 52/21 (30) mmHg. A main pulmonary arteriogram was performed. The pulmonary arteries are grossly enlarged. Filling defects are present bilaterally. The C2 cobra catheter was then exchanged over a Rosen wire for a a 100 cm Vert catheter. The vert catheter was successfully navigated into the right main pulmonary artery. A right pulmonary arteriogram was performed. This demonstrates nearly occlusive thrombus and demonstrates the anatomy. The catheter was then successfully navigated into the right lower lobe pulmonary artery. A Rosen wire was placed. A 12 cm EKOS infusion catheter was then advanced over the wire. Through the more lateral sheath, the C2 cobra  catheter was reintroduced over a Bentson wire and again navigated into the main  pulmonary artery. The C2 cobra catheter was then exchanged for a 100 cm for 2 catheter. The vert catheter was successfully navigated into the left main pulmonary artery. A pulmonary arteriogram was performed confirming nearly occlusive thrombus. The catheter was successfully navigated into the left lower lobe pulmonary artery. A Rosen wire was placed. The catheter was removed. An 18 cm EKOS infusion catheter was advanced over the wire and positioned. This sheaths were secured to the skin with 0 silk suture. Thrombolysis was initiated at 1 mg/hr tPA through each catheter for a total of 2 milligrams/hour. FINDINGS: Pulmonary arterial pressure 52/21 (30) mmHg IMPRESSION: 1. Pulmonary arterial hypertension with bilateral large volume pulmonary emboli. 2. Successful initiation of bilateral pulmonary artery thrombolysis. Signed, Criselda Peaches, MD [facility] Electronically Signed   By: Jacqulynn Cadet M.D.   On: 03/19/2017 16:37   Ir Angiogram Selective Each Additional Vessel  Result Date: 03/19/2017 INDICATION: 67 year old male with acute sub massive pulmonary embolism. He presents for catheter directed bilateral pulmonary artery thrombolysis. EXAM: ADDITIONAL ARTERIOGRAPHY COMPARISON:  CTA chest 03/18/2017 MEDICATIONS: None. ANESTHESIA/SEDATION: Versed 2 mg IV; Fentanyl 100 mcg IV Moderate Sedation Time: 30 minutes The patient was continuously monitored during the procedure by the interventional radiology nurse under my direct supervision. FLUOROSCOPY TIME:  Fluoroscopy Time: 10 minutes 36 seconds (77 mGy). COMPLICATIONS: None immediate. TECHNIQUE: Informed written consent was obtained from the patient after a thorough discussion of the procedural risks, benefits and alternatives. All questions were addressed. Maximal Sterile Barrier Technique was utilized including caps, mask, sterile gowns, sterile gloves, sterile drape, hand hygiene and skin  antiseptic. A timeout was performed prior to the initiation of the procedure. The right common femoral vein was interrogated with ultrasound and found to be widely patent. An image was obtained and stored for the medical record. Local anesthesia was attained by infiltration with 1% lidocaine. A small dermatotomy was made. Under real-time sonographic guidance, the vessel was punctured with a 21 gauge micropuncture needle. Using standard technique, the initial micro needle was exchanged over a 0.018 micro wire for a transitional 4 [place] micro sheath. The micro sheath was then exchanged over a 0.035 wire for a 6 French vascular sheath. Using the exact same technique, a second 6 [place] vascular sheath was also advanced into the right common femoral artery adjacent to the first. Through the more medial sheath, a C2 cobra catheter was advanced over a Bentson wire into the right atrium, through the right ventricle and into the main pulmonary artery. A main pulmonary arteriogram was performed. The pulmonary arterial pressure was 52/21 (30) mmHg. A main pulmonary arteriogram was performed. The pulmonary arteries are grossly enlarged. Filling defects are present bilaterally. The C2 cobra catheter was then exchanged over a Rosen wire for a a 100 cm Vert catheter. The vert catheter was successfully navigated into the right main pulmonary artery. A right pulmonary arteriogram was performed. This demonstrates nearly occlusive thrombus and demonstrates the anatomy. The catheter was then successfully navigated into the right lower lobe pulmonary artery. A Rosen wire was placed. A 12 cm EKOS infusion catheter was then advanced over the wire. Through the more lateral sheath, the C2 cobra catheter was reintroduced over a Bentson wire and again navigated into the main pulmonary artery. The C2 cobra catheter was then exchanged for a 100 cm for 2 catheter. The vert catheter was successfully navigated into the left  main pulmonary artery.  A pulmonary arteriogram was performed confirming nearly occlusive thrombus. The catheter was successfully navigated into the left lower lobe pulmonary artery. A Rosen wire was placed. The catheter was removed. An 18 cm EKOS infusion catheter was advanced over the wire and positioned. This sheaths were secured to the skin with 0 silk suture. Thrombolysis was initiated at 1 mg/hr tPA through each catheter for a total of 2 milligrams/hour. FINDINGS: Pulmonary arterial pressure 52/21 (30) mmHg IMPRESSION: 1. Pulmonary arterial hypertension with bilateral large volume pulmonary emboli. 2. Successful initiation of bilateral pulmonary artery thrombolysis. Electronically Signed   By: Jacqulynn Cadet M.D.   On: 03/19/2017 16:44   Ir Angiogram Selective Each Additional Vessel  Result Date: 03/19/2017 INDICATION: 67 year old male with acute sub massive pulmonary embolism. He presents for catheter directed bilateral pulmonary artery thrombolysis. EXAM: IR INFUSION THROMBOL ARTERIAL INITIAL (MS); BILATERAL PULMONARY ARTERIOGRAPHY; IR ULTRASOUND GUIDANCE VASC ACCESS RIGHT; ADDITIONAL ARTERIOGRAPHY COMPARISON:  CTA chest 03/18/2017 MEDICATIONS: None. ANESTHESIA/SEDATION: Versed 2 mg IV; Fentanyl 100 mcg IV Moderate Sedation Time:  30 minutes The patient was continuously monitored during the procedure by the interventional radiology nurse under my direct supervision. FLUOROSCOPY TIME:  Fluoroscopy Time: 10 minutes 36 seconds (77 mGy). COMPLICATIONS: None immediate. TECHNIQUE: Informed written consent was obtained from the patient after a thorough discussion of the procedural risks, benefits and alternatives. All questions were addressed. Maximal Sterile Barrier Technique was utilized including caps, mask, sterile gowns, sterile gloves, sterile drape, hand hygiene and skin antiseptic. A timeout was performed prior to the initiation of the procedure. The right common femoral vein was interrogated with ultrasound and found to be  widely patent. An image was obtained and stored for the medical record. Local anesthesia was attained by infiltration with 1% lidocaine. A small dermatotomy was made. Under real-time sonographic guidance, the vessel was punctured with a 21 gauge micropuncture needle. Using standard technique, the initial micro needle was exchanged over a 0.018 micro wire for a transitional 4 [place] micro sheath. The micro sheath was then exchanged over a 0.035 wire for a 6 French vascular sheath. Using the exact same technique, a second 6 [place] vascular sheath was also advanced into the right common femoral artery adjacent to the first. Through the more medial sheath, a C2 cobra catheter was advanced over a Bentson wire into the right atrium, through the right ventricle and into the main pulmonary artery. A main pulmonary arteriogram was performed. The pulmonary arterial pressure was 52/21 (30) mmHg. A main pulmonary arteriogram was performed. The pulmonary arteries are grossly enlarged. Filling defects are present bilaterally. The C2 cobra catheter was then exchanged over a Rosen wire for a a 100 cm Vert catheter. The vert catheter was successfully navigated into the right main pulmonary artery. A right pulmonary arteriogram was performed. This demonstrates nearly occlusive thrombus and demonstrates the anatomy. The catheter was then successfully navigated into the right lower lobe pulmonary artery. A Rosen wire was placed. A 12 cm EKOS infusion catheter was then advanced over the wire. Through the more lateral sheath, the C2 cobra catheter was reintroduced over a Bentson wire and again navigated into the main pulmonary artery. The C2 cobra catheter was then exchanged for a 100 cm for 2 catheter. The vert catheter was successfully navigated into the left main pulmonary artery. A pulmonary arteriogram was performed confirming nearly occlusive thrombus. The catheter was successfully navigated into the left lower lobe pulmonary  artery. A Rosen wire was placed. The catheter was removed.  An 18 cm EKOS infusion catheter was advanced over the wire and positioned. This sheaths were secured to the skin with 0 silk suture. Thrombolysis was initiated at 1 mg/hr tPA through each catheter for a total of 2 milligrams/hour. FINDINGS: Pulmonary arterial pressure 52/21 (30) mmHg IMPRESSION: 1. Pulmonary arterial hypertension with bilateral large volume pulmonary emboli. 2. Successful initiation of bilateral pulmonary artery thrombolysis. Signed, Criselda Peaches, MD [facility] Electronically Signed   By: Jacqulynn Cadet M.D.   On: 03/19/2017 16:37   Ir US Guide Vasc Access Right  Result Date: 03/19/2017 INDICATION: 67 year old male with acute sub massive pulmonary embolism. He presents for catheter directed bilateral pulmonary artery thrombolysis. EXAM: IR INFUSION THROMBOL ARTERIAL INITIAL (MS); BILATERAL PULMONARY ARTERIOGRAPHY; IR ULTRASOUND GUIDANCE VASC ACCESS RIGHT; ADDITIONAL ARTERIOGRAPHY COMPARISON:  CTA chest 03/18/2017 MEDICATIONS: None. ANESTHESIA/SEDATION: Versed 2 mg IV; Fentanyl 100 mcg IV Moderate Sedation Time:  30 minutes The patient was continuously monitored during the procedure by the interventional radiology nurse under my direct supervision. FLUOROSCOPY TIME:  Fluoroscopy Time: 10 minutes 36 seconds (77 mGy). COMPLICATIONS: None immediate. TECHNIQUE: Informed written consent was obtained from the patient after a thorough discussion of the procedural risks, benefits and alternatives. All questions were addressed. Maximal Sterile Barrier Technique was utilized including caps, mask, sterile gowns, sterile gloves, sterile drape, hand hygiene and skin antiseptic. A timeout was performed prior to the initiation of the procedure. The right common femoral vein was interrogated with ultrasound and found to be widely patent. An image was obtained and stored for the medical  record. Local anesthesia was attained by infiltration with 1% lidocaine. A small dermatotomy was made. Under real-time sonographic guidance, the vessel was punctured with a 21 gauge micropuncture needle. Using standard technique, the initial micro needle was exchanged over a 0.018 micro wire for a transitional 4 [place] micro sheath. The micro sheath was then exchanged over a 0.035 wire for a 6 French vascular sheath. Using the exact same technique, a second 6 [place] vascular sheath was also advanced into the right common femoral artery adjacent to the first. Through the more medial sheath, a C2 cobra catheter was advanced over a Bentson wire into the right atrium, through the right ventricle and into the main pulmonary artery. A main pulmonary arteriogram was performed. The pulmonary arterial pressure was 52/21 (30) mmHg. A main pulmonary arteriogram was performed. The pulmonary arteries are grossly enlarged. Filling defects are present bilaterally. The C2 cobra catheter was then exchanged over a Rosen wire for a a 100 cm Vert catheter. The vert catheter was successfully navigated into the right main pulmonary artery. A right pulmonary arteriogram was performed. This demonstrates nearly occlusive thrombus and demonstrates the anatomy. The catheter was then successfully navigated into the right lower lobe pulmonary artery. A Rosen wire was placed. A 12 cm EKOS infusion catheter was then advanced over the wire. Through the more lateral sheath, the C2 cobra catheter was reintroduced over a Bentson wire and again navigated into the main pulmonary artery. The C2 cobra catheter was then exchanged for a 100 cm for 2 catheter. The vert catheter was successfully navigated into the left main pulmonary artery. A pulmonary arteriogram was performed confirming nearly occlusive thrombus. The catheter was successfully navigated into the left lower lobe pulmonary artery. A Rosen wire was placed. The catheter was removed. An 18 cm  EKOS infusion catheter was advanced over the wire and positioned. This sheaths were secured to the skin with 0  silk suture. Thrombolysis was initiated at 1 mg/hr tPA through each catheter for a total of 2 milligrams/hour. FINDINGS: Pulmonary arterial pressure 52/21 (30) mmHg IMPRESSION: 1. Pulmonary arterial hypertension with bilateral large volume pulmonary emboli. 2. Successful initiation of bilateral pulmonary artery thrombolysis. Signed, Criselda Peaches, MD [facility] Electronically Signed   By: Jacqulynn Cadet M.D.   On: 03/19/2017 16:37   Ir Infusion Thrombol Arterial Initial (ms)  Result Date: 03/19/2017 INDICATION: 67 year old male with acute sub massive pulmonary embolism. He presents for catheter directed bilateral pulmonary artery thrombolysis. EXAM: IR INFUSION THROMBOL ARTERIAL INITIAL (MS); BILATERAL PULMONARY ARTERIOGRAPHY; IR ULTRASOUND GUIDANCE VASC ACCESS RIGHT; ADDITIONAL ARTERIOGRAPHY COMPARISON:  CTA chest 03/18/2017 MEDICATIONS: None. ANESTHESIA/SEDATION: Versed 2 mg IV; Fentanyl 100 mcg IV Moderate Sedation Time:  30 minutes The patient was continuously monitored during the procedure by the interventional radiology nurse under my direct supervision. FLUOROSCOPY TIME:  Fluoroscopy Time: 10 minutes 36 seconds (77 mGy). COMPLICATIONS: None immediate. TECHNIQUE: Informed written consent was obtained from the patient after a thorough discussion of the procedural risks, benefits and alternatives. All questions were addressed. Maximal Sterile Barrier Technique was utilized including caps, mask, sterile gowns, sterile gloves, sterile drape, hand hygiene and skin antiseptic. A timeout was performed prior to the initiation of the procedure. The right common femoral vein was interrogated with ultrasound and found to be widely patent. An image was obtained and stored for the medical record. Local anesthesia was attained by infiltration  with 1% lidocaine. A small dermatotomy was made. Under real-time sonographic guidance, the vessel was punctured with a 21 gauge micropuncture needle. Using standard technique, the initial micro needle was exchanged over a 0.018 micro wire for a transitional 4 [place] micro sheath. The micro sheath was then exchanged over a 0.035 wire for a 6 French vascular sheath. Using the exact same technique, a second 6 [place] vascular sheath was also advanced into the right common femoral artery adjacent to the first. Through the more medial sheath, a C2 cobra catheter was advanced over a Bentson wire into the right atrium, through the right ventricle and into the main pulmonary artery. A main pulmonary arteriogram was performed. The pulmonary arterial pressure was 52/21 (30) mmHg. A main pulmonary arteriogram was performed. The pulmonary arteries are grossly enlarged. Filling defects are present bilaterally. The C2 cobra catheter was then exchanged over a Rosen wire for a a 100 cm Vert catheter. The vert catheter was successfully navigated into the right main pulmonary artery. A right pulmonary arteriogram was performed. This demonstrates nearly occlusive thrombus and demonstrates the anatomy. The catheter was then successfully navigated into the right lower lobe pulmonary artery. A Rosen wire was placed. A 12 cm EKOS infusion catheter was then advanced over the wire. Through the more lateral sheath, the C2 cobra catheter was reintroduced over a Bentson wire and again navigated into the main pulmonary artery. The C2 cobra catheter was then exchanged for a 100 cm for 2 catheter. The vert catheter was successfully navigated into the left main pulmonary artery. A pulmonary arteriogram was performed confirming nearly occlusive thrombus. The catheter was successfully navigated into the left lower lobe pulmonary artery. A Rosen wire was placed. The catheter was removed. An 18 cm EKOS infusion catheter was advanced over the wire and  positioned. This sheaths were secured to the skin with 0 silk suture. Thrombolysis was initiated at 1 mg/hr tPA through each catheter for a total of 2 milligrams/hour. FINDINGS: Pulmonary arterial pressure  52/21 (30) mmHg IMPRESSION: 1. Pulmonary arterial hypertension with bilateral large volume pulmonary emboli. 2. Successful initiation of bilateral pulmonary artery thrombolysis. Signed, Criselda Peaches, MD [facility] Electronically Signed   By: Jacqulynn Cadet M.D.   On: 03/19/2017 16:37   Ir Infusion Thrombol Arterial Initial (ms)  Result Date: 03/19/2017 INDICATION: 67 year old male with acute sub massive pulmonary embolism. He presents for catheter directed bilateral pulmonary artery thrombolysis. EXAM: IR INFUSION THROMBOL ARTERIAL INITIAL (MS); BILATERAL PULMONARY ARTERIOGRAPHY; IR ULTRASOUND GUIDANCE VASC ACCESS RIGHT; ADDITIONAL ARTERIOGRAPHY COMPARISON:  CTA chest 03/18/2017 MEDICATIONS: None. ANESTHESIA/SEDATION: Versed 2 mg IV; Fentanyl 100 mcg IV Moderate Sedation Time:  30 minutes The patient was continuously monitored during the procedure by the interventional radiology nurse under my direct supervision. FLUOROSCOPY TIME:  Fluoroscopy Time: 10 minutes 36 seconds (77 mGy). COMPLICATIONS: None immediate. TECHNIQUE: Informed written consent was obtained from the patient after a thorough discussion of the procedural risks, benefits and alternatives. All questions were addressed. Maximal Sterile Barrier Technique was utilized including caps, mask, sterile gowns, sterile gloves, sterile drape, hand hygiene and skin antiseptic. A timeout was performed prior to the initiation of the procedure. The right common femoral vein was interrogated with ultrasound and found to be widely patent. An image was obtained and stored for the medical record. Local anesthesia was attained by infiltration with 1% lidocaine. A small dermatotomy was made. Under  real-time sonographic guidance, the vessel was punctured with a 21 gauge micropuncture needle. Using standard technique, the initial micro needle was exchanged over a 0.018 micro wire for a transitional 4 [place] micro sheath. The micro sheath was then exchanged over a 0.035 wire for a 6 French vascular sheath. Using the exact same technique, a second 6 [place] vascular sheath was also advanced into the right common femoral artery adjacent to the first. Through the more medial sheath, a C2 cobra catheter was advanced over a Bentson wire into the right atrium, through the right ventricle and into the main pulmonary artery. A main pulmonary arteriogram was performed. The pulmonary arterial pressure was 52/21 (30) mmHg. A main pulmonary arteriogram was performed. The pulmonary arteries are grossly enlarged. Filling defects are present bilaterally. The C2 cobra catheter was then exchanged over a Rosen wire for a a 100 cm Vert catheter. The vert catheter was successfully navigated into the right main pulmonary artery. A right pulmonary arteriogram was performed. This demonstrates nearly occlusive thrombus and demonstrates the anatomy. The catheter was then successfully navigated into the right lower lobe pulmonary artery. A Rosen wire was placed. A 12 cm EKOS infusion catheter was then advanced over the wire. Through the more lateral sheath, the C2 cobra catheter was reintroduced over a Bentson wire and again navigated into the main pulmonary artery. The C2 cobra catheter was then exchanged for a 100 cm for 2 catheter. The vert catheter was successfully navigated into the left main pulmonary artery. A pulmonary arteriogram was performed confirming nearly occlusive thrombus. The catheter was successfully navigated into the left lower lobe pulmonary artery. A Rosen wire was placed. The catheter was removed. An 18 cm EKOS infusion catheter was advanced over the wire and positioned. This sheaths were secured to the skin with 0  silk suture. Thrombolysis was initiated at 1 mg/hr tPA through each catheter for a total of 2 milligrams/hour. FINDINGS: Pulmonary arterial pressure 52/21 (30) mmHg IMPRESSION: 1. Pulmonary arterial hypertension with bilateral large volume pulmonary emboli. 2. Successful initiation of bilateral pulmonary artery thrombolysis.  Signed, Criselda Peaches, MD [facility] Radiology Electronically Signed   By: Jacqulynn Cadet M.D.   On: 03/19/2017 16:37   Ir Jacolyn Reedy F/u Elizabeth Sauer Art/ven Final Day (ms)  Result Date: 03/20/2017 INDICATION: 67 year old male with a history of sub massive pulmonary embolus. He underwent initiation of bilateral pulmonary artery thrombolysis yesterday. Re-evaluate pressures and removed catheters. EXAM: IR THROMB F/U EVAL ART/VEN FINAL DAY COMPARISON:  Pulmonary arteriography and catheter placement 03/19/2017 MEDICATIONS: None. ANESTHESIA/SEDATION: None FLUOROSCOPY TIME:  None COMPLICATIONS: None immediate. TECHNIQUE: Informed written consent was obtained from the patient after a thorough discussion of the procedural risks, benefits and alternatives. All questions were addressed. A timeout was performed prior to the initiation of the procedure. The catheters were pulled back into the main pulmonary artery. Pulmonary arterial pressures were then obtained. The main pulmonary arterial pressure is 36/12 (22) mmHg. The catheters were removed. The sheaths were pulled and hemostasis attained by manual pressure. FINDINGS: The main pulmonary arterial pressure is 36/12 (22) mmHg IMPRESSION: Significantly improved pulmonary arterial pressure with resolution of pulmonary arterial hypertension. Signed, Criselda Peaches, MD [facility] Electronically Signed   By: Jacqulynn Cadet M.D.   On: 03/20/2017 10:03    Labs:  CBC: Recent Labs    03/20/17 0034 03/20/17 0718 03/20/17 1541  03/21/17 0144  WBC 9.0 8.2 6.3 7.2  HGB 13.0 12.8* 12.6* 12.3*  HCT 40.1 39.8 39.5 39.0  PLT 153 137* 135* 153    COAGS: No results for input(s): INR, APTT in the last 8760 hours.  BMP: Recent Labs    03/18/17 1331 03/20/17 0034  NA 136 132*  K 4.6 4.0  CL 102 104  CO2 23 22  GLUCOSE 101* 93  BUN 18 16  CALCIUM 9.6 8.2*  CREATININE 1.23 1.03  GFRNONAA >60 >60  GFRAA >60 >60    LIVER FUNCTION TESTS: Recent Labs    03/18/17 1331  BILITOT 2.4*  AST 21  ALT 24  ALKPHOS 63  PROT 7.6  ALBUMIN 3.7    Assessment and Plan:  PE Lysis in IR 1/5-6 Doing well Plan per CCM  Electronically Signed: Deondra Wigger A, PA-C 03/21/2017, 8:40 AM   I spent a total of 15 Minutes at the the patient's bedside AND on the patient's [facility] floor or unit, greater than 50% of which was counseling/coordinating care for PE thrombolysis

## 2017-03-21 NOTE — Care Management (Addendum)
1021 03-22-17 Case Management Referral for Eliquis: Please run cost of Apixaban 10 mg po BID x 7 days and then 5 mg po BID afterwards.  CM did call Insurance provider and cost for Eliquis will be $150.00 as well. PA will be needed. -CM did discuss cost with family and pt chose Xarelto. Per pt he has some samples of the 15 mg tablet. CM did provide the patient with 30 day free card and $10.00 co pay card. [facility] [place] and Xarelto starter kit is not available.  CM did call [facility] in [place] and [employer] is available. Prior Authorization # [id number]. CM will make MD aware in pharmacy change and choice of Medication. No further needs from CM at this time. Bethena Roys, [place] [phone]     Benefits Check completed for Xarelto: [facility]  @ [facility] RX # [phone]   1. XARELTO 15 MG BID  COVER- YES  CO-PAY- $ 150.00  PRIOR APPROVAL- YES # [phone]    2. XARELTO 20 MG DAILY  COVER- YES  CO-PAY- $ 150.00  PRIOR APPROVAL YES # [phone]   [facility]ARM[facility]   CM will make pt aware of cost and MD in regards to prior authorization. CM will provide pt with 30 day free/ co pay card. No further needs from CM at this time.

## 2017-03-21 NOTE — Progress Notes (Signed)
Ambulated in hallway 660 feet.  Oxygen saturation 95%-97% on RA.  Patient tolerated very well.  Denied shortness of breath.

## 2017-03-22 LAB — COMPREHENSIVE METABOLIC PANEL
ALK PHOS: 48 U/L (ref 38–126)
ALT: 27 U/L (ref 17–63)
ANION GAP: 8 (ref 5–15)
AST: 23 U/L (ref 15–41)
Albumin: 3 g/dL — ABNORMAL LOW (ref 3.5–5.0)
BILIRUBIN TOTAL: 1.2 mg/dL (ref 0.3–1.2)
BUN: 7 mg/dL (ref 6–20)
CALCIUM: 8.8 mg/dL — AB (ref 8.9–10.3)
CO2: 25 mmol/L (ref 22–32)
Chloride: 104 mmol/L (ref 101–111)
Creatinine, Ser: 0.92 mg/dL (ref 0.61–1.24)
GFR calc Af Amer: >60 mL/min (ref >60)
Glucose, Bld: 117 mg/dL — ABNORMAL HIGH (ref 65–99)
POTASSIUM: 3.6 mmol/L (ref 3.5–5.1)
Sodium: 137 mmol/L (ref 135–145)
TOTAL PROTEIN: 6.1 g/dL — AB (ref 6.5–8.1)

## 2017-03-22 LAB — CBC WITH DIFFERENTIAL/PLATELET
Basophils Absolute: 0 10*3/uL (ref 0.0–0.1)
Basophils Relative: 0 %
Eosinophils Absolute: 0.4 10*3/uL (ref 0.0–0.7)
Eosinophils Relative: 6 %
HEMATOCRIT: 37.9 % — AB (ref 39.0–52.0)
Hemoglobin: 12.1 g/dL — ABNORMAL LOW (ref 13.0–17.0)
LYMPHS PCT: 18 %
Lymphs Abs: 1.3 10*3/uL (ref 0.7–4.0)
MCH: 29.4 pg (ref 26.0–34.0)
MCHC: 31.9 g/dL (ref 30.0–36.0)
MCV: 92 fL (ref 78.0–100.0)
MONOS PCT: 8 %
Monocytes Absolute: 0.5 10*3/uL (ref 0.1–1.0)
NEUTROS ABS: 4.8 10*3/uL (ref 1.7–7.7)
Neutrophils Relative %: 68 %
Platelets: 199 10*3/uL (ref 150–400)
RBC: 4.12 MIL/uL — ABNORMAL LOW (ref 4.22–5.81)
RDW: 12.7 % (ref 11.5–15.5)
WBC: 7 10*3/uL (ref 4.0–10.5)

## 2017-03-22 LAB — LIPID PANEL
CHOLESTEROL: 130 mg/dL (ref 0–200)
HDL: 35 mg/dL — ABNORMAL LOW (ref >40)
LDL Cholesterol: 69 mg/dL (ref 0–99)
TRIGLYCERIDES: 129 mg/dL (ref <150)
Total CHOL/HDL Ratio: 3.7 RATIO
VLDL: 26 mg/dL (ref 0–40)

## 2017-03-22 LAB — HEMOGLOBIN A1C
HEMOGLOBIN A1C: 5.6 % (ref 4.8–5.6)
MEAN PLASMA GLUCOSE: 114.02 mg/dL

## 2017-03-22 LAB — MAGNESIUM: Magnesium: 2 mg/dL (ref 1.7–2.4)

## 2017-03-22 LAB — HEPARIN LEVEL (UNFRACTIONATED): Heparin Unfractionated: 0.28 IU/mL — ABNORMAL LOW (ref 0.30–0.70)

## 2017-03-22 LAB — PHOSPHORUS: PHOSPHORUS: 2.9 mg/dL (ref 2.5–4.6)

## 2017-03-22 MED ORDER — RIVAROXABAN (XARELTO) VTE STARTER PACK (15 & 20 MG): ORAL_TABLET | ORAL | 0 refills | Status: DC

## 2017-03-22 MED ORDER — RIVAROXABAN 15 MG PO TABS
15.0000 mg | ORAL_TABLET | ORAL | Status: AC
  Administered 2017-03-22: 15 mg via ORAL
  Filled 2017-03-22: qty 1

## 2017-03-22 NOTE — Progress Notes (Signed)
ANTICOAGULATION CONSULT NOTE - Follow-Up Consult  Pharmacy Consult for heparin >> Xarelto Indication: pulmonary embolus and DVT, Factor V Leiden deficiency  No Known Allergies  Patient Measurements: Height: 5\' 10"  (177.8 cm) Weight: 216 lb 1.6 oz (98 kg) IBW/kg (Calculated) : 73 Heparin Dosing Weight: 92kg  Vital Signs: Temp: 98.4 F (36.9 C) (01/08 0445) Temp Source: Oral (01/08 0445) BP: 132/90 (01/08 0445) Pulse Rate: 105 (01/08 0445)  Labs: Recent Labs    03/20/17 0034 03/20/17 0718 03/20/17 1541  03/21/17 0144 03/21/17 0816 03/22/17 0436  HGB 13.0 12.8* 12.6*  --  12.3*  --  12.1*  HCT 40.1 39.8 39.5  --  39.0  --  37.9*  PLT 153 137* 135*  --  153  --  199  HEPARINUNFRC  --  0.45 0.30   < > 0.35 0.44 0.28*  CREATININE 1.03  --   --   --  1.00  --  0.92  TROPONINI  --  0.04*  --   --   --   --   --    < > = values in this interval not displayed.    Estimated Creatinine Clearance: 95.2 mL/min (by C-G formula based on SCr of 0.92 mg/dL).   Medical History: Past Medical History:  Diagnosis Date  . Hypertension     Medications:  Infusions:  . sodium chloride Stopped (03/20/17 0930)  . sodium chloride Stopped (03/20/17 0930)  . sodium chloride Stopped (03/20/17 0930)  . sodium chloride 10 mL/hr at 03/20/17 0930  . sodium chloride      Assessment: 67 yo M presented to the ED on 1/4 with SOB. Found to have large bilateral PE with right heart strain. Started IV heparin and received catheter directed thrombolytic (EKOS) 1/5 > 1/6.   Plan to transition to Xarelto today.   Goal of Therapy:  Heparin level 0.3-0.7 units/ml Monitor platelets by anticoagulation protocol: Yes   Plan:  Discontinue heparin and associated labs. Xarelto 15mg  PO BID x 21 days, then 20mg  daily with food. Pt and spouse were educated on Xarelto and provided discount cards to obtain medication. All questions answered.  [employer], Pharm.D., BCPS Clinical Pharmacist Pager:  [phone] Clinical phone for 03/22/2017 from 8:30-4:00 is [phone]. After 4pm, please call Main Rx [phone]) for assistance. 03/22/2017 11:30 AM

## 2017-03-22 NOTE — Discharge Summary (Signed)
Physician Discharge Summary  Jose Bowers MBW:[id number] DOB: 06-Jun-1950 DOA: 03/18/2017  PCP: Wenda Low, MD  Admit date: 03/18/2017 Discharge date: 03/22/2017  Admitted From: Home Disposition: Home  Recommendations for Outpatient Follow-up:  1. Follow up with PCP in 1-2 weeks for medication adjustments and transitioning off of ACE-I because of cough 2. Follow up with Oncology Dr. Burr Medico within 1 month for Hypercoagulable workup 3. Please obtain CMP/CBC, Mag, Phos in one week 4. Please follow up on the following pending results:  Home Health: No Equipment/Devices: None    Discharge Condition: Stable CODE STATUS: FULL CODE Diet recommendation: Heart Healthy Diet  Brief/Interim Summary: The patient is a 67 year old male with past medical history, which is significant for hypertension, hyperlipidemia, and factor V Leiden, and other comorbids. He does have a history of DVT after shoulder surgery about 5 years ago, which was treated with Xarelto. Recent course is insignificant until about 3 weeks ago when he developed left knee pain and was seen in primary care and given a steroid injection. Reports symptoms resolved at that time. About 10 days prior to arrival he began to experience dyspnea and dyspnea was progressive over the following 10 days with occasional chest tightness. Until he was unable to walk throughout his house or put on his socks without becoming short of breath.   He presented to [facility] emergency department 1/4 with this complaint. He had a CT angiogram of his chest performed, which demonstrated saddle PE with large clot burden. RV/LV ratio 1.5. He denies any recent injury and travel. EDP contacted interventional radiology for consideration of EKOS and PCCM has been asked to see for admission. He underwent EKOS and heparinization and was deemed stable to transfer out of ICU to [facility] 1/7. He was currently still on Heparin gtt and transitioned to po  Anticoagulation this AM to Xarelto after Case Manager ran numbers for cost. Patient ambulated without any O2 needs and was deemed medically stable to D/C Home and follow up with PCP and Hematology as an outpatient.      Discharge Diagnoses:  Active Problems:   Pulmonary embolism (HCC)   Deep venous thrombosis of left profunda femoris vein (HCC)   Acute respiratory failure with hypoxia (HCC)   Elevated troponin   Essential hypertension   Factor V Leiden (HCC)   Normocytic anemia   Hyperglycemia   Hypomagnesemia   Hyperbilirubinemia   Back muscle spasm   Trigger point of left shoulder region  Acute Respiratory Failure with Hypoxia 2/2 to Saddle PE -Improved -Continue with Supplemental O2 as needed and Wean as tolerated -Continuous Pulse Oximetry and Maintain O2 Saturations >92% -Ambulatory Pusle Oximetry Screening to evaluate if patient needs Home O2 and he does not -Follow up with PCP as an outpatient   Acute Large Saddle Pulmonary Embolus s/p EKOS -CTA of Chest showed Large central bilateral pulmonary embolus, which is nearly occlusive in the left main pulmonary artery extending to all 3 left-sided lobar arterial branches. Nonocclusive right main pulmonary artery embolus extending to the right upper lobar and right lower lobar pulmonary arteries. Heavy clot burden with marked right heart strain with RV/LV ratio of 1.5. -ECHO showed EF of of 50-55%, Grade 1DD, and Right Ventricle Cavity was dilated and Systolic Fxn was mildly to moderately reduced -Had catheter guided lysis with EKOS on 03/19/16 and IR removed Sheath on 03/20/17 -Fibrinogen Level went from 552 -> 451 -> 437 -> 488 -C/w Heparin gtt today with Pharmacy to manage -Transitioned to po Anticoagulation  with Xarelto; Case Management Consulted for price check on Xarelto vs. Apixaban and Xarelto costing $150.00 a month and patient ok with cost -PESI Score was at least Class 2 -Xarelto Starter Pack written and patient will need to  follow up with PCP for refills -Follow up with PCP and Hematology as an outpatient   Left Leg DVT -Duplex preliminary report showed that ther is acute DVT noted in the left posterior tibial, peroneal, popliteal, and mid to distal femoral veins. -C/w Anticoagulation as above.   Elevated Troponin -In the Setting of Acute Saddle PE -Stable and Flat at 0.04; Not indicative of ACS -Anticoagulated with Xarelto  Essential Hypertension -Holding Lisinopril-HCTZ currently and can continue at D/C but will need to speak to PCP about switching to an ARB -Patient likely has Bradykinin Cough from ACE-I and discussed with the patient and will like to change to ARB as an outpatient -C/w Dextroemthorphan 30 mg po BIDprn Cough -Continue to Monitor BP closely   Hx of Factor V Leiden -Discussed with Hematology Dr. Burr Medico and will pursue outpatient Hypercoaguable workup -Has Family Hx of it as well -Per my Conversation with Dr. Burr Medico patient will have a follow up within 1 month and will likely need life-long Anticoagulation   Normocytic Anemia -Patient's Hb/Hct has slowly been trending down and went from 14.1/43.7 -> 12.3/39.0 -> 12.1/37.9 -Had some blood from blowing his nose that was clotted -Continue to Monitor for S/Sx for bleeding as patient is Anticoagulated with Xarelto now -Repeat CBC as an outpatient   Hyperglycemia -Likely stress induced -Checked HbA1c and was 5.6 -Continue to Monitor Blood Sugares on BMP's  Hypomagnesemia -Improved. Mag Level went from 1.6 -> 2.0 -Continue to Monitor and Replete as Necessary -Repeat Mag Level as an outpatient  Hyperbilirubinemia -Trending Down and went from 2.4 -> 1.7 -> 1.2 -Continue to Monitor and repeat CMP as an outpatient   Back Spasm with likely Trigger Point in Muscle vs Lipoma -Outpatient Dry Needling and evaluation  -Given Diazepam 2.5 mg IV q2hprn Muscle Spasms while hospitalized -C/w Muscle Rub Cream as an outpatient    Hyperlipidemia -Checked Lipid Panel and showed Cholesterol of 130, HDL of 35, LDL of 69, TG of 129, and VLDL of 26 -C/w Atorvastatin 10 mg po Daily   Discharge Instructions  Discharge Instructions    Call MD for:  difficulty breathing, headache or visual disturbances   Complete by:  As directed    Call MD for:  extreme fatigue   Complete by:  As directed    Call MD for:  hives   Complete by:  As directed    Call MD for:  persistant dizziness or light-headedness   Complete by:  As directed    Call MD for:  persistant nausea and vomiting   Complete by:  As directed    Call MD for:  redness, tenderness, or signs of infection (pain, swelling, redness, odor or green/yellow discharge around incision site)   Complete by:  As directed    Call MD for:  severe uncontrolled pain   Complete by:  As directed    Call MD for:  temperature >100.4   Complete by:  As directed    Diet - low sodium heart healthy   Complete by:  As directed    Discharge instructions   Complete by:  As directed    Follow up with PCP and Oncology as an outpatient. Take all medications as prescribed. If symptoms change or worsen please return to the  ED for evaluation.   Increase activity slowly   Complete by:  As directed      Allergies as of 03/22/2017   No Known Allergies     Medication List    STOP taking these medications   aspirin EC 81 MG tablet     TAKE these medications   amoxicillin 500 MG capsule Commonly known as:  AMOXIL Take 2,000 mg by mouth See admin instructions. Take 4 capsules (2000 mg) by mouth one hour prior to dental appointment (last visit 01/30/17)   atorvastatin 10 MG tablet Commonly known as:  LIPITOR Take 10 mg by mouth daily.   fluocinonide cream 0.05 % Commonly known as:  LIDEX Apply 1 application topically daily as needed (itching/rash (Grover's disease)).   lisinopril-hydrochlorothiazide 20-12.5 MG tablet Commonly known as:  PRINZIDE,ZESTORETIC Take 1 tablet by mouth  daily.   Rivaroxaban 15 & 20 MG Tbpk Take as directed on package: Start with one 15mg  tablet by mouth twice a day with food. On Day 22, switch to one 20mg  tablet once a day with food.      Follow-up Information    Wenda Low, MD. Call.   Specialty:  Internal Medicine Why:  Follow up within 1 week Contact information: [address] [place] [address] [phone]          No Known Allergies  Consultations: PCCM Transfer Interventional Radiology Discussed Case with Dr. Burr Medico of Hematology  Procedures/Studies: Dg Chest 1 View  Result Date: 03/19/2017 CLINICAL DATA:  Pulmonary embolism post bilateral pulmonary angiogram. Abnormal respirations. History of hypertension. EXAM: CHEST 1 VIEW COMPARISON:  One 4 tooth FINDINGS: Infusion catheter is demonstrated in the pulmonary artery's bilaterally. Shallow inspiration. Cardiac enlargement. Pulmonary vascularity is normal. No airspace disease or consolidation in the lungs. No blunting of costophrenic angles. No pneumothorax. IMPRESSION: Cardiac enlargement. Shallow inspiration. No evidence of active pulmonary disease. Electronically Signed   By: Lucienne Capers M.D.   On: 03/19/2017 19:08   Dg Chest 2 View  Result Date: 03/18/2017 CLINICAL DATA:  Shortness of breath and chest pain. EXAM: CHEST  2 VIEW COMPARISON:  None. FINDINGS: The lungs are clear. Heart size is upper normal. No pneumothorax or pleural fluid. No bony abnormality. IMPRESSION: No acute disease. Electronically Signed   By: Inge Rise M.D.   On: 03/18/2017 13:58   Ct Angio Chest Pe W And/or Wo Contrast  Result Date: 03/18/2017 CLINICAL DATA:  Chest pain.  Shortness of breath. EXAM: CT ANGIOGRAPHY CHEST WITH CONTRAST TECHNIQUE: Multidetector CT imaging of the chest was performed using the standard protocol during bolus administration of intravenous contrast. Multiplanar CT image reconstructions and MIPs were obtained to evaluate the vascular anatomy.  CONTRAST:  35mL ISOVUE-370 IOPAMIDOL (ISOVUE-370) INJECTION 76% COMPARISON:  Chest radiograph 03/18/2017 FINDINGS: Cardiovascular: Large central bilateral pulmonary emboli. Nearly occlusive left main pulmonary artery embolus, extending to all 3 left-sided lobar arteries, and nonocclusive into the right main pulmonary artery, extending to the right upper lobe and right lower lobar pulmonary arteries, and several segmental branches. Enlargement of the right ventricle with RV/ LV ratio 1.5, evidence of significant right heart strain. Normal caliber of the aorta. Mediastinum/Nodes: No enlarged mediastinal, hilar, or axillary lymph nodes. Thyroid gland, trachea, and esophagus demonstrate no significant findings. Lungs/Pleura: Lungs are clear. No pleural effusion or pneumothorax. Upper Abdomen: No acute abnormality. Musculoskeletal: No chest wall abnormality. No acute or significant osseous findings. Review of the MIP images confirms the above findings. IMPRESSION: Large central bilateral  pulmonary embolus, which is nearly occlusive in the left main pulmonary artery extending to all 3 left-sided lobar arterial branches. Nonocclusive right main pulmonary artery embolus extending to the right upper lobar and right lower lobar pulmonary arteries. Heavy clot burden with marked right heart strain with RV/LV ratio of 1.5. Clear lungs. Critical Value/emergent results were called by telephone at the time of interpretation on 03/18/2017 at 4:09 pm to Dr. Pattricia Boss, who verbally acknowledged these results. Electronically Signed   By: Fidela Salisbury M.D.   On: 03/18/2017 16:10   Ir Angiogram Pulmonary Bilateral Selective  Result Date: 03/19/2017 INDICATION: 67 year old male with acute sub massive pulmonary embolism. He presents for catheter directed bilateral pulmonary artery thrombolysis. EXAM: IR INFUSION THROMBOL ARTERIAL INITIAL (MS); BILATERAL PULMONARY ARTERIOGRAPHY; IR ULTRASOUND GUIDANCE VASC ACCESS RIGHT;  ADDITIONAL ARTERIOGRAPHY COMPARISON:  CTA chest 03/18/2017 MEDICATIONS: None. ANESTHESIA/SEDATION: Versed 2 mg IV; Fentanyl 100 mcg IV Moderate Sedation Time:  30 minutes The patient was continuously monitored during the procedure by the interventional radiology nurse under my direct supervision. FLUOROSCOPY TIME:  Fluoroscopy Time: 10 minutes 36 seconds (77 mGy). COMPLICATIONS: None immediate. TECHNIQUE: Informed written consent was obtained from the patient after a thorough discussion of the procedural risks, benefits and alternatives. All questions were addressed. Maximal Sterile Barrier Technique was utilized including caps, mask, sterile gowns, sterile gloves, sterile drape, hand hygiene and skin antiseptic. A timeout was performed prior to the initiation of the procedure. The right common femoral vein was interrogated with ultrasound and found to be widely patent. An image was obtained and stored for the medical record. Local anesthesia was attained by infiltration with 1% lidocaine. A small dermatotomy was made. Under real-time sonographic guidance, the vessel was punctured with a 21 gauge micropuncture needle. Using standard technique, the initial micro needle was exchanged over a 0.018 micro wire for a transitional 4 [place] micro sheath. The micro sheath was then exchanged over a 0.035 wire for a 6 French vascular sheath. Using the exact same technique, a second 6 [place] vascular sheath was also advanced into the right common femoral artery adjacent to the first. Through the more medial sheath, a C2 cobra catheter was advanced over a Bentson wire into the right atrium, through the right ventricle and into the main pulmonary artery. A main pulmonary arteriogram was performed. The pulmonary arterial pressure was 52/21 (30) mmHg. A main pulmonary arteriogram was performed. The pulmonary arteries are grossly enlarged. Filling defects are present bilaterally. The C2 cobra catheter was then exchanged over a Rosen  wire for a a 100 cm Vert catheter. The vert catheter was successfully navigated into the right main pulmonary artery. A right pulmonary arteriogram was performed. This demonstrates nearly occlusive thrombus and demonstrates the anatomy. The catheter was then successfully navigated into the right lower lobe pulmonary artery. A Rosen wire was placed. A 12 cm EKOS infusion catheter was then advanced over the wire. Through the more lateral sheath, the C2 cobra catheter was reintroduced over a Bentson wire and again navigated into the main pulmonary artery. The C2 cobra catheter was then exchanged for a 100 cm for 2 catheter. The vert catheter was successfully navigated into the left main pulmonary artery. A pulmonary arteriogram was performed confirming nearly occlusive thrombus. The catheter was successfully navigated into the left lower lobe pulmonary artery. A Rosen wire was placed. The catheter was removed. An 18 cm EKOS infusion catheter was advanced over the wire and positioned. This sheaths were secured to the skin with 0  silk suture. Thrombolysis was initiated at 1 mg/hr tPA through each catheter for a total of 2 milligrams/hour. FINDINGS: Pulmonary arterial pressure 52/21 (30) mmHg IMPRESSION: 1. Pulmonary arterial hypertension with bilateral large volume pulmonary emboli. 2. Successful initiation of bilateral pulmonary artery thrombolysis. Signed, Criselda Peaches, MD [facility] Electronically Signed   By: Jacqulynn Cadet M.D.   On: 03/19/2017 16:37   Ir Angiogram Selective Each Additional Vessel  Result Date: 03/19/2017 INDICATION: 67 year old male with acute sub massive pulmonary embolism. He presents for catheter directed bilateral pulmonary artery thrombolysis. EXAM: ADDITIONAL ARTERIOGRAPHY COMPARISON:  CTA chest 03/18/2017 MEDICATIONS: None. ANESTHESIA/SEDATION: Versed 2 mg IV; Fentanyl 100 mcg IV Moderate Sedation Time: 30 minutes The  patient was continuously monitored during the procedure by the interventional radiology nurse under my direct supervision. FLUOROSCOPY TIME:  Fluoroscopy Time: 10 minutes 36 seconds (77 mGy). COMPLICATIONS: None immediate. TECHNIQUE: Informed written consent was obtained from the patient after a thorough discussion of the procedural risks, benefits and alternatives. All questions were addressed. Maximal Sterile Barrier Technique was utilized including caps, mask, sterile gowns, sterile gloves, sterile drape, hand hygiene and skin antiseptic. A timeout was performed prior to the initiation of the procedure. The right common femoral vein was interrogated with ultrasound and found to be widely patent. An image was obtained and stored for the medical record. Local anesthesia was attained by infiltration with 1% lidocaine. A small dermatotomy was made. Under real-time sonographic guidance, the vessel was punctured with a 21 gauge micropuncture needle. Using standard technique, the initial micro needle was exchanged over a 0.018 micro wire for a transitional 4 [place] micro sheath. The micro sheath was then exchanged over a 0.035 wire for a 6 French vascular sheath. Using the exact same technique, a second 6 [place] vascular sheath was also advanced into the right common femoral artery adjacent to the first. Through the more medial sheath, a C2 cobra catheter was advanced over a Bentson wire into the right atrium, through the right ventricle and into the main pulmonary artery. A main pulmonary arteriogram was performed. The pulmonary arterial pressure was 52/21 (30) mmHg. A main pulmonary arteriogram was performed. The pulmonary arteries are grossly enlarged. Filling defects are present bilaterally. The C2 cobra catheter was then exchanged over a Rosen wire for a a 100 cm Vert catheter. The vert catheter was successfully navigated into the right main pulmonary artery. A right pulmonary arteriogram was performed. This  demonstrates nearly occlusive thrombus and demonstrates the anatomy. The catheter was then successfully navigated into the right lower lobe pulmonary artery. A Rosen wire was placed. A 12 cm EKOS infusion catheter was then advanced over the wire. Through the more lateral sheath, the C2 cobra catheter was reintroduced over a Bentson wire and again navigated into the main pulmonary artery. The C2 cobra catheter was then exchanged for a 100 cm for 2 catheter. The vert catheter was successfully navigated into the left main pulmonary artery. A pulmonary arteriogram was performed confirming nearly occlusive thrombus. The catheter was successfully navigated into the left lower lobe pulmonary artery. A Rosen wire was placed. The catheter was removed. An 18 cm EKOS infusion catheter was advanced over the wire and positioned. This sheaths were secured to the skin with 0 silk suture. Thrombolysis was initiated at 1 mg/hr tPA through each catheter for a total of 2 milligrams/hour. FINDINGS: Pulmonary arterial pressure 52/21 (30) mmHg IMPRESSION: 1. Pulmonary arterial hypertension with bilateral large volume pulmonary emboli. 2. Successful  initiation of bilateral pulmonary artery thrombolysis. Electronically Signed   By: Jacqulynn Cadet M.D.   On: 03/19/2017 16:44   Ir Angiogram Selective Each Additional Vessel  Result Date: 03/19/2017 INDICATION: 67 year old male with acute sub massive pulmonary embolism. He presents for catheter directed bilateral pulmonary artery thrombolysis. EXAM: IR INFUSION THROMBOL ARTERIAL INITIAL (MS); BILATERAL PULMONARY ARTERIOGRAPHY; IR ULTRASOUND GUIDANCE VASC ACCESS RIGHT; ADDITIONAL ARTERIOGRAPHY COMPARISON:  CTA chest 03/18/2017 MEDICATIONS: None. ANESTHESIA/SEDATION: Versed 2 mg IV; Fentanyl 100 mcg IV Moderate Sedation Time:  30 minutes The patient was continuously monitored during the procedure by the interventional radiology nurse under my direct supervision. FLUOROSCOPY TIME:   Fluoroscopy Time: 10 minutes 36 seconds (77 mGy). COMPLICATIONS: None immediate. TECHNIQUE: Informed written consent was obtained from the patient after a thorough discussion of the procedural risks, benefits and alternatives. All questions were addressed. Maximal Sterile Barrier Technique was utilized including caps, mask, sterile gowns, sterile gloves, sterile drape, hand hygiene and skin antiseptic. A timeout was performed prior to the initiation of the procedure. The right common femoral vein was interrogated with ultrasound and found to be widely patent. An image was obtained and stored for the medical record. Local anesthesia was attained by infiltration with 1% lidocaine. A small dermatotomy was made. Under real-time sonographic guidance, the vessel was punctured with a 21 gauge micropuncture needle. Using standard technique, the initial micro needle was exchanged over a 0.018 micro wire for a transitional 4 [place] micro sheath. The micro sheath was then exchanged over a 0.035 wire for a 6 French vascular sheath. Using the exact same technique, a second 6 [place] vascular sheath was also advanced into the right common femoral artery adjacent to the first. Through the more medial sheath, a C2 cobra catheter was advanced over a Bentson wire into the right atrium, through the right ventricle and into the main pulmonary artery. A main pulmonary arteriogram was performed. The pulmonary arterial pressure was 52/21 (30) mmHg. A main pulmonary arteriogram was performed. The pulmonary arteries are grossly enlarged. Filling defects are present bilaterally. The C2 cobra catheter was then exchanged over a Rosen wire for a a 100 cm Vert catheter. The vert catheter was successfully navigated into the right main pulmonary artery. A right pulmonary arteriogram was performed. This demonstrates nearly occlusive thrombus and demonstrates the anatomy. The catheter was then successfully navigated into the right lower lobe  pulmonary artery. A Rosen wire was placed. A 12 cm EKOS infusion catheter was then advanced over the wire. Through the more lateral sheath, the C2 cobra catheter was reintroduced over a Bentson wire and again navigated into the main pulmonary artery. The C2 cobra catheter was then exchanged for a 100 cm for 2 catheter. The vert catheter was successfully navigated into the left main pulmonary artery. A pulmonary arteriogram was performed confirming nearly occlusive thrombus. The catheter was successfully navigated into the left lower lobe pulmonary artery. A Rosen wire was placed. The catheter was removed. An 18 cm EKOS infusion catheter was advanced over the wire and positioned. This sheaths were secured to the skin with 0 silk suture. Thrombolysis was initiated at 1 mg/hr tPA through each catheter for a total of 2 milligrams/hour. FINDINGS: Pulmonary arterial pressure 52/21 (30) mmHg IMPRESSION: 1. Pulmonary arterial hypertension with bilateral large volume pulmonary emboli. 2. Successful initiation of bilateral pulmonary artery thrombolysis. Signed, Criselda Peaches, MD [facility] Radiology Electronically Signed   By: Jacqulynn Cadet M.D.   On: 03/19/2017 16:37  Ir US Guide Vasc Access Right  Result Date: 03/19/2017 INDICATION: 67 year old male with acute sub massive pulmonary embolism. He presents for catheter directed bilateral pulmonary artery thrombolysis. EXAM: IR INFUSION THROMBOL ARTERIAL INITIAL (MS); BILATERAL PULMONARY ARTERIOGRAPHY; IR ULTRASOUND GUIDANCE VASC ACCESS RIGHT; ADDITIONAL ARTERIOGRAPHY COMPARISON:  CTA chest 03/18/2017 MEDICATIONS: None. ANESTHESIA/SEDATION: Versed 2 mg IV; Fentanyl 100 mcg IV Moderate Sedation Time:  30 minutes The patient was continuously monitored during the procedure by the interventional radiology nurse under my direct supervision. FLUOROSCOPY TIME:  Fluoroscopy Time: 10 minutes 36 seconds (77 mGy).  COMPLICATIONS: None immediate. TECHNIQUE: Informed written consent was obtained from the patient after a thorough discussion of the procedural risks, benefits and alternatives. All questions were addressed. Maximal Sterile Barrier Technique was utilized including caps, mask, sterile gowns, sterile gloves, sterile drape, hand hygiene and skin antiseptic. A timeout was performed prior to the initiation of the procedure. The right common femoral vein was interrogated with ultrasound and found to be widely patent. An image was obtained and stored for the medical record. Local anesthesia was attained by infiltration with 1% lidocaine. A small dermatotomy was made. Under real-time sonographic guidance, the vessel was punctured with a 21 gauge micropuncture needle. Using standard technique, the initial micro needle was exchanged over a 0.018 micro wire for a transitional 4 [place] micro sheath. The micro sheath was then exchanged over a 0.035 wire for a 6 French vascular sheath. Using the exact same technique, a second 6 [place] vascular sheath was also advanced into the right common femoral artery adjacent to the first. Through the more medial sheath, a C2 cobra catheter was advanced over a Bentson wire into the right atrium, through the right ventricle and into the main pulmonary artery. A main pulmonary arteriogram was performed. The pulmonary arterial pressure was 52/21 (30) mmHg. A main pulmonary arteriogram was performed. The pulmonary arteries are grossly enlarged. Filling defects are present bilaterally. The C2 cobra catheter was then exchanged over a Rosen wire for a a 100 cm Vert catheter. The vert catheter was successfully navigated into the right main pulmonary artery. A right pulmonary arteriogram was performed. This demonstrates nearly occlusive thrombus and demonstrates the anatomy. The catheter was then successfully navigated into the right lower lobe pulmonary artery. A Rosen wire was placed. A 12 cm EKOS  infusion catheter was then advanced over the wire. Through the more lateral sheath, the C2 cobra catheter was reintroduced over a Bentson wire and again navigated into the main pulmonary artery. The C2 cobra catheter was then exchanged for a 100 cm for 2 catheter. The vert catheter was successfully navigated into the left main pulmonary artery. A pulmonary arteriogram was performed confirming nearly occlusive thrombus. The catheter was successfully navigated into the left lower lobe pulmonary artery. A Rosen wire was placed. The catheter was removed. An 18 cm EKOS infusion catheter was advanced over the wire and positioned. This sheaths were secured to the skin with 0 silk suture. Thrombolysis was initiated at 1 mg/hr tPA through each catheter for a total of 2 milligrams/hour. FINDINGS: Pulmonary arterial pressure 52/21 (30) mmHg IMPRESSION: 1. Pulmonary arterial hypertension with bilateral large volume pulmonary emboli. 2. Successful initiation of bilateral pulmonary artery thrombolysis. Signed, Criselda Peaches, MD [facility] Radiology Electronically Signed   By: Jacqulynn Cadet M.D.   On: 03/19/2017 16:37   Ir Infusion Thrombol Arterial Initial (ms)  Result Date: 03/19/2017 INDICATION: 67 year old male with acute sub massive pulmonary embolism. He presents for  catheter directed bilateral pulmonary artery thrombolysis. EXAM: IR INFUSION THROMBOL ARTERIAL INITIAL (MS); BILATERAL PULMONARY ARTERIOGRAPHY; IR ULTRASOUND GUIDANCE VASC ACCESS RIGHT; ADDITIONAL ARTERIOGRAPHY COMPARISON:  CTA chest 03/18/2017 MEDICATIONS: None. ANESTHESIA/SEDATION: Versed 2 mg IV; Fentanyl 100 mcg IV Moderate Sedation Time:  30 minutes The patient was continuously monitored during the procedure by the interventional radiology nurse under my direct supervision. FLUOROSCOPY TIME:  Fluoroscopy Time: 10 minutes 36 seconds (77 mGy). COMPLICATIONS: None immediate. TECHNIQUE: Informed  written consent was obtained from the patient after a thorough discussion of the procedural risks, benefits and alternatives. All questions were addressed. Maximal Sterile Barrier Technique was utilized including caps, mask, sterile gowns, sterile gloves, sterile drape, hand hygiene and skin antiseptic. A timeout was performed prior to the initiation of the procedure. The right common femoral vein was interrogated with ultrasound and found to be widely patent. An image was obtained and stored for the medical record. Local anesthesia was attained by infiltration with 1% lidocaine. A small dermatotomy was made. Under real-time sonographic guidance, the vessel was punctured with a 21 gauge micropuncture needle. Using standard technique, the initial micro needle was exchanged over a 0.018 micro wire for a transitional 4 [place] micro sheath. The micro sheath was then exchanged over a 0.035 wire for a 6 French vascular sheath. Using the exact same technique, a second 6 [place] vascular sheath was also advanced into the right common femoral artery adjacent to the first. Through the more medial sheath, a C2 cobra catheter was advanced over a Bentson wire into the right atrium, through the right ventricle and into the main pulmonary artery. A main pulmonary arteriogram was performed. The pulmonary arterial pressure was 52/21 (30) mmHg. A main pulmonary arteriogram was performed. The pulmonary arteries are grossly enlarged. Filling defects are present bilaterally. The C2 cobra catheter was then exchanged over a Rosen wire for a a 100 cm Vert catheter. The vert catheter was successfully navigated into the right main pulmonary artery. A right pulmonary arteriogram was performed. This demonstrates nearly occlusive thrombus and demonstrates the anatomy. The catheter was then successfully navigated into the right lower lobe pulmonary artery. A Rosen wire was placed. A 12 cm EKOS infusion catheter was then advanced over the wire.  Through the more lateral sheath, the C2 cobra catheter was reintroduced over a Bentson wire and again navigated into the main pulmonary artery. The C2 cobra catheter was then exchanged for a 100 cm for 2 catheter. The vert catheter was successfully navigated into the left main pulmonary artery. A pulmonary arteriogram was performed confirming nearly occlusive thrombus. The catheter was successfully navigated into the left lower lobe pulmonary artery. A Rosen wire was placed. The catheter was removed. An 18 cm EKOS infusion catheter was advanced over the wire and positioned. This sheaths were secured to the skin with 0 silk suture. Thrombolysis was initiated at 1 mg/hr tPA through each catheter for a total of 2 milligrams/hour. FINDINGS: Pulmonary arterial pressure 52/21 (30) mmHg IMPRESSION: 1. Pulmonary arterial hypertension with bilateral large volume pulmonary emboli. 2. Successful initiation of bilateral pulmonary artery thrombolysis. Signed, Criselda Peaches, MD [facility] Radiology Electronically Signed   By: Jacqulynn Cadet M.D.   On: 03/19/2017 16:37   Ir Infusion Thrombol Arterial Initial (ms)  Result Date: 03/19/2017 INDICATION: 67 year old male with acute sub massive pulmonary embolism. He presents for catheter directed bilateral pulmonary artery thrombolysis. EXAM: IR INFUSION THROMBOL ARTERIAL INITIAL (MS); BILATERAL PULMONARY ARTERIOGRAPHY; IR ULTRASOUND GUIDANCE VASC ACCESS RIGHT;  ADDITIONAL ARTERIOGRAPHY COMPARISON:  CTA chest 03/18/2017 MEDICATIONS: None. ANESTHESIA/SEDATION: Versed 2 mg IV; Fentanyl 100 mcg IV Moderate Sedation Time:  30 minutes The patient was continuously monitored during the procedure by the interventional radiology nurse under my direct supervision. FLUOROSCOPY TIME:  Fluoroscopy Time: 10 minutes 36 seconds (77 mGy). COMPLICATIONS: None immediate. TECHNIQUE: Informed written consent was obtained from the patient after a  thorough discussion of the procedural risks, benefits and alternatives. All questions were addressed. Maximal Sterile Barrier Technique was utilized including caps, mask, sterile gowns, sterile gloves, sterile drape, hand hygiene and skin antiseptic. A timeout was performed prior to the initiation of the procedure. The right common femoral vein was interrogated with ultrasound and found to be widely patent. An image was obtained and stored for the medical record. Local anesthesia was attained by infiltration with 1% lidocaine. A small dermatotomy was made. Under real-time sonographic guidance, the vessel was punctured with a 21 gauge micropuncture needle. Using standard technique, the initial micro needle was exchanged over a 0.018 micro wire for a transitional 4 [place] micro sheath. The micro sheath was then exchanged over a 0.035 wire for a 6 French vascular sheath. Using the exact same technique, a second 6 [place] vascular sheath was also advanced into the right common femoral artery adjacent to the first. Through the more medial sheath, a C2 cobra catheter was advanced over a Bentson wire into the right atrium, through the right ventricle and into the main pulmonary artery. A main pulmonary arteriogram was performed. The pulmonary arterial pressure was 52/21 (30) mmHg. A main pulmonary arteriogram was performed. The pulmonary arteries are grossly enlarged. Filling defects are present bilaterally. The C2 cobra catheter was then exchanged over a Rosen wire for a a 100 cm Vert catheter. The vert catheter was successfully navigated into the right main pulmonary artery. A right pulmonary arteriogram was performed. This demonstrates nearly occlusive thrombus and demonstrates the anatomy. The catheter was then successfully navigated into the right lower lobe pulmonary artery. A Rosen wire was placed. A 12 cm EKOS infusion catheter was then advanced over the wire. Through the more lateral sheath, the C2 cobra catheter  was reintroduced over a Bentson wire and again navigated into the main pulmonary artery. The C2 cobra catheter was then exchanged for a 100 cm for 2 catheter. The vert catheter was successfully navigated into the left main pulmonary artery. A pulmonary arteriogram was performed confirming nearly occlusive thrombus. The catheter was successfully navigated into the left lower lobe pulmonary artery. A Rosen wire was placed. The catheter was removed. An 18 cm EKOS infusion catheter was advanced over the wire and positioned. This sheaths were secured to the skin with 0 silk suture. Thrombolysis was initiated at 1 mg/hr tPA through each catheter for a total of 2 milligrams/hour. FINDINGS: Pulmonary arterial pressure 52/21 (30) mmHg IMPRESSION: 1. Pulmonary arterial hypertension with bilateral large volume pulmonary emboli. 2. Successful initiation of bilateral pulmonary artery thrombolysis. Signed, Criselda Peaches, MD [facility] Radiology Electronically Signed   By: Jacqulynn Cadet M.D.   On: 03/19/2017 16:37   Ir Jacolyn Reedy F/u Elizabeth Sauer Art/ven Final Day (ms)  Result Date: 03/20/2017 INDICATION: 67 year old male with a history of sub massive pulmonary embolus. He underwent initiation of bilateral pulmonary artery thrombolysis yesterday. Re-evaluate pressures and removed catheters. EXAM: IR THROMB F/U EVAL ART/VEN FINAL DAY COMPARISON:  Pulmonary arteriography and catheter placement 03/19/2017 MEDICATIONS: None. ANESTHESIA/SEDATION: None FLUOROSCOPY TIME:  None COMPLICATIONS: None immediate. TECHNIQUE: Informed  written consent was obtained from the patient after a thorough discussion of the procedural risks, benefits and alternatives. All questions were addressed. A timeout was performed prior to the initiation of the procedure. The catheters were pulled back into the main pulmonary artery. Pulmonary arterial pressures were then obtained. The main pulmonary arterial  pressure is 36/12 (22) mmHg. The catheters were removed. The sheaths were pulled and hemostasis attained by manual pressure. FINDINGS: The main pulmonary arterial pressure is 36/12 (22) mmHg IMPRESSION: Significantly improved pulmonary arterial pressure with resolution of pulmonary arterial hypertension. Signed, Criselda Peaches, MD [facility] Radiology Electronically Signed   By: Jacqulynn Cadet M.D.   On: 03/20/2017 10:03    ECHOCARDIOGRAM Study Conclusions  - Left ventricle: The cavity size was normal. Wall thickness was increased in a pattern of mild LVH. Systolic function was normal. The estimated ejection fraction was in the range of 50% to 55%. Wall motion was normal; there were no regional wall motion abnormalities. Doppler parameters are consistent with abnormal left ventricular relaxation (grade 1 diastolic dysfunction). - Right ventricle: The cavity size was dilated. Systolic function was mildly to moderately reduced. - Pulmonary arteries: Systolic pressure was mildly to moderately increased. PA peak pressure: 42 mm Hg (S).  LE DUPLEX Showed that ther is acute DVT noted in the left posterior tibial, peroneal, popliteal, and mid to distal femoral veins.  Subjective: Seen and examined at bedside and was doing well. No CP or SOB. No nausea. Blew his nose yesterday and a small clot came out but none today. No other complaints or concerns and is ready to go home.  Discharge Exam: Vitals:   03/21/17 2008 03/22/17 0445  BP: 131/77 132/90  Pulse: 96 (!) 105  Resp: 17 18  Temp: 99.2 F (37.3 C) 98.4 F (36.9 C)  SpO2: 95% 98%   Vitals:   03/21/17 1135 03/21/17 1630 03/21/17 2008 03/22/17 0445  BP: 115/68 126/79 131/77 132/90  Pulse: 88 92 96 (!) 105  Resp:   17 18  Temp: 98.6 F (37 C) 99.3 F (37.4 C) 99.2 F (37.3 C) 98.4 F (36.9 C)  TempSrc: Oral Oral Oral Oral  SpO2: 94% 93% 95% 98%  Weight:     98 kg (216 lb 1.6 oz)  Height:       General: Pt is alert, awake, not in acute distress Cardiovascular: RRR, S1/S2 +, no rubs, no gallops Respiratory: CTA bilaterally, no wheezing, no rhonchi Abdominal: Soft, NT, ND, bowel sounds + Extremities: no edema, no cyanosis  The results of significant diagnostics from this hospitalization (including imaging, microbiology, ancillary and laboratory) are listed below for reference.    Microbiology: Recent Results (from the past 240 hour(s))  MRSA PCR Screening     Status: None   Collection Time: 03/19/17  3:39 AM  Result Value Ref Range Status   MRSA by PCR NEGATIVE NEGATIVE Final    Comment:        The GeneXpert MRSA Assay (FDA approved for NASAL specimens only), is one component of a comprehensive MRSA colonization surveillance program. It is not intended to diagnose MRSA infection nor to guide or monitor treatment for MRSA infections.     Labs: BNP (last 3 results) Recent Labs    03/18/17 1902  BNP 735.3*   Basic Metabolic Panel: Recent Labs  Lab 03/18/17 1331 03/20/17 0034 03/20/17 0718 03/21/17 0144 03/22/17 0436  NA 136 132*  --  136 137  K 4.6 4.0  --  4.0 3.6  CL 102 104  --  103 104  CO2 23 22  --  26 25  GLUCOSE 101* 93  --  131* 117*  BUN 18 16  --  11 7  CREATININE 1.23 1.03  --  1.00 0.92  CALCIUM 9.6 8.2*  --  8.2* 8.8*  MG  --   --  1.6* 2.2 2.0  PHOS  --   --  3.3 2.7 2.9   Liver Function Tests: Recent Labs  Lab 03/18/17 1331 03/21/17 0144 03/22/17 0436  AST 21 27 23   ALT 24 25 27   ALKPHOS 63 45 48  BILITOT 2.4* 1.7* 1.2  PROT 7.6 5.8* 6.1*  ALBUMIN 3.7 2.8* 3.0*   No results for input(s): LIPASE, AMYLASE in the last 168 hours. No results for input(s): AMMONIA in the last 168 hours. CBC: Recent Labs  Lab 03/18/17 1331  03/20/17 0034 03/20/17 0718 03/20/17 1541 03/21/17 0144 03/22/17 0436  WBC 8.1   < > 9.0 8.2 6.3 7.2 7.0  NEUTROABS 6.0  --   --   --   --   --  4.8  HGB 15.5   < >  13.0 12.8* 12.6* 12.3* 12.1*  HCT 47.1   < > 40.1 39.8 39.5 39.0 37.9*  MCV 93.8   < > 92.6 92.8 91.9 92.6 92.0  PLT 198   < > 153 137* 135* 153 199   < > = values in this interval not displayed.   Cardiac Enzymes: Recent Labs  Lab 03/18/17 1331 03/18/17 1902 03/19/17 0119 03/19/17 0617 03/20/17 0718  TROPONINI 0.07* 0.07* 0.04* 0.04* 0.04*   BNP: Invalid input(s): POCBNP CBG: No results for input(s): GLUCAP in the last 168 hours. D-Dimer No results for input(s): DDIMER in the last 72 hours. Hgb A1c Recent Labs    03/22/17 0436  HGBA1C 5.6   Lipid Profile Recent Labs    03/22/17 0436  CHOL 130  HDL 35*  LDLCALC 69  TRIG 129  CHOLHDL 3.7   Thyroid function studies No results for input(s): TSH, T4TOTAL, T3FREE, THYROIDAB in the last 72 hours.  Invalid input(s): FREET3 Anemia work up No results for input(s): VITAMINB12, FOLATE, FERRITIN, TIBC, IRON, RETICCTPCT in the last 72 hours. Urinalysis No results found for: COLORURINE, APPEARANCEUR, [place], [place], [place], [place], [place], [place], PROTEINUR, UROBILINOGEN, NITRITE, LEUKOCYTESUR Sepsis Labs Invalid input(s): PROCALCITONIN,  WBC,  LACTICIDVEN Microbiology Recent Results (from the past 240 hour(s))  MRSA PCR Screening     Status: None   Collection Time: 03/19/17  3:39 AM  Result Value Ref Range Status   MRSA by PCR NEGATIVE NEGATIVE Final    Comment:        The GeneXpert MRSA Assay (FDA approved for NASAL specimens only), is one component of a comprehensive MRSA colonization surveillance program. It is not intended to diagnose MRSA infection nor to guide or monitor treatment for MRSA infections.    Time coordinating discharge: 35 minutes  SIGNED:  Kerney Elbe, DO [facility] 03/22/2017, 11:13 AM Pager [phone]  If 7PM-7AM, please contact night-coverage [email or web address] Password [email or web address]

## 2017-03-22 NOTE — Discharge Instructions (Signed)
Information on my medicine - XARELTO (rivaroxaban)  This medication education was reviewed with me or my healthcare representative as part of my discharge preparation.  The pharmacist that spoke with me during my hospital stay was:  Horton Chin, PharmD, BCPS WHY WAS [place]? Xarelto was prescribed to treat blood clots that may have been found in the veins of your legs (deep vein thrombosis) or in your lungs (pulmonary embolism) and to reduce the risk of them occurring again.  What do you need to know about Xarelto? The starting dose is one 15 mg tablet taken TWICE daily with food for the FIRST 21 DAYS then on 04/12/17  the dose is changed to one 20 mg tablet taken ONCE A DAY with your evening meal.  DO NOT stop taking Xarelto without talking to the health care provider who prescribed the medication.  Refill your prescription for 20 mg tablets before you run out.  After discharge, you should have regular check-up appointments with your healthcare provider that is prescribing your Xarelto.  In the future your dose may need to be changed if your kidney function changes by a significant amount.  What do you do if you miss a dose? If you are taking Xarelto TWICE DAILY and you miss a dose, take it as soon as you remember. You may take two 15 mg tablets (total 30 mg) at the same time then resume your regularly scheduled 15 mg twice daily the next day.  If you are taking Xarelto ONCE DAILY and you miss a dose, take it as soon as you remember on the same day then continue your regularly scheduled once daily regimen the next day. Do not take two doses of Xarelto at the same time.   Important Safety Information Xarelto is a blood thinner medicine that can cause bleeding. You should call your healthcare provider right away if you experience any of the following: ? Bleeding from an injury or your nose that does not stop. ? Unusual colored urine (red or dark brown) or unusual  colored stools (red or black). ? Unusual bruising for unknown reasons. ? A serious fall or if you hit your head (even if there is no bleeding).  Some medicines may interact with Xarelto and might increase your risk of bleeding while on Xarelto. To help avoid this, consult your healthcare provider or pharmacist prior to using any new prescription or non-prescription medications, including herbals, vitamins, non-steroidal anti-inflammatory drugs (NSAIDs) and supplements.  This website has more information on Xarelto: [email or web address]

## 2017-03-22 NOTE — Progress Notes (Signed)
ANTICOAGULATION CONSULT NOTE - Follow-Up Consult  Pharmacy Consult for heparin Indication: pulmonary embolus  No Known Allergies  Patient Measurements: Height: 5\' 10"  (177.8 cm) Weight: 216 lb 1.6 oz (98 kg) IBW/kg (Calculated) : 73 Heparin Dosing Weight: 92kg  Vital Signs: Temp: 98.4 F (36.9 C) (01/08 0445) Temp Source: Oral (01/08 0445) BP: 132/90 (01/08 0445) Pulse Rate: 105 (01/08 0445)  Labs: Recent Labs    03/20/17 0034 03/20/17 0718 03/20/17 1541  03/21/17 0144 03/21/17 0816 03/22/17 0436  HGB 13.0 12.8* 12.6*  --  12.3*  --  12.1*  HCT 40.1 39.8 39.5  --  39.0  --  37.9*  PLT 153 137* 135*  --  153  --  199  HEPARINUNFRC  --  0.45 0.30   < > 0.35 0.44 0.28*  CREATININE 1.03  --   --   --  1.00  --  0.92  TROPONINI  --  0.04*  --   --   --   --   --    < > = values in this interval not displayed.    Estimated Creatinine Clearance: 95.2 mL/min (by C-G formula based on SCr of 0.92 mg/dL).   Medical History: Past Medical History:  Diagnosis Date  . Hypertension     Medications:  Infusions:  . sodium chloride Stopped (03/20/17 0930)  . sodium chloride Stopped (03/20/17 0930)  . sodium chloride Stopped (03/20/17 0930)  . sodium chloride 10 mL/hr at 03/20/17 0930  . sodium chloride    . heparin 1,500 Units/hr (03/22/17 2248)    Assessment: 67 yo M presented to the ED on 1/4 with SOB. Found to have large bilateral PE with right heart strain. Started IV heparin and received catheter directed thrombolytic (EKOS) 1/5 > 1/6. Pt continues on heparin at 1500 units/hr with slightly sub - therapeutic level this AM.  Follow-up plans for oral anticoagulation.  He is not on anticoagulation PTA but has been on xarelto in the past for history of DVT.   Goal of Therapy:  Heparin level 0.3-0.7 units/ml Monitor platelets by anticoagulation protocol: Yes   Plan:  Increase Heparin to 1650 units/hr Defer confirmation heparin level at this time in anticipation of  switching to PO anticoagulation this morning (1/8) Daily heparin level and CBC   Dylyn Mclaren, Pharm.D., BCPS Clinical Pharmacist Pager: [phone] Clinical phone for 65/10/2017 from 8:30-4:00 is [phone]. After 4pm, please call Main Rx [phone]) for assistance. 03/22/2017 8:53 AM

## 2017-04-04 ENCOUNTER — Telehealth: Payer: Self-pay | Admitting: Medical Oncology

## 2017-04-04 NOTE — Telephone Encounter (Signed)
Pt has not heard about appt with Dr Burr Medico. Pt has factor V Leiden mutation. Hospitalist and Dr Deforest Hoyles made referrals.

## 2017-04-04 NOTE — Telephone Encounter (Signed)
I will schedule him to see my NP and me this Thursday.   Truitt Merle MD

## 2017-04-05 ENCOUNTER — Telehealth: Payer: Self-pay | Admitting: Hematology

## 2017-04-05 NOTE — Telephone Encounter (Signed)
Spoke with patient re hosp f/u appointment for 1/24 @ 2pm. Shared visit LB/YF. Date/time per staff message from [place]. Patient phone number updated in Epic - numbers transposed.

## 2017-04-07 ENCOUNTER — Inpatient Hospital Stay: Payer: BLUE CROSS/BLUE SHIELD | Attending: Nurse Practitioner | Admitting: Nurse Practitioner

## 2017-04-07 ENCOUNTER — Encounter: Payer: Self-pay | Admitting: Nurse Practitioner

## 2017-04-07 VITALS — BP 121/69 | HR 84 | Temp 99.3°F | Resp 19 | Ht 70.0 in | Wt 217.6 lb

## 2017-04-07 DIAGNOSIS — D6851 Activated protein C resistance: Secondary | ICD-10-CM | POA: Diagnosis not present

## 2017-04-07 DIAGNOSIS — I82432 Acute embolism and thrombosis of left popliteal vein: Secondary | ICD-10-CM | POA: Diagnosis not present

## 2017-04-07 DIAGNOSIS — I2692 Saddle embolus of pulmonary artery without acute cor pulmonale: Secondary | ICD-10-CM | POA: Insufficient documentation

## 2017-04-07 DIAGNOSIS — I82442 Acute embolism and thrombosis of left tibial vein: Secondary | ICD-10-CM

## 2017-04-07 DIAGNOSIS — I82492 Acute embolism and thrombosis of other specified deep vein of left lower extremity: Secondary | ICD-10-CM | POA: Diagnosis not present

## 2017-04-07 DIAGNOSIS — Z7901 Long term (current) use of anticoagulants: Secondary | ICD-10-CM

## 2017-04-07 DIAGNOSIS — I82412 Acute embolism and thrombosis of left femoral vein: Secondary | ICD-10-CM | POA: Diagnosis not present

## 2017-04-07 NOTE — Progress Notes (Addendum)
[place]  [phone] Fax:([phone]) [place] Note   Patient Care Team: Wenda Low, MD as PCP - General (Internal Medicine) 04/07/2017  CHIEF COMPLAINTS/PURPOSE OF CONSULTATION:  Pulmonary embolism   REFERRING PHYSICIAN: Dr. Lysle Rubens   HISTORY OF PRESENTING ILLNESS:  Jose Bowers 67 y.o. male is here because of DVT and pulmonary embolism s/p hospitalization for coagulation. Early December he noted stiffness in the left knee, he went to orthopedist and received cortisone injection. He was not immobile, no injury. Stiffness improved. Course was insignificant until later that month he developed progressive dyspnea, fatigue, and decreased appetite over 1 week. He denied leg swelling or pain. Has chronic dry cough at baseline, on lisinopril previously. He went to his PCP who ordered chest imaging which was notable for large central bilateral pulmonary embolus which was nearly occlusive in the left main pulmonary artery extending to all 3 left-sided lobar arterial branches. There was nonocclusive right main pulmonary artery embolus extending to the right upper lobar and right lower lobar pulmonary arteries. There was large clot burden and marked right heart strain. Bilateral lower extremity doppler found acute DVT noted in the left posterior tibial, peroneal, popliteal, and mid to distal femoral veins. He was hospitalized for evaluation and management. Underwent catheter assisted thrombolysis and heparinization, ultimately transitioned to Xarelto and discharged home with outpatient hematology follow up.   He has history of DVT after shoulder surgery 5 years ago when he did not receive post op anticoagulation. He was treated with Xarelto for a few months then discontinued. He has past medical history significant for HTN, HL, and factor V Leiden mutation. No known autoimmune disorder, liver, or kidney comorbidities. Had colonoscopy at age 65 and 21, the  results are not known to me. His father, only son, brother, and 1 grandchild are positive for factor V Leiden. He is retired Dietitian.  Since hospital discharge dyspnea has improved but not resolved. He continues to have dry cough, lisinopril was changed to losartan-HCTZ. He notes low grade fevers, present during hospitalization, without night sweats. Has intentionally lost weight over few years with healthy diet and exercise. Had blood on blowing nose during hospitalization but no overt epistaxis and none since discharge. Denies nausea, vomiting, constipation, diarrhea. He is beginning to advance his activity as instructed.   MEDICAL HISTORY:  Past Medical History:  Diagnosis Date  . Factor V Leiden ([place])   . Hypertension     SURGICAL HISTORY: Past Surgical History:  Procedure Laterality Date  . HERNIA REPAIR    . IR ANGIOGRAM PULMONARY BILATERAL SELECTIVE  03/19/2017  . IR ANGIOGRAM SELECTIVE EACH ADDITIONAL VESSEL  03/19/2017  . IR ANGIOGRAM SELECTIVE EACH ADDITIONAL VESSEL  03/19/2017  . IR INFUSION THROMBOL ARTERIAL INITIAL (MS)  03/19/2017  . IR INFUSION THROMBOL ARTERIAL INITIAL (MS)  03/19/2017  . IR THROMB F/U EVAL ART/VEN FINAL DAY (MS)  03/20/2017  . IR US GUIDE VASC ACCESS RIGHT  03/19/2017  . JOINT REPLACEMENT    . ROTATOR CUFF REPAIR      SOCIAL HISTORY: Social History   Socioeconomic History  . Marital status: Married    Spouse name: Not on file  . Number of children: Not on file  . Years of education: Not on file  . Highest education level: Not on file  Social Needs  . Financial resource strain: Not on file  . Food insecurity - worry: Not on file  . Food insecurity - inability: Not on file  .  Transportation needs - medical: Not on file  . Transportation needs - non-medical: Not on file  Occupational History  . Occupation: retired     Comment: Dietitian   Tobacco Use  . Smoking status: Never Smoker  . Smokeless tobacco: Never Used  Substance and Sexual  Activity  . Alcohol use: Yes    Comment: "few times a week"  . Drug use: No  . Sexual activity: Not on file  Other Topics Concern  . Not on file  Social History Narrative  . Not on file    FAMILY HISTORY: Family History  Problem Relation Age of Onset  . Clotting disorder Father   . Clotting disorder Brother   . Clotting disorder Son   . Clotting disorder Grandchild     ALLERGIES:  has No Known Allergies.  MEDICATIONS:  Current Outpatient Medications  Medication Sig Dispense Refill  . amoxicillin (AMOXIL) 500 MG capsule Take 2,000 mg by mouth See admin instructions. Take 4 capsules (2000 mg) by mouth one hour prior to dental appointment (last visit 01/30/17)    . atorvastatin (LIPITOR) 10 MG tablet Take 10 mg by mouth daily.    . fluocinonide cream (LIDEX) 4.97 % Apply 1 application topically daily as needed (itching/rash (Grover's disease)).    [place] losartan-hydrochlorothiazide (HYZAAR) 50-12.5 MG tablet Take 1 tablet by mouth daily.  11  . Rivaroxaban 15 & 20 MG TBPK Take as directed on package: Start with one 15mg  tablet by mouth twice a day with food. On Day 22, switch to one 20mg  tablet once a day with food. 51 each 0   No current facility-administered medications for this visit.     REVIEW OF SYSTEMS:   Constitutional: Denies fevers, chills or abnormal night sweats (+) 3 week history low grade temp <100 (+) intentional weight loss with healthy diet and exercise  Eyes: Denies blurriness of vision, double vision or watery eyes Ears, nose, mouth, throat, and face: Denies epistaxis, mucositis or sore throat Respiratory: Denies wheezes (+) chronic dry cough (+) dyspnea, improving  Cardiovascular: Denies palpitation, chest discomfort or lower extremity swelling Gastrointestinal:  Denies nausea, vomiting, constipation, diarrhea, heartburn or change in bowel habits Skin: Denies abnormal skin rashes Lymphatics: Denies new lymphadenopathy or easy bruising Vascular: (+) bruising  from vascular access while hospitalized (+) blood in nasal discharge x2 during hospitalization, none since discharge.  Neurological:Denies numbness, tingling or new weaknesses Behavioral/Psych: Mood is stable, no new changes  All other systems were reviewed with the patient and are negative.  PHYSICAL EXAMINATION: ECOG PERFORMANCE STATUS: 1 - Symptomatic but completely ambulatory  Vitals:   04/07/17 1411  BP: 121/69  Pulse: 84  Resp: 19  Temp: 99.3 F (37.4 C)  SpO2: 96%   Filed Weights   04/07/17 1411  Weight: 217 lb 9.6 oz (98.7 kg)    GENERAL:alert, no distress and comfortable SKIN: skin color, texture, turgor are normal, no rashes or significant lesions EYES: normal, conjunctiva are pink and non-injected, sclera clear OROPHARYNX:no exudate, no erythema and lips, buccal mucosa, and tongue normal  NECK: supple, thyroid normal size, non-tender, without nodularity LYMPH:  no palpable cervical, supraclavicular, axillary, or inguinal lymphadenopathy LUNGS: clear to auscultation bilaterally with normal breathing effort HEART: regular rate & rhythm and no murmurs and no lower extremity edema ABDOMEN:abdomen soft, non-tender and normal bowel sounds. No palpable hepatomegaly Musculoskeletal:no cyanosis of digits and no clubbing  PSYCH: alert & oriented x 3 with fluent speech NEURO: no focal motor/sensory deficits  LABORATORY DATA:  I have reviewed the data as listed CBC Latest Ref Rng & Units 03/22/2017 03/21/2017 03/20/2017  WBC 4.0 - 10.5 K/uL 7.0 7.2 6.3  Hemoglobin 13.0 - 17.0 g/dL 12.1(L) 12.3(L) 12.6(L)  Hematocrit 39.0 - 52.0 % 37.9(L) 39.0 39.5  Platelets 150 - 400 K/uL 199 153 135(L)   CMP Latest Ref Rng & Units 03/22/2017 03/21/2017 03/20/2017  Glucose 65 - 99 mg/dL 117(H) 131(H) 93  BUN 6 - 20 mg/dL 7 11 16   Creatinine 0.61 - 1.24 mg/dL 0.92 1.00 1.03  Sodium 135 - 145 mmol/L 137 136 132(L)  Potassium 3.5 - 5.1 mmol/L 3.6 4.0 4.0  Chloride 101 - 111 mmol/L 104 103 104  CO2  22 - 32 mmol/L 25 26 22   Calcium 8.9 - 10.3 mg/dL 8.8(L) 8.2(L) 8.2(L)  Total Protein 6.5 - 8.1 g/dL 6.1(L) 5.8(L) -  Total Bilirubin 0.3 - 1.2 mg/dL 1.2 1.7(H) -  Alkaline Phos 38 - 126 U/L 48 45 -  AST 15 - 41 U/L 23 27 -  ALT 17 - 63 U/L 27 25 -    RADIOGRAPHIC STUDIES: I have personally reviewed the radiological images as listed and agreed with the findings in the report. Dg Chest 1 View  Result Date: 03/19/2017 CLINICAL DATA:  Pulmonary embolism post bilateral pulmonary angiogram. Abnormal respirations. History of hypertension. EXAM: CHEST 1 VIEW COMPARISON:  One 4 tooth FINDINGS: Infusion catheter is demonstrated in the pulmonary artery's bilaterally. Shallow inspiration. Cardiac enlargement. Pulmonary vascularity is normal. No airspace disease or consolidation in the lungs. No blunting of costophrenic angles. No pneumothorax. IMPRESSION: Cardiac enlargement. Shallow inspiration. No evidence of active pulmonary disease. Electronically Signed   By: Lucienne Capers M.D.   On: 03/19/2017 19:08   Dg Chest 2 View  Result Date: 03/18/2017 CLINICAL DATA:  Shortness of breath and chest pain. EXAM: CHEST  2 VIEW COMPARISON:  None. FINDINGS: The lungs are clear. Heart size is upper normal. No pneumothorax or pleural fluid. No bony abnormality. IMPRESSION: No acute disease. Electronically Signed   By: Inge Rise M.D.   On: 03/18/2017 13:58   Ct Angio Chest Pe W And/or Wo Contrast  Result Date: 03/18/2017 CLINICAL DATA:  Chest pain.  Shortness of breath. EXAM: CT ANGIOGRAPHY CHEST WITH CONTRAST TECHNIQUE: Multidetector CT imaging of the chest was performed using the standard protocol during bolus administration of intravenous contrast. Multiplanar CT image reconstructions and MIPs were obtained to evaluate the vascular anatomy. CONTRAST:  6mL ISOVUE-370 IOPAMIDOL (ISOVUE-370) INJECTION 76% COMPARISON:  Chest radiograph 03/18/2017 FINDINGS: Cardiovascular: Large central bilateral pulmonary emboli.  Nearly occlusive left main pulmonary artery embolus, extending to all 3 left-sided lobar arteries, and nonocclusive into the right main pulmonary artery, extending to the right upper lobe and right lower lobar pulmonary arteries, and several segmental branches. Enlargement of the right ventricle with RV/ LV ratio 1.5, evidence of significant right heart strain. Normal caliber of the aorta. Mediastinum/Nodes: No enlarged mediastinal, hilar, or axillary lymph nodes. Thyroid gland, trachea, and esophagus demonstrate no significant findings. Lungs/Pleura: Lungs are clear. No pleural effusion or pneumothorax. Upper Abdomen: No acute abnormality. Musculoskeletal: No chest wall abnormality. No acute or significant osseous findings. Review of the MIP images confirms the above findings. IMPRESSION: Large central bilateral pulmonary embolus, which is nearly occlusive in the left main pulmonary artery extending to all 3 left-sided lobar arterial branches. Nonocclusive right main pulmonary artery embolus extending to the right upper lobar and right lower lobar pulmonary arteries. Heavy clot burden with marked right heart  strain with RV/LV ratio of 1.5. Clear lungs. Critical Value/emergent results were called by telephone at the time of interpretation on 03/18/2017 at 4:09 pm to Dr. Pattricia Boss, who verbally acknowledged these results. Electronically Signed   By: Fidela Salisbury M.D.   On: 03/18/2017 16:10   Ir Angiogram Pulmonary Bilateral Selective  Result Date: 03/19/2017 INDICATION: 67 year old male with acute sub massive pulmonary embolism. He presents for catheter directed bilateral pulmonary artery thrombolysis. EXAM: IR INFUSION THROMBOL ARTERIAL INITIAL (MS); BILATERAL PULMONARY ARTERIOGRAPHY; IR ULTRASOUND GUIDANCE VASC ACCESS RIGHT; ADDITIONAL ARTERIOGRAPHY COMPARISON:  CTA chest 03/18/2017 MEDICATIONS: None. ANESTHESIA/SEDATION: Versed 2 mg IV; Fentanyl 100 mcg IV Moderate Sedation Time:  30 minutes The patient  was continuously monitored during the procedure by the interventional radiology nurse under my direct supervision. FLUOROSCOPY TIME:  Fluoroscopy Time: 10 minutes 36 seconds (77 mGy). COMPLICATIONS: None immediate. TECHNIQUE: Informed written consent was obtained from the patient after a thorough discussion of the procedural risks, benefits and alternatives. All questions were addressed. Maximal Sterile Barrier Technique was utilized including caps, mask, sterile gowns, sterile gloves, sterile drape, hand hygiene and skin antiseptic. A timeout was performed prior to the initiation of the procedure. The right common femoral vein was interrogated with ultrasound and found to be widely patent. An image was obtained and stored for the medical record. Local anesthesia was attained by infiltration with 1% lidocaine. A small dermatotomy was made. Under real-time sonographic guidance, the vessel was punctured with a 21 gauge micropuncture needle. Using standard technique, the initial micro needle was exchanged over a 0.018 micro wire for a transitional 4 [place] micro sheath. The micro sheath was then exchanged over a 0.035 wire for a 6 French vascular sheath. Using the exact same technique, a second 6 [place] vascular sheath was also advanced into the right common femoral artery adjacent to the first. Through the more medial sheath, a C2 cobra catheter was advanced over a Bentson wire into the right atrium, through the right ventricle and into the main pulmonary artery. A main pulmonary arteriogram was performed. The pulmonary arterial pressure was 52/21 (30) mmHg. A main pulmonary arteriogram was performed. The pulmonary arteries are grossly enlarged. Filling defects are present bilaterally. The C2 cobra catheter was then exchanged over a Rosen wire for a a 100 cm Vert catheter. The vert catheter was successfully navigated into the right main pulmonary artery. A right pulmonary arteriogram was performed. This demonstrates  nearly occlusive thrombus and demonstrates the anatomy. The catheter was then successfully navigated into the right lower lobe pulmonary artery. A Rosen wire was placed. A 12 cm EKOS infusion catheter was then advanced over the wire. Through the more lateral sheath, the C2 cobra catheter was reintroduced over a Bentson wire and again navigated into the main pulmonary artery. The C2 cobra catheter was then exchanged for a 100 cm for 2 catheter. The vert catheter was successfully navigated into the left main pulmonary artery. A pulmonary arteriogram was performed confirming nearly occlusive thrombus. The catheter was successfully navigated into the left lower lobe pulmonary artery. A Rosen wire was placed. The catheter was removed. An 18 cm EKOS infusion catheter was advanced over the wire and positioned. This sheaths were secured to the skin with 0 silk suture. Thrombolysis was initiated at 1 mg/hr tPA through each catheter for a total of 2 milligrams/hour. FINDINGS: Pulmonary arterial pressure 52/21 (30) mmHg IMPRESSION: 1. Pulmonary arterial hypertension with bilateral large volume pulmonary emboli. 2. Successful initiation of bilateral pulmonary artery thrombolysis. Signed,  Criselda Peaches, MD [facility] Radiology Electronically Signed   By: Jacqulynn Cadet M.D.   On: 03/19/2017 16:37   Ir Angiogram Selective Each Additional Vessel  Result Date: 03/19/2017 INDICATION: 67 year old male with acute sub massive pulmonary embolism. He presents for catheter directed bilateral pulmonary artery thrombolysis. EXAM: ADDITIONAL ARTERIOGRAPHY COMPARISON:  CTA chest 03/18/2017 MEDICATIONS: None. ANESTHESIA/SEDATION: Versed 2 mg IV; Fentanyl 100 mcg IV Moderate Sedation Time: 30 minutes The patient was continuously monitored during the procedure by the interventional radiology nurse under my direct supervision. FLUOROSCOPY TIME:  Fluoroscopy Time: 10 minutes 36 seconds  (77 mGy). COMPLICATIONS: None immediate. TECHNIQUE: Informed written consent was obtained from the patient after a thorough discussion of the procedural risks, benefits and alternatives. All questions were addressed. Maximal Sterile Barrier Technique was utilized including caps, mask, sterile gowns, sterile gloves, sterile drape, hand hygiene and skin antiseptic. A timeout was performed prior to the initiation of the procedure. The right common femoral vein was interrogated with ultrasound and found to be widely patent. An image was obtained and stored for the medical record. Local anesthesia was attained by infiltration with 1% lidocaine. A small dermatotomy was made. Under real-time sonographic guidance, the vessel was punctured with a 21 gauge micropuncture needle. Using standard technique, the initial micro needle was exchanged over a 0.018 micro wire for a transitional 4 [place] micro sheath. The micro sheath was then exchanged over a 0.035 wire for a 6 French vascular sheath. Using the exact same technique, a second 6 [place] vascular sheath was also advanced into the right common femoral artery adjacent to the first. Through the more medial sheath, a C2 cobra catheter was advanced over a Bentson wire into the right atrium, through the right ventricle and into the main pulmonary artery. A main pulmonary arteriogram was performed. The pulmonary arterial pressure was 52/21 (30) mmHg. A main pulmonary arteriogram was performed. The pulmonary arteries are grossly enlarged. Filling defects are present bilaterally. The C2 cobra catheter was then exchanged over a Rosen wire for a a 100 cm Vert catheter. The vert catheter was successfully navigated into the right main pulmonary artery. A right pulmonary arteriogram was performed. This demonstrates nearly occlusive thrombus and demonstrates the anatomy. The catheter was then successfully navigated into the right lower lobe pulmonary artery. A Rosen wire was placed. A 12  cm EKOS infusion catheter was then advanced over the wire. Through the more lateral sheath, the C2 cobra catheter was reintroduced over a Bentson wire and again navigated into the main pulmonary artery. The C2 cobra catheter was then exchanged for a 100 cm for 2 catheter. The vert catheter was successfully navigated into the left main pulmonary artery. A pulmonary arteriogram was performed confirming nearly occlusive thrombus. The catheter was successfully navigated into the left lower lobe pulmonary artery. A Rosen wire was placed. The catheter was removed. An 18 cm EKOS infusion catheter was advanced over the wire and positioned. This sheaths were secured to the skin with 0 silk suture. Thrombolysis was initiated at 1 mg/hr tPA through each catheter for a total of 2 milligrams/hour. FINDINGS: Pulmonary arterial pressure 52/21 (30) mmHg IMPRESSION: 1. Pulmonary arterial hypertension with bilateral large volume pulmonary emboli. 2. Successful initiation of bilateral pulmonary artery thrombolysis. Electronically Signed   By: Jacqulynn Cadet M.D.   On: 03/19/2017 16:44   Ir Angiogram Selective Each Additional Vessel  Result Date: 03/19/2017 INDICATION: 67 year old male with acute sub massive pulmonary embolism. He presents for catheter  directed bilateral pulmonary artery thrombolysis. EXAM: IR INFUSION THROMBOL ARTERIAL INITIAL (MS); BILATERAL PULMONARY ARTERIOGRAPHY; IR ULTRASOUND GUIDANCE VASC ACCESS RIGHT; ADDITIONAL ARTERIOGRAPHY COMPARISON:  CTA chest 03/18/2017 MEDICATIONS: None. ANESTHESIA/SEDATION: Versed 2 mg IV; Fentanyl 100 mcg IV Moderate Sedation Time:  30 minutes The patient was continuously monitored during the procedure by the interventional radiology nurse under my direct supervision. FLUOROSCOPY TIME:  Fluoroscopy Time: 10 minutes 36 seconds (77 mGy). COMPLICATIONS: None immediate. TECHNIQUE: Informed written consent was obtained from the patient after a thorough discussion of the procedural  risks, benefits and alternatives. All questions were addressed. Maximal Sterile Barrier Technique was utilized including caps, mask, sterile gowns, sterile gloves, sterile drape, hand hygiene and skin antiseptic. A timeout was performed prior to the initiation of the procedure. The right common femoral vein was interrogated with ultrasound and found to be widely patent. An image was obtained and stored for the medical record. Local anesthesia was attained by infiltration with 1% lidocaine. A small dermatotomy was made. Under real-time sonographic guidance, the vessel was punctured with a 21 gauge micropuncture needle. Using standard technique, the initial micro needle was exchanged over a 0.018 micro wire for a transitional 4 [place] micro sheath. The micro sheath was then exchanged over a 0.035 wire for a 6 French vascular sheath. Using the exact same technique, a second 6 [place] vascular sheath was also advanced into the right common femoral artery adjacent to the first. Through the more medial sheath, a C2 cobra catheter was advanced over a Bentson wire into the right atrium, through the right ventricle and into the main pulmonary artery. A main pulmonary arteriogram was performed. The pulmonary arterial pressure was 52/21 (30) mmHg. A main pulmonary arteriogram was performed. The pulmonary arteries are grossly enlarged. Filling defects are present bilaterally. The C2 cobra catheter was then exchanged over a Rosen wire for a a 100 cm Vert catheter. The vert catheter was successfully navigated into the right main pulmonary artery. A right pulmonary arteriogram was performed. This demonstrates nearly occlusive thrombus and demonstrates the anatomy. The catheter was then successfully navigated into the right lower lobe pulmonary artery. A Rosen wire was placed. A 12 cm EKOS infusion catheter was then advanced over the wire. Through the more lateral sheath, the C2 cobra catheter was reintroduced over a Bentson wire  and again navigated into the main pulmonary artery. The C2 cobra catheter was then exchanged for a 100 cm for 2 catheter. The vert catheter was successfully navigated into the left main pulmonary artery. A pulmonary arteriogram was performed confirming nearly occlusive thrombus. The catheter was successfully navigated into the left lower lobe pulmonary artery. A Rosen wire was placed. The catheter was removed. An 18 cm EKOS infusion catheter was advanced over the wire and positioned. This sheaths were secured to the skin with 0 silk suture. Thrombolysis was initiated at 1 mg/hr tPA through each catheter for a total of 2 milligrams/hour. FINDINGS: Pulmonary arterial pressure 52/21 (30) mmHg IMPRESSION: 1. Pulmonary arterial hypertension with bilateral large volume pulmonary emboli. 2. Successful initiation of bilateral pulmonary artery thrombolysis. Signed, Criselda Peaches, MD [facility] Radiology Electronically Signed   By: Jacqulynn Cadet M.D.   On: 03/19/2017 16:37   Ir US Guide Vasc Access Right  Result Date: 03/19/2017 INDICATION: 67 year old male with acute sub massive pulmonary embolism. He presents for catheter directed bilateral pulmonary artery thrombolysis. EXAM: IR INFUSION THROMBOL ARTERIAL INITIAL (MS); BILATERAL PULMONARY ARTERIOGRAPHY; IR ULTRASOUND GUIDANCE VASC ACCESS RIGHT; ADDITIONAL  ARTERIOGRAPHY COMPARISON:  CTA chest 03/18/2017 MEDICATIONS: None. ANESTHESIA/SEDATION: Versed 2 mg IV; Fentanyl 100 mcg IV Moderate Sedation Time:  30 minutes The patient was continuously monitored during the procedure by the interventional radiology nurse under my direct supervision. FLUOROSCOPY TIME:  Fluoroscopy Time: 10 minutes 36 seconds (77 mGy). COMPLICATIONS: None immediate. TECHNIQUE: Informed written consent was obtained from the patient after a thorough discussion of the procedural risks, benefits and alternatives. All questions were addressed.  Maximal Sterile Barrier Technique was utilized including caps, mask, sterile gowns, sterile gloves, sterile drape, hand hygiene and skin antiseptic. A timeout was performed prior to the initiation of the procedure. The right common femoral vein was interrogated with ultrasound and found to be widely patent. An image was obtained and stored for the medical record. Local anesthesia was attained by infiltration with 1% lidocaine. A small dermatotomy was made. Under real-time sonographic guidance, the vessel was punctured with a 21 gauge micropuncture needle. Using standard technique, the initial micro needle was exchanged over a 0.018 micro wire for a transitional 4 [place] micro sheath. The micro sheath was then exchanged over a 0.035 wire for a 6 French vascular sheath. Using the exact same technique, a second 6 [place] vascular sheath was also advanced into the right common femoral artery adjacent to the first. Through the more medial sheath, a C2 cobra catheter was advanced over a Bentson wire into the right atrium, through the right ventricle and into the main pulmonary artery. A main pulmonary arteriogram was performed. The pulmonary arterial pressure was 52/21 (30) mmHg. A main pulmonary arteriogram was performed. The pulmonary arteries are grossly enlarged. Filling defects are present bilaterally. The C2 cobra catheter was then exchanged over a Rosen wire for a a 100 cm Vert catheter. The vert catheter was successfully navigated into the right main pulmonary artery. A right pulmonary arteriogram was performed. This demonstrates nearly occlusive thrombus and demonstrates the anatomy. The catheter was then successfully navigated into the right lower lobe pulmonary artery. A Rosen wire was placed. A 12 cm EKOS infusion catheter was then advanced over the wire. Through the more lateral sheath, the C2 cobra catheter was reintroduced over a Bentson wire and again navigated into the main pulmonary artery. The C2 cobra  catheter was then exchanged for a 100 cm for 2 catheter. The vert catheter was successfully navigated into the left main pulmonary artery. A pulmonary arteriogram was performed confirming nearly occlusive thrombus. The catheter was successfully navigated into the left lower lobe pulmonary artery. A Rosen wire was placed. The catheter was removed. An 18 cm EKOS infusion catheter was advanced over the wire and positioned. This sheaths were secured to the skin with 0 silk suture. Thrombolysis was initiated at 1 mg/hr tPA through each catheter for a total of 2 milligrams/hour. FINDINGS: Pulmonary arterial pressure 52/21 (30) mmHg IMPRESSION: 1. Pulmonary arterial hypertension with bilateral large volume pulmonary emboli. 2. Successful initiation of bilateral pulmonary artery thrombolysis. Signed, Criselda Peaches, MD [facility] Radiology Electronically Signed   By: Jacqulynn Cadet M.D.   On: 03/19/2017 16:37   Ir Infusion Thrombol Arterial Initial (ms)  Result Date: 03/19/2017 INDICATION: 67 year old male with acute sub massive pulmonary embolism. He presents for catheter directed bilateral pulmonary artery thrombolysis. EXAM: IR INFUSION THROMBOL ARTERIAL INITIAL (MS); BILATERAL PULMONARY ARTERIOGRAPHY; IR ULTRASOUND GUIDANCE VASC ACCESS RIGHT; ADDITIONAL ARTERIOGRAPHY COMPARISON:  CTA chest 03/18/2017 MEDICATIONS: None. ANESTHESIA/SEDATION: Versed 2 mg IV; Fentanyl 100 mcg IV Moderate Sedation Time:  30  minutes The patient was continuously monitored during the procedure by the interventional radiology nurse under my direct supervision. FLUOROSCOPY TIME:  Fluoroscopy Time: 10 minutes 36 seconds (77 mGy). COMPLICATIONS: None immediate. TECHNIQUE: Informed written consent was obtained from the patient after a thorough discussion of the procedural risks, benefits and alternatives. All questions were addressed. Maximal Sterile Barrier Technique was utilized  including caps, mask, sterile gowns, sterile gloves, sterile drape, hand hygiene and skin antiseptic. A timeout was performed prior to the initiation of the procedure. The right common femoral vein was interrogated with ultrasound and found to be widely patent. An image was obtained and stored for the medical record. Local anesthesia was attained by infiltration with 1% lidocaine. A small dermatotomy was made. Under real-time sonographic guidance, the vessel was punctured with a 21 gauge micropuncture needle. Using standard technique, the initial micro needle was exchanged over a 0.018 micro wire for a transitional 4 [place] micro sheath. The micro sheath was then exchanged over a 0.035 wire for a 6 French vascular sheath. Using the exact same technique, a second 6 [place] vascular sheath was also advanced into the right common femoral artery adjacent to the first. Through the more medial sheath, a C2 cobra catheter was advanced over a Bentson wire into the right atrium, through the right ventricle and into the main pulmonary artery. A main pulmonary arteriogram was performed. The pulmonary arterial pressure was 52/21 (30) mmHg. A main pulmonary arteriogram was performed. The pulmonary arteries are grossly enlarged. Filling defects are present bilaterally. The C2 cobra catheter was then exchanged over a Rosen wire for a a 100 cm Vert catheter. The vert catheter was successfully navigated into the right main pulmonary artery. A right pulmonary arteriogram was performed. This demonstrates nearly occlusive thrombus and demonstrates the anatomy. The catheter was then successfully navigated into the right lower lobe pulmonary artery. A Rosen wire was placed. A 12 cm EKOS infusion catheter was then advanced over the wire. Through the more lateral sheath, the C2 cobra catheter was reintroduced over a Bentson wire and again navigated into the main pulmonary artery. The C2 cobra catheter was then exchanged for a 100 cm for 2  catheter. The vert catheter was successfully navigated into the left main pulmonary artery. A pulmonary arteriogram was performed confirming nearly occlusive thrombus. The catheter was successfully navigated into the left lower lobe pulmonary artery. A Rosen wire was placed. The catheter was removed. An 18 cm EKOS infusion catheter was advanced over the wire and positioned. This sheaths were secured to the skin with 0 silk suture. Thrombolysis was initiated at 1 mg/hr tPA through each catheter for a total of 2 milligrams/hour. FINDINGS: Pulmonary arterial pressure 52/21 (30) mmHg IMPRESSION: 1. Pulmonary arterial hypertension with bilateral large volume pulmonary emboli. 2. Successful initiation of bilateral pulmonary artery thrombolysis. Signed, Criselda Peaches, MD [facility] Radiology Electronically Signed   By: Jacqulynn Cadet M.D.   On: 03/19/2017 16:37   Ir Infusion Thrombol Arterial Initial (ms)  Result Date: 03/19/2017 INDICATION: 67 year old male with acute sub massive pulmonary embolism. He presents for catheter directed bilateral pulmonary artery thrombolysis. EXAM: IR INFUSION THROMBOL ARTERIAL INITIAL (MS); BILATERAL PULMONARY ARTERIOGRAPHY; IR ULTRASOUND GUIDANCE VASC ACCESS RIGHT; ADDITIONAL ARTERIOGRAPHY COMPARISON:  CTA chest 03/18/2017 MEDICATIONS: None. ANESTHESIA/SEDATION: Versed 2 mg IV; Fentanyl 100 mcg IV Moderate Sedation Time:  30 minutes The patient was continuously monitored during the procedure by the interventional radiology nurse under my direct supervision. FLUOROSCOPY TIME:  Fluoroscopy  Time: 10 minutes 36 seconds (77 mGy). COMPLICATIONS: None immediate. TECHNIQUE: Informed written consent was obtained from the patient after a thorough discussion of the procedural risks, benefits and alternatives. All questions were addressed. Maximal Sterile Barrier Technique was utilized including caps, mask, sterile gowns, sterile gloves,  sterile drape, hand hygiene and skin antiseptic. A timeout was performed prior to the initiation of the procedure. The right common femoral vein was interrogated with ultrasound and found to be widely patent. An image was obtained and stored for the medical record. Local anesthesia was attained by infiltration with 1% lidocaine. A small dermatotomy was made. Under real-time sonographic guidance, the vessel was punctured with a 21 gauge micropuncture needle. Using standard technique, the initial micro needle was exchanged over a 0.018 micro wire for a transitional 4 [place] micro sheath. The micro sheath was then exchanged over a 0.035 wire for a 6 French vascular sheath. Using the exact same technique, a second 6 [place] vascular sheath was also advanced into the right common femoral artery adjacent to the first. Through the more medial sheath, a C2 cobra catheter was advanced over a Bentson wire into the right atrium, through the right ventricle and into the main pulmonary artery. A main pulmonary arteriogram was performed. The pulmonary arterial pressure was 52/21 (30) mmHg. A main pulmonary arteriogram was performed. The pulmonary arteries are grossly enlarged. Filling defects are present bilaterally. The C2 cobra catheter was then exchanged over a Rosen wire for a a 100 cm Vert catheter. The vert catheter was successfully navigated into the right main pulmonary artery. A right pulmonary arteriogram was performed. This demonstrates nearly occlusive thrombus and demonstrates the anatomy. The catheter was then successfully navigated into the right lower lobe pulmonary artery. A Rosen wire was placed. A 12 cm EKOS infusion catheter was then advanced over the wire. Through the more lateral sheath, the C2 cobra catheter was reintroduced over a Bentson wire and again navigated into the main pulmonary artery. The C2 cobra catheter was then exchanged for a 100 cm for 2 catheter. The vert catheter was successfully navigated  into the left main pulmonary artery. A pulmonary arteriogram was performed confirming nearly occlusive thrombus. The catheter was successfully navigated into the left lower lobe pulmonary artery. A Rosen wire was placed. The catheter was removed. An 18 cm EKOS infusion catheter was advanced over the wire and positioned. This sheaths were secured to the skin with 0 silk suture. Thrombolysis was initiated at 1 mg/hr tPA through each catheter for a total of 2 milligrams/hour. FINDINGS: Pulmonary arterial pressure 52/21 (30) mmHg IMPRESSION: 1. Pulmonary arterial hypertension with bilateral large volume pulmonary emboli. 2. Successful initiation of bilateral pulmonary artery thrombolysis. Signed, Criselda Peaches, MD [facility] Radiology Electronically Signed   By: Jacqulynn Cadet M.D.   On: 03/19/2017 16:37   Ir Jacolyn Reedy F/u Elizabeth Sauer Art/ven Final Day (ms)  Result Date: 03/20/2017 INDICATION: 67 year old male with a history of sub massive pulmonary embolus. He underwent initiation of bilateral pulmonary artery thrombolysis yesterday. Re-evaluate pressures and removed catheters. EXAM: IR THROMB F/U EVAL ART/VEN FINAL DAY COMPARISON:  Pulmonary arteriography and catheter placement 03/19/2017 MEDICATIONS: None. ANESTHESIA/SEDATION: None FLUOROSCOPY TIME:  None COMPLICATIONS: None immediate. TECHNIQUE: Informed written consent was obtained from the patient after a thorough discussion of the procedural risks, benefits and alternatives. All questions were addressed. A timeout was performed prior to the initiation of the procedure. The catheters were pulled back into the main pulmonary artery. Pulmonary arterial  pressures were then obtained. The main pulmonary arterial pressure is 36/12 (22) mmHg. The catheters were removed. The sheaths were pulled and hemostasis attained by manual pressure. FINDINGS: The main pulmonary arterial pressure is 36/12 (22) mmHg IMPRESSION:  Significantly improved pulmonary arterial pressure with resolution of pulmonary arterial hypertension. Signed, Criselda Peaches, MD [facility] Radiology Electronically Signed   By: Jacqulynn Cadet M.D.   On: 03/20/2017 10:03    ASSESSMENT & PLAN: Kingston Shawgo is 67 year old male with medical history significant for factor V leiden mutation with recurrent DVT and pulmonary embolism   1. Recurrent DVT and pulmonary embolism, heterozygous Factor V Leiden -We reviewed the patient's medical records in detail. He has Factor V Leiden mutation with positive family history.  -He has recurrent DVT with saddle PE and large clot burden, s/p anticoagulation during hospitalization and initiated on Xarelto, which he will continue 20 mg daily indefinitely -He is aware to hold Xarelto for 3-5 days for invasive procedures then resume promptly when the risk of bleeding is decreased. -We reviewed its possible he has other genetic mutation or underlying condition that predispose him to clot formation, his treatment would not change.  -His children and family have been tested for factor V Leiden -He was given the option to follow up here as needed, he will maintain close f/u with PCP  2. Health maintenance  -We reviewed general health maintenance and age appropriate cancer screenings such as colonoscopy, which will be due for him at age 30, and annual PSA check for prostate cancer; he is a non-smoker -Encouraged him to eat healthy and gradually resume physical exercise  -He has strong family history of CAD and personal history of HL, continue statin per PCP   PLAN -Continue Xarelto 20 mg daily indefinitely  -F/u with PCP -F/u with [place] as needed   All questions were answered. The patient knows to call the clinic with any problems, questions or concerns. I spent 45 minutes counseling the patient face to face. The total time spent in the appointment was 55  minutes and more than 50% was on counseling.    Alla Feeling, NP 04/07/2017 3:24 PM  Addendum  I have seen the patient, examined him. I agree with the assessment and and plan and have edited the notes.   Mr Emel has known factor V Leiden mutation which is a high risk for thrombosis.  He has had one episode of provoked DVT in his arm after shoulder surgery, and second unprovoked saddle PE recently.  I strongly recommend continue anticoagulation indefinitely, if there is no bleeding issues.  We discussed different anticoagulation options, he is tolerating Xarelto well, will continue.  We discussed other hypercoagulopathy workup is probably not necessary, since it would not change his management.  He will follow-up with his primary care physician, and see me as needed in the future.  Truitt Merle  04/07/2017

## 2017-12-13 DIAGNOSIS — Z23 Encounter for immunization: Secondary | ICD-10-CM | POA: Diagnosis not present

## 2018-01-13 DIAGNOSIS — R972 Elevated prostate specific antigen [PSA]: Secondary | ICD-10-CM | POA: Diagnosis not present

## 2018-01-23 DIAGNOSIS — N5201 Erectile dysfunction due to arterial insufficiency: Secondary | ICD-10-CM | POA: Diagnosis not present

## 2018-01-23 DIAGNOSIS — R972 Elevated prostate specific antigen [PSA]: Secondary | ICD-10-CM | POA: Diagnosis not present

## 2018-05-11 DIAGNOSIS — Z1389 Encounter for screening for other disorder: Secondary | ICD-10-CM | POA: Diagnosis not present

## 2018-05-11 DIAGNOSIS — Z23 Encounter for immunization: Secondary | ICD-10-CM | POA: Diagnosis not present

## 2018-05-11 DIAGNOSIS — R972 Elevated prostate specific antigen [PSA]: Secondary | ICD-10-CM | POA: Diagnosis not present

## 2018-05-11 DIAGNOSIS — R7309 Other abnormal glucose: Secondary | ICD-10-CM | POA: Diagnosis not present

## 2018-05-11 DIAGNOSIS — I2699 Other pulmonary embolism without acute cor pulmonale: Secondary | ICD-10-CM | POA: Diagnosis not present

## 2018-05-11 DIAGNOSIS — M519 Unspecified thoracic, thoracolumbar and lumbosacral intervertebral disc disorder: Secondary | ICD-10-CM | POA: Diagnosis not present

## 2018-05-11 DIAGNOSIS — E7800 Pure hypercholesterolemia, unspecified: Secondary | ICD-10-CM | POA: Diagnosis not present

## 2018-05-11 DIAGNOSIS — K219 Gastro-esophageal reflux disease without esophagitis: Secondary | ICD-10-CM | POA: Diagnosis not present

## 2018-05-11 DIAGNOSIS — Z0000 Encounter for general adult medical examination without abnormal findings: Secondary | ICD-10-CM | POA: Diagnosis not present

## 2018-05-11 DIAGNOSIS — I10 Essential (primary) hypertension: Secondary | ICD-10-CM | POA: Diagnosis not present

## 2018-05-11 DIAGNOSIS — D682 Hereditary deficiency of other clotting factors: Secondary | ICD-10-CM | POA: Diagnosis not present

## 2018-05-11 DIAGNOSIS — R0602 Shortness of breath: Secondary | ICD-10-CM | POA: Diagnosis not present

## 2018-05-12 ENCOUNTER — Other Ambulatory Visit: Payer: Self-pay | Admitting: Internal Medicine

## 2018-05-12 ENCOUNTER — Other Ambulatory Visit (HOSPITAL_COMMUNITY): Payer: Self-pay | Admitting: Internal Medicine

## 2018-05-12 DIAGNOSIS — R0602 Shortness of breath: Secondary | ICD-10-CM

## 2018-05-19 ENCOUNTER — Ambulatory Visit (HOSPITAL_COMMUNITY): Payer: Medicare Other | Attending: Internal Medicine

## 2018-05-19 DIAGNOSIS — R0602 Shortness of breath: Secondary | ICD-10-CM | POA: Diagnosis not present

## 2018-05-19 MED ORDER — PERFLUTREN LIPID MICROSPHERE
1.0000 mL | INTRAVENOUS | Status: AC | PRN
  Administered 2018-05-19: 2 mL via INTRAVENOUS

## 2018-06-02 DIAGNOSIS — Z1283 Encounter for screening for malignant neoplasm of skin: Secondary | ICD-10-CM | POA: Diagnosis not present

## 2018-06-02 DIAGNOSIS — D225 Melanocytic nevi of trunk: Secondary | ICD-10-CM | POA: Diagnosis not present

## 2018-06-02 DIAGNOSIS — L918 Other hypertrophic disorders of the skin: Secondary | ICD-10-CM | POA: Diagnosis not present

## 2018-06-02 DIAGNOSIS — L111 Transient acantholytic dermatosis [Grover]: Secondary | ICD-10-CM | POA: Diagnosis not present

## 2018-07-14 DIAGNOSIS — R972 Elevated prostate specific antigen [PSA]: Secondary | ICD-10-CM | POA: Diagnosis not present

## 2018-07-24 DIAGNOSIS — R972 Elevated prostate specific antigen [PSA]: Secondary | ICD-10-CM | POA: Diagnosis not present

## 2018-07-24 DIAGNOSIS — N5201 Erectile dysfunction due to arterial insufficiency: Secondary | ICD-10-CM | POA: Diagnosis not present

## 2018-11-06 DIAGNOSIS — I10 Essential (primary) hypertension: Secondary | ICD-10-CM | POA: Diagnosis not present

## 2018-11-06 DIAGNOSIS — R972 Elevated prostate specific antigen [PSA]: Secondary | ICD-10-CM | POA: Diagnosis not present

## 2018-11-06 DIAGNOSIS — R7303 Prediabetes: Secondary | ICD-10-CM | POA: Diagnosis not present

## 2018-11-06 DIAGNOSIS — I2699 Other pulmonary embolism without acute cor pulmonale: Secondary | ICD-10-CM | POA: Diagnosis not present

## 2018-11-06 DIAGNOSIS — E7800 Pure hypercholesterolemia, unspecified: Secondary | ICD-10-CM | POA: Diagnosis not present

## 2018-11-06 DIAGNOSIS — D682 Hereditary deficiency of other clotting factors: Secondary | ICD-10-CM | POA: Diagnosis not present

## 2018-12-05 DIAGNOSIS — Z23 Encounter for immunization: Secondary | ICD-10-CM | POA: Diagnosis not present

## 2019-01-18 DIAGNOSIS — R972 Elevated prostate specific antigen [PSA]: Secondary | ICD-10-CM | POA: Diagnosis not present

## 2019-01-25 ENCOUNTER — Other Ambulatory Visit: Payer: Self-pay

## 2019-01-25 ENCOUNTER — Ambulatory Visit (HOSPITAL_COMMUNITY)
Admission: EM | Admit: 2019-01-25 | Discharge: 2019-01-25 | Disposition: A | Discharge status: Home / Self Care | Payer: Medicare Other | Attending: Family Medicine | Admitting: Family Medicine

## 2019-01-25 ENCOUNTER — Encounter (HOSPITAL_COMMUNITY): Payer: Self-pay | Admitting: Emergency Medicine

## 2019-01-25 DIAGNOSIS — S90122A Contusion of left lesser toe(s) without damage to nail, initial encounter: Secondary | ICD-10-CM | POA: Diagnosis not present

## 2019-01-25 DIAGNOSIS — N5201 Erectile dysfunction due to arterial insufficiency: Secondary | ICD-10-CM | POA: Diagnosis not present

## 2019-01-25 DIAGNOSIS — R972 Elevated prostate specific antigen [PSA]: Secondary | ICD-10-CM | POA: Diagnosis not present

## 2019-01-25 NOTE — ED Provider Notes (Signed)
[place]   [id number] 01/25/19 Arrival Time: [place]:  1. Contusion of lesser toe of left foot without damage to nail, initial encounter     Suspect mild trauma/friction from shoe as cause. He is comfortable with observation. No sign of infection or vascular/neurolgical compromise. No indication for imaging of toe today.  Recommend: Follow-up Information    Wenda Low, MD.   Specialty: Internal Medicine Why: If worsening or failing to improve as anticipated. Contact information: [address] [place] [address] [phone]           Reviewed expectations re: course of current medical issues. Questions answered. Outlined signs and symptoms indicating need for more acute intervention. Patient verbalized understanding. After Visit Summary given.  SUBJECTIVE: History from: patient. Jose Bowers is a 68 y.o. male who reports noticing redness of his left distal 3rd toe; mild last evening; darker this morning; localized to distal toe at nailfold; no bleeding or drainage. Without pain. No toe sensation changes. Ambulatory without difficulty. No specific aggravating or alleviating factors reported. Associated symptoms: none reported. Self treatment: has not tried OTC therapies.  History of similar: no.  Past Surgical History:  Procedure Laterality Date  . HERNIA REPAIR    . IR ANGIOGRAM PULMONARY BILATERAL SELECTIVE  03/19/2017  . IR ANGIOGRAM SELECTIVE EACH ADDITIONAL VESSEL  03/19/2017  . IR ANGIOGRAM SELECTIVE EACH ADDITIONAL VESSEL  03/19/2017  . IR INFUSION THROMBOL ARTERIAL INITIAL (MS)  03/19/2017  . IR INFUSION THROMBOL ARTERIAL INITIAL (MS)  03/19/2017  . IR THROMB F/U EVAL ART/VEN FINAL DAY (MS)  03/20/2017  . IR US GUIDE VASC ACCESS RIGHT  03/19/2017  . JOINT REPLACEMENT    . ROTATOR CUFF REPAIR       ROS: As per HPI. All other systems negative.    OBJECTIVE:  Vitals:   01/25/19 1014  BP: 132/76  Pulse: 68   Resp: 12  Temp: 98.5 F (36.9 C)  TempSrc: Oral  SpO2: 100%    General appearance: alert; no distress HEENT: [place]; AT Neck: supple with FROM Resp: unlabored respirations Extremities: . LLE: warm with well perfused appearance; distal 3rd superior toe with approx 0.5 x 1 cm area of what appears to be a very small hematoma; mild surrounding bruising; toenails are thick and brittle; normal distal sensation; no specific TTP; able to move toes normally "but that toe feels stiff" CV: 1+ DP pulse of LLE. Skin: warm and dry; no visible rashes Neurologic: gait normal; normal sensation of LLE; normal strength of LLE Psychological: alert and cooperative; normal mood and affect    No Known Allergies  Past Medical History:  Diagnosis Date  . Factor V Leiden ([place])   . Hypertension    Social History   Socioeconomic History  . Marital status: Married    Spouse name: Not on file  . Number of children: Not on file  . Years of education: Not on file  . Highest education level: Not on file  Occupational History  . Occupation: retired     Comment: Dietitian   Social Needs  . Financial resource strain: Not on file  . Food insecurity    Worry: Not on file    Inability: Not on file  . Transportation needs    Medical: Not on file    Non-medical: Not on file  Tobacco Use  . Smoking status: Never Smoker  . Smokeless tobacco: Never Used  Substance and Sexual Activity  . Alcohol  use: Yes    Comment: "few times a week"  . Drug use: No  . Sexual activity: Not on file  Lifestyle  . Physical activity    Days per week: Not on file    Minutes per session: Not on file  . Stress: Not on file  Relationships  . Social Herbalist on phone: Not on file    Gets together: Not on file    Attends religious service: Not on file    Active member of club or organization: Not on file    Attends meetings of clubs or organizations: Not on file    Relationship status: Not on file  Other  Topics Concern  . Not on file  Social History Narrative  . Not on file   Family History  Problem Relation Age of Onset  . Clotting disorder Father   . Clotting disorder Brother   . Clotting disorder Son   . Clotting disorder Grandchild    Past Surgical History:  Procedure Laterality Date  . HERNIA REPAIR    . IR ANGIOGRAM PULMONARY BILATERAL SELECTIVE  03/19/2017  . IR ANGIOGRAM SELECTIVE EACH ADDITIONAL VESSEL  03/19/2017  . IR ANGIOGRAM SELECTIVE EACH ADDITIONAL VESSEL  03/19/2017  . IR INFUSION THROMBOL ARTERIAL INITIAL (MS)  03/19/2017  . IR INFUSION THROMBOL ARTERIAL INITIAL (MS)  03/19/2017  . IR THROMB F/U EVAL ART/VEN FINAL DAY (MS)  03/20/2017  . IR US GUIDE VASC ACCESS RIGHT  03/19/2017  . JOINT REPLACEMENT    . ROTATOR CUFF REPAIR        Vanessa Kick, MD 01/25/19 1053

## 2019-01-25 NOTE — ED Triage Notes (Signed)
Pt reports redness and swelling to his middle left toe.  Pt does not know of any injury.

## 2019-01-26 DIAGNOSIS — Z20828 Contact with and (suspected) exposure to other viral communicable diseases: Secondary | ICD-10-CM | POA: Diagnosis not present

## 2019-02-26 ENCOUNTER — Telehealth: Payer: Medicare Other | Admitting: Physician Assistant

## 2019-02-26 DIAGNOSIS — M545 Low back pain, unspecified: Secondary | ICD-10-CM

## 2019-02-26 NOTE — Progress Notes (Signed)
Hi Jose Bowers,  I'm sorry you are in so much pain.  Based on what you shared with me, I feel your condition warrants further evaluation and I recommend that you be seen for a face to face office visit. The back pain you describe is too complex for an e-visit and we are limited with the pain medications we prescribe during e-visits. Have you seen your primary care provider regarding this issue?  You may need a referral to a specialist or physical therapy.  Please start here.   If you need immediate care, see below for our Urgent Care locations.    NOTE: If you entered your credit card information for this eVisit, you will not be charged. You may see a "hold" on your card for the $35 but that hold will drop off and you will not have a charge processed.   If you are having a true medical emergency please call 911.      For an urgent face to face visit, [facility] has five urgent care centers for your convenience:      NEW:  [facility] [place] [facility] [place] Get Driving Directions [id number] [place], [place] [address] . 10 am - 6pm Monday - Friday    [facility] [place] [facility]) Get Driving Directions [id number] [address] [place], [place] [address] . 10 am to 8 pm Monday-Friday . 12 pm to 8 pm [facility] Urgent Care at [facility] Get Driving Directions [id number] [place], [place], [place] [address] . 8 am to 8 pm Monday-Friday . 9 am to 6 pm Saturday . 11 am to 6 pm Sunday     [facility] at [facility] Get Driving Directions  [id number] [address] [place], [place] [address] . 8 am to 8 pm Monday-Friday . 8 am to 4 pm [facility]gent Care at [place] Get Driving Directions [id number] [address] [place], [place] [address] . 12 pm to 6 pm Monday-Friday      Your e-visit answers were reviewed by  a board certified advanced clinical practitioner to complete your personal care plan.  Thank you for using e-Visits.

## 2019-05-05 ENCOUNTER — Ambulatory Visit: Payer: Medicare Other | Attending: Internal Medicine

## 2019-05-05 ENCOUNTER — Other Ambulatory Visit: Payer: Self-pay

## 2019-05-05 DIAGNOSIS — Z23 Encounter for immunization: Secondary | ICD-10-CM

## 2019-05-05 NOTE — Progress Notes (Signed)
   Covid-19 Vaccination Clinic  Name:  Jose Bowers    MRN: [id number] DOB: [birth year]  05/05/2019  [name] was observed post Covid-19 immunization for 15 minutes without incidence. He was provided with Vaccine Information Sheet and instruction to access the V-Safe system.   [name] was instructed to call 911 with any severe reactions post vaccine: Marland Kitchen Difficulty breathing  . Swelling of your face and throat  . A fast heartbeat  . A bad rash all over your body  . Dizziness and weakness    Immunizations Administered    Name Date Dose VIS Date Route   Pfizer COVID-19 Vaccine 05/05/2019  8:27 AM 0.3 mL 02/23/2019 Intramuscular   Manufacturer: [place]   Lot: [id number]   [place]: [id number]

## 2019-05-09 DIAGNOSIS — H00015 Hordeolum externum left lower eyelid: Secondary | ICD-10-CM | POA: Diagnosis not present

## 2019-05-29 ENCOUNTER — Ambulatory Visit: Payer: Medicare Other | Attending: Internal Medicine

## 2019-05-29 DIAGNOSIS — Z23 Encounter for immunization: Secondary | ICD-10-CM

## 2019-05-29 NOTE — Progress Notes (Signed)
   Covid-19 Vaccination Clinic  Name:  Tsering Mardirossian    MRN: [id number] DOB: [birth year]  05/29/2019  Mr. Poarch was observed post Covid-19 immunization for 15 minutes without incident. He was provided with Vaccine Information Sheet and instruction to access the V-Safe system.   Mr. Cuartas was instructed to call 911 with any severe reactions post vaccine: Marland Kitchen Difficulty breathing  . Swelling of face and throat  . A fast heartbeat  . A bad rash all over body  . Dizziness and weakness   Immunizations Administered    Name Date Dose VIS Date Route   Pfizer COVID-19 Vaccine 05/29/2019  8:41 AM 0.3 mL 02/23/2019 Intramuscular   Manufacturer: [place]   Lot: [id number]   [place]: [id number]

## 2019-05-30 DIAGNOSIS — Z0000 Encounter for general adult medical examination without abnormal findings: Secondary | ICD-10-CM | POA: Diagnosis not present

## 2019-05-30 DIAGNOSIS — D682 Hereditary deficiency of other clotting factors: Secondary | ICD-10-CM | POA: Diagnosis not present

## 2019-05-30 DIAGNOSIS — R7309 Other abnormal glucose: Secondary | ICD-10-CM | POA: Diagnosis not present

## 2019-05-30 DIAGNOSIS — L111 Transient acantholytic dermatosis [Grover]: Secondary | ICD-10-CM | POA: Diagnosis not present

## 2019-05-30 DIAGNOSIS — K219 Gastro-esophageal reflux disease without esophagitis: Secondary | ICD-10-CM | POA: Diagnosis not present

## 2019-05-30 DIAGNOSIS — I10 Essential (primary) hypertension: Secondary | ICD-10-CM | POA: Diagnosis not present

## 2019-05-30 DIAGNOSIS — E7800 Pure hypercholesterolemia, unspecified: Secondary | ICD-10-CM | POA: Diagnosis not present

## 2019-05-30 DIAGNOSIS — R972 Elevated prostate specific antigen [PSA]: Secondary | ICD-10-CM | POA: Diagnosis not present

## 2019-05-30 DIAGNOSIS — Z1389 Encounter for screening for other disorder: Secondary | ICD-10-CM | POA: Diagnosis not present

## 2019-05-30 DIAGNOSIS — I2699 Other pulmonary embolism without acute cor pulmonale: Secondary | ICD-10-CM | POA: Diagnosis not present

## 2019-05-30 DIAGNOSIS — M519 Unspecified thoracic, thoracolumbar and lumbosacral intervertebral disc disorder: Secondary | ICD-10-CM | POA: Diagnosis not present

## 2019-07-31 DIAGNOSIS — R972 Elevated prostate specific antigen [PSA]: Secondary | ICD-10-CM | POA: Diagnosis not present

## 2019-08-07 DIAGNOSIS — N5201 Erectile dysfunction due to arterial insufficiency: Secondary | ICD-10-CM | POA: Diagnosis not present

## 2019-08-07 DIAGNOSIS — R972 Elevated prostate specific antigen [PSA]: Secondary | ICD-10-CM | POA: Diagnosis not present

## 2019-09-18 NOTE — Progress Notes (Signed)
Cardiology Office Note:    Date:  09/22/2019   ID:  Jose Bowers, DOB 15-Oct-1950, MRN [id number]  PCP:  Wenda Low, MD  Cardiologist:  No primary care provider on file.  Electrophysiologist:  None   Referring MD: Wenda Low, MD   Chief Complaint  Patient presents with  . Coronary Artery Disease    History of Present Illness:    Jose Bowers is a 69 y.o. male with a hx of factor V Leiden, hypertension, hyperlipidemia, DVT following shoulder surgery in 2014, saddle PE treated with EKOS in 2019 who is referred by Dr. Lysle Rubens for evaluation of chest pain.  He reports that he has been having pain in his lower chest/upper abdomen.  Describes as a burning pain that occurs daily.  States that it occurs with exertion, but only if he has eaten something.  He has tried taking a PPI with no improvement in his pain.  States that if he exercises after eating he will have burning chest pain that resolves with rest.  Also reports he is short of breath with exertion.  He denies any lightheadedness, syncope, palpitations, or lower extremity edema.  No smoking history.  Family history includes father died of suspected MI in 29s and brother had CABG in 36s.   Past Medical History:  Diagnosis Date  . Factor V Leiden ([place])   . Hypertension     Past Surgical History:  Procedure Laterality Date  . HERNIA REPAIR    . IR ANGIOGRAM PULMONARY BILATERAL SELECTIVE  03/19/2017  . IR ANGIOGRAM SELECTIVE EACH ADDITIONAL VESSEL  03/19/2017  . IR ANGIOGRAM SELECTIVE EACH ADDITIONAL VESSEL  03/19/2017  . IR INFUSION THROMBOL ARTERIAL INITIAL (MS)  03/19/2017  . IR INFUSION THROMBOL ARTERIAL INITIAL (MS)  03/19/2017  . IR THROMB F/U EVAL ART/VEN FINAL DAY (MS)  03/20/2017  . IR US GUIDE VASC ACCESS RIGHT  03/19/2017  . JOINT REPLACEMENT    . ROTATOR CUFF REPAIR      Current Medications: No outpatient medications have been marked as taking for the 09/19/19 encounter (Office Visit) with Donato Heinz, MD.      Allergies:   Patient has no known allergies.   Social History   Socioeconomic History  . Marital status: Married    Spouse name: Not on file  . Number of children: Not on file  . Years of education: Not on file  . Highest education level: Not on file  Occupational History  . Occupation: retired     Comment: Dietitian   Tobacco Use  . Smoking status: Never Smoker  . Smokeless tobacco: Never Used  Vaping Use  . Vaping Use: Never used  Substance and Sexual Activity  . Alcohol use: Yes    Comment: "few times a week"  . Drug use: No  . Sexual activity: Not on file  Other Topics Concern  . Not on file  Social History Narrative  . Not on file   Social Determinants of Health   Financial Resource Strain:   . Difficulty of Paying Living Expenses:   Food Insecurity:   . Worried About Charity fundraiser in the Last Year:   . Arboriculturist in the Last Year:   Transportation Needs:   . Film/video editor (Medical):   Marland Kitchen Lack of Transportation (Non-Medical):   Physical Activity:   . Days of Exercise per Week:   . Minutes of Exercise per Session:   Stress:   . Feeling of Stress :  Social Connections:   . Frequency of Communication with Friends and Family:   . Frequency of Social Gatherings with Friends and Family:   . Attends Religious Services:   . Active Member of Clubs or Organizations:   . Attends Archivist Meetings:   Marland Kitchen Marital Status:      Family History: The patient's family history includes Clotting disorder in his brother, father, grandchild, and son.  ROS:   Please see the history of present illness.     All other systems reviewed and are negative.  EKGs/Labs/Other Studies Reviewed:    The following studies were reviewed today:   EKG:  EKG is ordered today.  The ekg ordered today demonstrates normal sinus rhythm, rate 63, no ST abnormalities  Recent Labs: No results found for requested labs within last 8760 hours.  Recent  Lipid Panel    Component Value Date/Time   CHOL 130 03/22/2017 0436   TRIG 129 03/22/2017 0436   HDL 35 (L) 03/22/2017 0436   CHOLHDL 3.7 03/22/2017 0436   VLDL 26 03/22/2017 0436   LDLCALC 69 03/22/2017 0436    Physical Exam:    VS:  BP 122/78 (BP Location: Left Arm)   Pulse 63   Ht 5\' 10"  (1.778 m)   Wt 218 lb 3.2 oz (99 kg)   SpO2 97%   BMI 31.31 kg/m     Wt Readings from Last 3 Encounters:  09/19/19 218 lb 3.2 oz (99 kg)  04/07/17 217 lb 9.6 oz (98.7 kg)  03/22/17 216 lb 1.6 oz (98 kg)     GEN: in no acute distress HEENT: Normal NECK: No JVD; No carotid bruits CARDIAC: RRR, no murmurs, rubs, gallops RESPIRATORY:  Clear to auscultation without rales, wheezing or rhonchi  ABDOMEN: Soft, non-tender, non-distended MUSCULOSKELETAL:  No edema; No deformity  SKIN: Warm and dry NEUROLOGIC:  Alert and oriented x 3 PSYCHIATRIC:  Normal affect   ASSESSMENT:    1. Chest pain of uncertain etiology   2. Essential hypertension   3. Hyperlipidemia, unspecified hyperlipidemia type    PLAN:    Chest pain: Atypical in description, but does occur with exertion and patient has significant CAD risk factors (hypertension, hyperlipidemia, age, family history).  Will evaluate further with exercise Myoview.  Hypertension: On losartan-hydrochlorothiazide 50-12.5 mg daily.  Appears controlled  Hyperlipidemia: On atorvastatin 10 mg daily.  LDL 89 on 05/30/19.  Will check calcium score to guide how aggressive to be in managing cholesterol  Factor V Leiden: History of DVT and PE.  On Xarelto  RTC in 3 months  Medication Adjustments/Labs and Tests Ordered: Current medicines are reviewed at length with the patient today.  Concerns regarding medicines are outlined above.  Orders Placed This Encounter  Procedures  . CT CARDIAC SCORING  . MYOCARDIAL PERFUSION IMAGING  . EKG 12-Lead   No orders of the defined types were placed in this encounter.   Patient Instructions  Medication  Instructions:  Your physician recommends that you continue on your current medications as directed. Please refer to the Current Medication list given to you today.  Testing/Procedures: Your physician has requested that you have an exercise stress myoview. For further information please visit [email or web address]. Please follow instruction sheet, as given.   CT coronary calcium score. This test is done at [address]. This is $99 out of pocket.   Coronary CalciumScan A coronary calcium scan is an imaging test used to look for deposits of calcium and other  fatty materials (plaques) in the inner lining of the blood vessels of the heart (coronary arteries). These deposits of calcium and plaques can partly clog and narrow the coronary arteries without producing any symptoms or warning signs. This puts a person at risk for a heart attack. This test can detect these deposits before symptoms develop. Tell a health care provider about:  Any allergies you have.  All medicines you are taking, including vitamins, herbs, eye drops, creams, and over-the-counter medicines.  Any problems you or family members have had with anesthetic medicines.  Any blood disorders you have.  Any surgeries you have had.  Any medical conditions you have.  Whether you are pregnant or may be pregnant. What are the risks? Generally, this is a safe procedure. However, problems may occur, including:  Harm to a pregnant woman and her unborn baby. This test involves the use of radiation. Radiation exposure can be dangerous to a pregnant woman and her unborn baby. If you are pregnant, you generally should not have this procedure done.  Slight increase in the risk of cancer. This is because of the radiation involved in the test. What happens before the procedure? No preparation is needed for this procedure. What happens during the procedure?  You will undress and remove any jewelry around your neck or  chest.  You will put on a hospital gown.  Sticky electrodes will be placed on your chest. The electrodes will be connected to an electrocardiogram (ECG) machine to record a tracing of the electrical activity of your heart.  A CT scanner will take pictures of your heart. During this time, you will be asked to lie still and hold your breath for 2-3 seconds while a picture of your heart is being taken. The procedure may vary among health care providers and hospitals. What happens after the procedure?  You can get dressed.  You can return to your normal activities.  It is up to you to get the results of your test. Ask your health care provider, or the department that is doing the test, when your results will be ready. Summary  A coronary calcium scan is an imaging test used to look for deposits of calcium and other fatty materials (plaques) in the inner lining of the blood vessels of the heart (coronary arteries).  Generally, this is a safe procedure. Tell your health care provider if you are pregnant or may be pregnant.  No preparation is needed for this procedure.  A CT scanner will take pictures of your heart.  You can return to your normal activities after the scan is done. This information is not intended to replace advice given to you by your health care provider. Make sure you discuss any questions you have with your health care provider. Document Released: 08/28/2007 Document Revised: 01/19/2016 Document Reviewed: 01/19/2016 Elsevier Interactive Patient Education  2017 [employer]: At [facility], you and your health needs are our priority.  As part of our continuing mission to provide you with exceptional heart care, we have created designated Provider Care Teams.  These Care Teams include your primary Cardiologist (physician) and Advanced Practice Providers (APPs -  Physician Assistants and Nurse Practitioners) who all work together to provide you with the care  you need, when you need it.  We recommend signing up for the patient portal called "MyChart".  Sign up information is provided on this After Visit Summary.  MyChart is used to connect with patients for Virtual Visits (Telemedicine).  Patients are able to view lab/test results, encounter notes, upcoming appointments, etc.  Non-urgent messages can be sent to your provider as well.   To learn more about what you can do with MyChart, go to [email or web address].    Your next appointment:   3 month(s)  The format for your next appointment:   In Person  Provider:   Oswaldo Milian, MD         Signed, Donato Heinz, MD  09/22/2019 8:48 PM    [place]

## 2019-09-19 ENCOUNTER — Ambulatory Visit (INDEPENDENT_AMBULATORY_CARE_PROVIDER_SITE_OTHER): Payer: Medicare Other | Admitting: Cardiology

## 2019-09-19 ENCOUNTER — Other Ambulatory Visit: Payer: Self-pay

## 2019-09-19 ENCOUNTER — Encounter: Payer: Self-pay | Admitting: Cardiology

## 2019-09-19 VITALS — BP 122/78 | HR 63 | Ht 70.0 in | Wt 218.2 lb

## 2019-09-19 DIAGNOSIS — E785 Hyperlipidemia, unspecified: Secondary | ICD-10-CM | POA: Diagnosis not present

## 2019-09-19 DIAGNOSIS — R079 Chest pain, unspecified: Secondary | ICD-10-CM | POA: Diagnosis not present

## 2019-09-19 DIAGNOSIS — I10 Essential (primary) hypertension: Secondary | ICD-10-CM

## 2019-09-19 NOTE — Patient Instructions (Signed)
Medication Instructions:  Your physician recommends that you continue on your current medications as directed. Please refer to the Current Medication list given to you today.  Testing/Procedures: Your physician has requested that you have an exercise stress myoview. For further information please visit [email or web address]. Please follow instruction sheet, as given.   CT coronary calcium score. This test is done at [address]. This is $99 out of pocket.   Coronary CalciumScan A coronary calcium scan is an imaging test used to look for deposits of calcium and other fatty materials (plaques) in the inner lining of the blood vessels of the heart (coronary arteries). These deposits of calcium and plaques can partly clog and narrow the coronary arteries without producing any symptoms or warning signs. This puts a person at risk for a heart attack. This test can detect these deposits before symptoms develop. Tell a health care provider about:  Any allergies you have.  All medicines you are taking, including vitamins, herbs, eye drops, creams, and over-the-counter medicines.  Any problems you or family members have had with anesthetic medicines.  Any blood disorders you have.  Any surgeries you have had.  Any medical conditions you have.  Whether you are pregnant or may be pregnant. What are the risks? Generally, this is a safe procedure. However, problems may occur, including:  Harm to a pregnant woman and her unborn baby. This test involves the use of radiation. Radiation exposure can be dangerous to a pregnant woman and her unborn baby. If you are pregnant, you generally should not have this procedure done.  Slight increase in the risk of cancer. This is because of the radiation involved in the test. What happens before the procedure? No preparation is needed for this procedure. What happens during the procedure?  You will undress and remove any jewelry around  your neck or chest.  You will put on a hospital gown.  Sticky electrodes will be placed on your chest. The electrodes will be connected to an electrocardiogram (ECG) machine to record a tracing of the electrical activity of your heart.  A CT scanner will take pictures of your heart. During this time, you will be asked to lie still and hold your breath for 2-3 seconds while a picture of your heart is being taken. The procedure may vary among health care providers and hospitals. What happens after the procedure?  You can get dressed.  You can return to your normal activities.  It is up to you to get the results of your test. Ask your health care provider, or the department that is doing the test, when your results will be ready. Summary  A coronary calcium scan is an imaging test used to look for deposits of calcium and other fatty materials (plaques) in the inner lining of the blood vessels of the heart (coronary arteries).  Generally, this is a safe procedure. Tell your health care provider if you are pregnant or may be pregnant.  No preparation is needed for this procedure.  A CT scanner will take pictures of your heart.  You can return to your normal activities after the scan is done. This information is not intended to replace advice given to you by your health care provider. Make sure you discuss any questions you have with your health care provider. Document Released: 08/28/2007 Document Revised: 01/19/2016 Document Reviewed: 01/19/2016 Elsevier Interactive Patient Education  2017 [employer].   Follow-Up: At [facility], you and your health needs are  our priority.  As part of our continuing mission to provide you with exceptional heart care, we have created designated Provider Care Teams.  These Care Teams include your primary Cardiologist (physician) and Advanced Practice Providers (APPs -  Physician Assistants and Nurse Practitioners) who all work together to provide you  with the care you need, when you need it.  We recommend signing up for the patient portal called "MyChart".  Sign up information is provided on this After Visit Summary.  MyChart is used to connect with patients for Virtual Visits (Telemedicine).  Patients are able to view lab/test results, encounter notes, upcoming appointments, etc.  Non-urgent messages can be sent to your provider as well.   To learn more about what you can do with MyChart, go to [email or web address].    Your next appointment:   3 month(s)  The format for your next appointment:   In Person  Provider:   Oswaldo Milian, MD

## 2019-10-02 ENCOUNTER — Telehealth (HOSPITAL_COMMUNITY): Payer: Self-pay

## 2019-10-02 NOTE — Telephone Encounter (Signed)
Encounter complete. 

## 2019-10-04 ENCOUNTER — Telehealth: Payer: Self-pay | Admitting: Cardiology

## 2019-10-04 ENCOUNTER — Other Ambulatory Visit: Payer: Self-pay

## 2019-10-04 ENCOUNTER — Ambulatory Visit (HOSPITAL_COMMUNITY)
Admission: RE | Admit: 2019-10-04 | Discharge: 2019-10-04 | Disposition: A | Discharge status: Home / Self Care | Payer: Medicare Other | Source: Ambulatory Visit | Attending: Cardiology | Admitting: Cardiology

## 2019-10-04 DIAGNOSIS — R079 Chest pain, unspecified: Secondary | ICD-10-CM | POA: Diagnosis not present

## 2019-10-04 LAB — MYOCARDIAL PERFUSION IMAGING
LV dias vol: 162 mL (ref 62–150)
LV sys vol: 83 mL
Peak HR: 115 {beats}/min
Rest HR: 57 {beats}/min
SDS: 5
SRS: 1
SSS: 6
TID: 1.12

## 2019-10-04 MED ORDER — TECHNETIUM TC 99M TETROFOSMIN IV KIT
30.4000 | PACK | Freq: Once | INTRAVENOUS | Status: AC | PRN
  Administered 2019-10-04: 30.4 via INTRAVENOUS
  Filled 2019-10-04: qty 31

## 2019-10-04 MED ORDER — REGADENOSON 0.4 MG/5ML IV SOLN
0.4000 mg | Freq: Once | INTRAVENOUS | Status: AC
  Administered 2019-10-04: 0.4 mg via INTRAVENOUS

## 2019-10-04 MED ORDER — TECHNETIUM TC 99M TETROFOSMIN IV KIT
10.9000 | PACK | Freq: Once | INTRAVENOUS | Status: AC | PRN
  Administered 2019-10-04: 10.9 via INTRAVENOUS
  Filled 2019-10-04: qty 11

## 2019-10-04 NOTE — Telephone Encounter (Signed)
Patient states he is returning a call regarding scheduling a CT and scheduling a follow up appointment with Dr. Gardiner Rhyme in 1 week. However, Dr. Gardiner Rhyme does not have any availability for an appointment in 1 week. Please assist.

## 2019-10-04 NOTE — Telephone Encounter (Signed)
Spoke to patient-patient aware CT calcium score cancelled on 7/26.  appt to see Dr. Gardiner Rhyme on 7/27

## 2019-10-08 ENCOUNTER — Inpatient Hospital Stay: Admission: RE | Admit: 2019-10-08 | Payer: Medicare Other | Source: Ambulatory Visit

## 2019-10-08 NOTE — Progress Notes (Signed)
Cardiology Office Note:    Date:  10/09/2019   ID:  Jose Bowers, DOB 1951/01/03, MRN [id number]  PCP:  Wenda Low, MD  Cardiologist:  No primary care provider on file.  Electrophysiologist:  None   Referring MD: Wenda Low, MD   Chief Complaint  Patient presents with  . Chest Pain    History of Present Illness:    Jose Bowers is a 69 y.o. male with a hx of factor V Leiden, hypertension, hyperlipidemia, DVT following shoulder surgery in 2014, saddle PE treated with EKOS in 2019 who presents for follow-up.  He was referred by Dr. Lysle Rubens for evaluation of chest pain, initially seen on 09/19/2019.  He reports that he has been having pain in his lower chest/upper abdomen.  Describes as a burning pain that occurs daily.  States that it occurs with exertion, but only if he has eaten something.  He has tried taking a PPI with no improvement in his pain.  States that if he exercises after eating he will have burning chest pain that resolves with rest.  Also reports he is short of breath with exertion.  He denies any lightheadedness, syncope, palpitations, or lower extremity edema.  No smoking history.  Family history includes father died of suspected MI in 31s and brother had CABG in 87s.   Exercise Myoview was attempted on 10/04/2019.  He did not reach target heart rate on treadmill and was converted to pharmacologic study.  Despite not reaching target heart rate, he had 1.5 mm horizontal ST depressions in inferior and anterolateral leads and symptoms of chest pain.  MPI imaging showed reversible defect in apical anterior wall and apex consistent with LAD ischemia.  LVEF 49%.  Since last clinic visit, he reports that he has been doing well.  States that his chest pain is improved.  Most exertion he does has been walking his dog and doing yard work.  He reports intermittent epigastric pain.  He has been taking Xarelto, denies any bleeding.   Past Medical History:  Diagnosis Date  .  Factor V Leiden ([place])   . Hypertension     Past Surgical History:  Procedure Laterality Date  . HERNIA REPAIR    . IR ANGIOGRAM PULMONARY BILATERAL SELECTIVE  03/19/2017  . IR ANGIOGRAM SELECTIVE EACH ADDITIONAL VESSEL  03/19/2017  . IR ANGIOGRAM SELECTIVE EACH ADDITIONAL VESSEL  03/19/2017  . IR INFUSION THROMBOL ARTERIAL INITIAL (MS)  03/19/2017  . IR INFUSION THROMBOL ARTERIAL INITIAL (MS)  03/19/2017  . IR THROMB F/U EVAL ART/VEN FINAL DAY (MS)  03/20/2017  . IR US GUIDE VASC ACCESS RIGHT  03/19/2017  . JOINT REPLACEMENT    . ROTATOR CUFF REPAIR      Current Medications: Current Meds  Medication Sig  . amoxicillin (AMOXIL) 500 MG capsule Patient only takes this prior to have any dental work done.  Marland Kitchen atorvastatin (LIPITOR) 20 MG tablet Take 1 tablet (20 mg total) by mouth daily.  . fluocinonide cream (LIDEX) 7.35 % Apply 1 application topically daily as needed (itching/rash (Grover's disease)).  Marland Kitchen losartan-hydrochlorothiazide (HYZAAR) 50-12.5 MG tablet Take 1 tablet by mouth daily.  . pantoprazole (PROTONIX) 40 MG tablet Take 40 mg by mouth daily.  Alveda Reasons 20 MG TABS tablet Take 20 mg by mouth daily.  . [DISCONTINUED] atorvastatin (LIPITOR) 10 MG tablet Take 10 mg by mouth daily.     Allergies:   Patient has no known allergies.   Social History   Socioeconomic History  . Marital  status: Married    Spouse name: Not on file  . Number of children: Not on file  . Years of education: Not on file  . Highest education level: Not on file  Occupational History  . Occupation: retired     Comment: Dietitian   Tobacco Use  . Smoking status: Never Smoker  . Smokeless tobacco: Never Used  Vaping Use  . Vaping Use: Never used  Substance and Sexual Activity  . Alcohol use: Yes    Comment: "few times a week"  . Drug use: No  . Sexual activity: Not on file  Other Topics Concern  . Not on file  Social History Narrative  . Not on file   Social Determinants of Health   Financial  Resource Strain:   . Difficulty of Paying Living Expenses:   Food Insecurity:   . Worried About Charity fundraiser in the Last Year:   . Arboriculturist in the Last Year:   Transportation Needs:   . Film/video editor (Medical):   Marland Kitchen Lack of Transportation (Non-Medical):   Physical Activity:   . Days of Exercise per Week:   . Minutes of Exercise per Session:   Stress:   . Feeling of Stress :   Social Connections:   . Frequency of Communication with Friends and Family:   . Frequency of Social Gatherings with Friends and Family:   . Attends Religious Services:   . Active Member of Clubs or Organizations:   . Attends Archivist Meetings:   Marland Kitchen Marital Status:      Family History: The patient's family history includes Clotting disorder in his brother, father, grandchild, and son.  ROS:   Please see the history of present illness.     All other systems reviewed and are negative.  EKGs/Labs/Other Studies Reviewed:    The following studies were reviewed today:   EKG:  EKG is ordered today.  The ekg ordered today demonstrates normal sinus rhythm, rate 72, no ST abnormalities  Recent Labs: No results found for requested labs within last 8760 hours.  Recent Lipid Panel    Component Value Date/Time   CHOL 130 03/22/2017 0436   TRIG 129 03/22/2017 0436   HDL 35 (L) 03/22/2017 0436   CHOLHDL 3.7 03/22/2017 0436   VLDL 26 03/22/2017 0436   LDLCALC 69 03/22/2017 0436    Physical Exam:    VS:  BP 120/74   Pulse 72   Ht 5\' 10"  (1.778 m)   Wt (!) 221 lb 9.6 oz (100.5 kg)   SpO2 92%   BMI 31.80 kg/m     Wt Readings from Last 3 Encounters:  10/09/19 (!) 221 lb 9.6 oz (100.5 kg)  10/04/19 218 lb (98.9 kg)  09/19/19 218 lb 3.2 oz (99 kg)     GEN: in no acute distress HEENT: Normal NECK: No JVD; No carotid bruits CARDIAC: RRR, no murmurs, rubs, gallops RESPIRATORY:  Clear to auscultation without rales, wheezing or rhonchi  ABDOMEN: Soft, non-tender,  non-distended MUSCULOSKELETAL:  No edema; No deformity  SKIN: Warm and dry NEUROLOGIC:  Alert and oriented x 3 PSYCHIATRIC:  Normal affect   ASSESSMENT:    1. Chest pain of uncertain etiology   2. Pre-procedure lab exam   3. Abnormal nuclear stress test   4. Systolic dysfunction   5. Essential hypertension   6. Hyperlipidemia, unspecified hyperlipidemia type    PLAN:    Chest pain: Atypical in description, but does  occur with exertion and patient has significant CAD risk factors (hypertension, hyperlipidemia, age, family history).  Exercise Myoview was attempted on 10/04/2019.  He did not reach target heart rate on treadmill and was converted to pharmacologic study.  Despite not reaching target heart rate, he had 1.5 mm horizontal ST depressions in inferior and anterolateral leads and symptoms of chest pain.  MPI imaging showed reversible defect in apical anterior wall and apex consistent with LAD ischemia.  LVEF 49%. -Recommend cardiac catheterization, scheduled for 10/16/2019 with Dr. Ellyn Hack.  Risks and benefits of cardiac catheterization have been discussed with the patient.  These include bleeding, infection, kidney damage, stroke, heart attack, death.  The patient understands these risks and is willing to proceed. -PRN SL NTG  Systolic dysfunction: EF 42% on recent Myoview.  Will evaluate further with echocardiogram  Hypertension: On losartan-hydrochlorothiazide 50-12.5 mg daily.  Appears controlled  Hyperlipidemia: On atorvastatin 10 mg daily.  LDL 89 on 05/30/19.  Will increase to 20 mg daily  Factor V Leiden: History of DVT and PE.  On Xarelto  RTC in 1 month  Medication Adjustments/Labs and Tests Ordered: Current medicines are reviewed at length with the patient today.  Concerns regarding medicines are outlined above.  Orders Placed This Encounter  Procedures  . Basic metabolic panel  . CBC  . EKG 12-Lead  . ECHOCARDIOGRAM COMPLETE   Meds ordered this encounter   Medications  . nitroGLYCERIN (NITROSTAT) 0.4 MG SL tablet    Sig: Place 1 tablet (0.4 mg total) under the tongue every 5 (five) minutes as needed for chest pain.    Dispense:  25 tablet    Refill:  3  . atorvastatin (LIPITOR) 20 MG tablet    Sig: Take 1 tablet (20 mg total) by mouth daily.    Dispense:  90 tablet    Refill:  3    Patient Instructions     [place] [address] Dept: [phone] Loc: [phone]  Omarri Eich  10/09/2019  You are scheduled for a Cardiac Catheterization on Tuesday, August 3 with Dr. Glenetta Hew.  1. Please arrive at the [facility] (Main Entrance A) at [facility]: [address] [place], [place] [address] at 10:00 AM (This time is two hours before your procedure to ensure your preparation). Free valet parking service is available.   Special note: Every effort is made to have your procedure done on time. Please understand that emergencies sometimes delay scheduled procedures.  2. Diet: Do not eat solid foods after midnight.  The patient may have clear liquids until 5am upon the day of the procedure.  3. Labs: BMET and CBC. You will need to have blood drawn Today or this week. You do not need to be fasting.  4. Medication instructions in preparation for your procedure:   Contrast Allergy: No   DO NOT take Xarelto Sunday August 1st or Monday August 2nd. Last dose of Xarelto Saturday July 31  Do not take Losartan- Hydroclorothiazide the morning of procedure    On the morning of your procedure, take your Aspirin and any morning medicines NOT listed above.  You may use sips of water.  5. Plan for one night stay--bring personal belongings. 6. Bring a current list of your medications and current insurance cards. 7. You MUST have a responsible person to drive you home. 8. Someone MUST be with you the first 24 hours  after  you arrive home or your discharge will be delayed. 9. Please wear clothes that are easy to get on and off and wear slip-on shoes.  Thank you for allowing [place] to care for you!   -- [facility]   Medication Changes Increase Atorvastatin to 20 mg daily Sublingual Nitro as needed  Your physician has requested that you have an echocardiogram. Echocardiography is a painless test that uses sound waves to create images of your heart. It provides your doctor with information about the size and shape of your heart and how well your heart's chambers and valves are working. This procedure takes approximately one hour. There are no restrictions for this procedure.  1 Month Follow Up with Dr. Gardiner Rhyme    Signed, Donato Heinz, MD  10/09/2019 1:38 PM    [place]

## 2019-10-08 NOTE — H&P (View-Only) (Signed)
Cardiology Office Note:    Date:  10/09/2019   ID:  Jose Bowers, DOB [birth year], MRN [id number]  PCP:  Wenda Low, MD  Cardiologist:  No primary care provider on file.  Electrophysiologist:  None   Referring MD: Wenda Low, MD   Chief Complaint  Patient presents with  . Chest Pain    History of Present Illness:    Jose Bowers is a 69 y.o. male with a hx of factor V Leiden, hypertension, hyperlipidemia, DVT following shoulder surgery in 2014, saddle PE treated with EKOS in 2019 who presents for follow-up.  He was referred by Dr. Lysle Rubens for evaluation of chest pain, initially seen on 09/19/2019.  He reports that he has been having pain in his lower chest/upper abdomen.  Describes as a burning pain that occurs daily.  States that it occurs with exertion, but only if he has eaten something.  He has tried taking a PPI with no improvement in his pain.  States that if he exercises after eating he will have burning chest pain that resolves with rest.  Also reports he is short of breath with exertion.  He denies any lightheadedness, syncope, palpitations, or lower extremity edema.  No smoking history.  Family history includes father died of suspected MI in 67s and brother had CABG in 61s.   Exercise Myoview was attempted on 10/04/2019.  He did not reach target heart rate on treadmill and was converted to pharmacologic study.  Despite not reaching target heart rate, he had 1.5 mm horizontal ST depressions in inferior and anterolateral leads and symptoms of chest pain.  MPI imaging showed reversible defect in apical anterior wall and apex consistent with LAD ischemia.  LVEF 49%.  Since last clinic visit, he reports that he has been doing well.  States that his chest pain is improved.  Most exertion he does has been walking his dog and doing yard work.  He reports intermittent epigastric pain.  He has been taking Xarelto, denies any bleeding.   Past Medical History:  Diagnosis Date  .  Factor V Leiden ([place])   . Hypertension     Past Surgical History:  Procedure Laterality Date  . HERNIA REPAIR    . IR ANGIOGRAM PULMONARY BILATERAL SELECTIVE  03/19/2017  . IR ANGIOGRAM SELECTIVE EACH ADDITIONAL VESSEL  03/19/2017  . IR ANGIOGRAM SELECTIVE EACH ADDITIONAL VESSEL  03/19/2017  . IR INFUSION THROMBOL ARTERIAL INITIAL (MS)  03/19/2017  . IR INFUSION THROMBOL ARTERIAL INITIAL (MS)  03/19/2017  . IR THROMB F/U EVAL ART/VEN FINAL DAY (MS)  03/20/2017  . IR US GUIDE VASC ACCESS RIGHT  03/19/2017  . JOINT REPLACEMENT    . ROTATOR CUFF REPAIR      Current Medications: Current Meds  Medication Sig  . amoxicillin (AMOXIL) 500 MG capsule Patient only takes this prior to have any dental work done.  Marland Kitchen atorvastatin (LIPITOR) 20 MG tablet Take 1 tablet (20 mg total) by mouth daily.  . fluocinonide cream (LIDEX) 0.17 % Apply 1 application topically daily as needed (itching/rash (Grover's disease)).  Marland Kitchen losartan-hydrochlorothiazide (HYZAAR) 50-12.5 MG tablet Take 1 tablet by mouth daily.  . pantoprazole (PROTONIX) 40 MG tablet Take 40 mg by mouth daily.  Alveda Reasons 20 MG TABS tablet Take 20 mg by mouth daily.  . [DISCONTINUED] atorvastatin (LIPITOR) 10 MG tablet Take 10 mg by mouth daily.     Allergies:   Patient has no known allergies.   Social History   Socioeconomic History  . Marital  status: Married    Spouse name: Not on file  . Number of children: Not on file  . Years of education: Not on file  . Highest education level: Not on file  Occupational History  . Occupation: retired     Comment: Dietitian   Tobacco Use  . Smoking status: Never Smoker  . Smokeless tobacco: Never Used  Vaping Use  . Vaping Use: Never used  Substance and Sexual Activity  . Alcohol use: Yes    Comment: "few times a week"  . Drug use: No  . Sexual activity: Not on file  Other Topics Concern  . Not on file  Social History Narrative  . Not on file   Social Determinants of Health   Financial  Resource Strain:   . Difficulty of Paying Living Expenses:   Food Insecurity:   . Worried About Charity fundraiser in the Last Year:   . Arboriculturist in the Last Year:   Transportation Needs:   . Film/video editor (Medical):   Marland Kitchen Lack of Transportation (Non-Medical):   Physical Activity:   . Days of Exercise per Week:   . Minutes of Exercise per Session:   Stress:   . Feeling of Stress :   Social Connections:   . Frequency of Communication with Friends and Family:   . Frequency of Social Gatherings with Friends and Family:   . Attends Religious Services:   . Active Member of Clubs or Organizations:   . Attends Archivist Meetings:   Marland Kitchen Marital Status:      Family History: The patient's family history includes Clotting disorder in his brother, father, grandchild, and son.  ROS:   Please see the history of present illness.     All other systems reviewed and are negative.  EKGs/Labs/Other Studies Reviewed:    The following studies were reviewed today:   EKG:  EKG is ordered today.  The ekg ordered today demonstrates normal sinus rhythm, rate 72, no ST abnormalities  Recent Labs: No results found for requested labs within last 8760 hours.  Recent Lipid Panel    Component Value Date/Time   CHOL 130 03/22/2017 0436   TRIG 129 03/22/2017 0436   HDL 35 (L) 03/22/2017 0436   CHOLHDL 3.7 03/22/2017 0436   VLDL 26 03/22/2017 0436   LDLCALC 69 03/22/2017 0436    Physical Exam:    VS:  BP 120/74   Pulse 72   Ht 5\' 10"  (1.778 m)   Wt (!) 221 lb 9.6 oz (100.5 kg)   SpO2 92%   BMI 31.80 kg/m     Wt Readings from Last 3 Encounters:  10/09/19 (!) 221 lb 9.6 oz (100.5 kg)  10/04/19 218 lb (98.9 kg)  09/19/19 218 lb 3.2 oz (99 kg)     GEN: in no acute distress HEENT: Normal NECK: No JVD; No carotid bruits CARDIAC: RRR, no murmurs, rubs, gallops RESPIRATORY:  Clear to auscultation without rales, wheezing or rhonchi  ABDOMEN: Soft, non-tender,  non-distended MUSCULOSKELETAL:  No edema; No deformity  SKIN: Warm and dry NEUROLOGIC:  Alert and oriented x 3 PSYCHIATRIC:  Normal affect   ASSESSMENT:    1. Chest pain of uncertain etiology   2. Pre-procedure lab exam   3. Abnormal nuclear stress test   4. Systolic dysfunction   5. Essential hypertension   6. Hyperlipidemia, unspecified hyperlipidemia type    PLAN:    Chest pain: Atypical in description, but does  occur with exertion and patient has significant CAD risk factors (hypertension, hyperlipidemia, age, family history).  Exercise Myoview was attempted on 10/04/2019.  He did not reach target heart rate on treadmill and was converted to pharmacologic study.  Despite not reaching target heart rate, he had 1.5 mm horizontal ST depressions in inferior and anterolateral leads and symptoms of chest pain.  MPI imaging showed reversible defect in apical anterior wall and apex consistent with LAD ischemia.  LVEF 49%. -Recommend cardiac catheterization, scheduled for 10/16/2019 with Dr. Ellyn Hack.  Risks and benefits of cardiac catheterization have been discussed with the patient.  These include bleeding, infection, kidney damage, stroke, heart attack, death.  The patient understands these risks and is willing to proceed. -PRN SL NTG  Systolic dysfunction: EF 94% on recent Myoview.  Will evaluate further with echocardiogram  Hypertension: On losartan-hydrochlorothiazide 50-12.5 mg daily.  Appears controlled  Hyperlipidemia: On atorvastatin 10 mg daily.  LDL 89 on 05/30/19.  Will increase to 20 mg daily  Factor V Leiden: History of DVT and PE.  On Xarelto  RTC in 1 month  Medication Adjustments/Labs and Tests Ordered: Current medicines are reviewed at length with the patient today.  Concerns regarding medicines are outlined above.  Orders Placed This Encounter  Procedures  . Basic metabolic panel  . CBC  . EKG 12-Lead  . ECHOCARDIOGRAM COMPLETE   Meds ordered this encounter   Medications  . nitroGLYCERIN (NITROSTAT) 0.4 MG SL tablet    Sig: Place 1 tablet (0.4 mg total) under the tongue every 5 (five) minutes as needed for chest pain.    Dispense:  25 tablet    Refill:  3  . atorvastatin (LIPITOR) 20 MG tablet    Sig: Take 1 tablet (20 mg total) by mouth daily.    Dispense:  90 tablet    Refill:  3    Patient Instructions     [place] [address] Dept: [phone] Loc: [phone]  Lopez Dentinger  10/09/2019  You are scheduled for a Cardiac Catheterization on Tuesday, August 3 with Dr. Glenetta Hew.  1. Please arrive at the [facility] (Main Entrance A) at [facility]: [address] [place], [place] [address] at 10:00 AM (This time is two hours before your procedure to ensure your preparation). Free valet parking service is available.   Special note: Every effort is made to have your procedure done on time. Please understand that emergencies sometimes delay scheduled procedures.  2. Diet: Do not eat solid foods after midnight.  The patient may have clear liquids until 5am upon the day of the procedure.  3. Labs: BMET and CBC. You will need to have blood drawn Today or this week. You do not need to be fasting.  4. Medication instructions in preparation for your procedure:   Contrast Allergy: No   DO NOT take Xarelto Sunday August 1st or Monday August 2nd. Last dose of Xarelto Saturday July 31  Do not take Losartan- Hydroclorothiazide the morning of procedure    On the morning of your procedure, take your Aspirin and any morning medicines NOT listed above.  You may use sips of water.  5. Plan for one night stay--bring personal belongings. 6. Bring a current list of your medications and current insurance cards. 7. You MUST have a responsible person to drive you home. 8. Someone MUST be with you the first 24 hours  after  you arrive home or your discharge will be delayed. 9. Please wear clothes that are easy to get on and off and wear slip-on shoes.  Thank you for allowing [place] to care for you!   -- [facility]   Medication Changes Increase Atorvastatin to 20 mg daily Sublingual Nitro as needed  Your physician has requested that you have an echocardiogram. Echocardiography is a painless test that uses sound waves to create images of your heart. It provides your doctor with information about the size and shape of your heart and how well your heart's chambers and valves are working. This procedure takes approximately one hour. There are no restrictions for this procedure.  1 Month Follow Up with Dr. Gardiner Rhyme    Signed, Donato Heinz, MD  10/09/2019 1:38 PM    [place]

## 2019-10-09 ENCOUNTER — Encounter: Payer: Self-pay | Admitting: Cardiology

## 2019-10-09 ENCOUNTER — Ambulatory Visit (INDEPENDENT_AMBULATORY_CARE_PROVIDER_SITE_OTHER): Payer: Medicare Other | Admitting: Cardiology

## 2019-10-09 ENCOUNTER — Other Ambulatory Visit: Payer: Self-pay

## 2019-10-09 VITALS — BP 120/74 | HR 72 | Ht 70.0 in | Wt 221.6 lb

## 2019-10-09 DIAGNOSIS — R079 Chest pain, unspecified: Secondary | ICD-10-CM | POA: Diagnosis not present

## 2019-10-09 DIAGNOSIS — E785 Hyperlipidemia, unspecified: Secondary | ICD-10-CM | POA: Diagnosis not present

## 2019-10-09 DIAGNOSIS — I10 Essential (primary) hypertension: Secondary | ICD-10-CM

## 2019-10-09 DIAGNOSIS — R9439 Abnormal result of other cardiovascular function study: Secondary | ICD-10-CM

## 2019-10-09 DIAGNOSIS — Z01812 Encounter for preprocedural laboratory examination: Secondary | ICD-10-CM

## 2019-10-09 DIAGNOSIS — I519 Heart disease, unspecified: Secondary | ICD-10-CM

## 2019-10-09 MED ORDER — ATORVASTATIN CALCIUM 20 MG PO TABS: 20.0000 mg | ORAL_TABLET | Freq: Every day | ORAL | 3 refills | Status: DC

## 2019-10-09 MED ORDER — NITROGLYCERIN 0.4 MG SL SUBL: 0.4000 mg | SUBLINGUAL_TABLET | SUBLINGUAL | 3 refills | Status: DC | PRN

## 2019-10-09 NOTE — Patient Instructions (Signed)
° ° °  [place] [address] Dept: [phone] Loc: [phone]  Calhoun Reichardt  10/09/2019  You are scheduled for a Cardiac Catheterization on Tuesday, August 3 with Dr. Glenetta Hew.  1. Please arrive at the [facility] (Main Entrance A) at [facility]: [address] [place], [place] [address] at 10:00 AM (This time is two hours before your procedure to ensure your preparation). Free valet parking service is available.   Special note: Every effort is made to have your procedure done on time. Please understand that emergencies sometimes delay scheduled procedures.  2. Diet: Do not eat solid foods after midnight.  The patient may have clear liquids until 5am upon the day of the procedure.  3. Labs: BMET and CBC. You will need to have blood drawn Today or this week. You do not need to be fasting.  4. Medication instructions in preparation for your procedure:   Contrast Allergy: No   DO NOT take Xarelto Sunday August 1st or Monday August 2nd. Last dose of Xarelto Saturday July 31  Do not take Losartan- Hydroclorothiazide the morning of procedure    On the morning of your procedure, take your Aspirin and any morning medicines NOT listed above.  You may use sips of water.  5. Plan for one night stay--bring personal belongings. 6. Bring a current list of your medications and current insurance cards. 7. You MUST have a responsible person to drive you home. 8. Someone MUST be with you the first 24 hours after you arrive home or your discharge will be delayed. 9. Please wear clothes that are easy to get on and off and wear slip-on shoes.  Thank you for allowing [place] to care for you!   -- [facility]   Medication Changes Increase Atorvastatin to 20 mg daily Sublingual Nitro as needed  Your physician has requested that you have an  echocardiogram. Echocardiography is a painless test that uses sound waves to create images of your heart. It provides your doctor with information about the size and shape of your heart and how well your hearts chambers and valves are working. This procedure takes approximately one hour. There are no restrictions for this procedure.  1 Month Follow Up with Dr. Gardiner Rhyme

## 2019-10-10 LAB — BASIC METABOLIC PANEL
BUN/Creatinine Ratio: 16 (ref 10–24)
BUN: 15 mg/dL (ref 8–27)
CO2: 26 mmol/L (ref 20–29)
Calcium: 9.7 mg/dL (ref 8.6–10.2)
Chloride: 100 mmol/L (ref 96–106)
Creatinine, Ser: 0.95 mg/dL (ref 0.76–1.27)
GFR calc Af Amer: 96 mL/min/{1.73_m2} (ref >59)
GFR calc non Af Amer: 83 mL/min/{1.73_m2} (ref >59)
Glucose: 91 mg/dL (ref 65–99)
Potassium: 5.1 mmol/L (ref 3.5–5.2)
Sodium: 143 mmol/L (ref 134–144)

## 2019-10-10 LAB — CBC
Hematocrit: 46.3 % (ref 37.5–51.0)
Hemoglobin: 15.4 g/dL (ref 13.0–17.7)
MCH: 31.2 pg (ref 26.6–33.0)
MCHC: 33.3 g/dL (ref 31.5–35.7)
MCV: 94 fL (ref 79–97)
Platelets: 267 10*3/uL (ref 150–450)
RBC: 4.94 x10E6/uL (ref 4.14–5.80)
RDW: 12.2 % (ref 11.6–15.4)
WBC: 5.4 10*3/uL (ref 3.4–10.8)

## 2019-10-15 ENCOUNTER — Telehealth: Payer: Self-pay | Admitting: *Deleted

## 2019-10-15 NOTE — Telephone Encounter (Signed)
Pt contacted pre-catheterization scheduled at [facility] for: Tuesday October 16, 2019 12 Noon Verified arrival time and place: [facility]) at: 10 AM   No solid food after midnight prior to cath, clear liquids until 5 AM day of procedure.  Hold: Xarelto-none 10/14/19 until post procedure Losartan-HCT-AM of procedure  Except hold medications AM meds can be  taken pre-cath with sips of water including: ASA 81 mg   Confirmed patient has responsible adult to drive home post procedure and observe 24 hours after arriving home: yes  You are allowed ONE visitor in the waiting room during your procedure. Both you and your visitor must wear a mask once you enter the hospital.      COVID-19 Pre-Screening Questions:   In the past 14 days have you had a new cough associated with shortness of breath, fever (100.4 or greater) or sudden loss of taste or sense of smell? no  In the past 14 days have you been around anyone with known Covid 19? no  Have you been vaccinated for COVID-19? Yes, see immunization history   Reviewed procedure/mask/visitor instructions, COVID-19 questions with patient.

## 2019-10-16 ENCOUNTER — Other Ambulatory Visit: Payer: Self-pay | Admitting: *Deleted

## 2019-10-16 ENCOUNTER — Encounter (HOSPITAL_COMMUNITY)
Admission: RE | Disposition: A | Discharge status: Home / Self Care | Payer: Self-pay | Source: Ambulatory Visit | Attending: Surgery

## 2019-10-16 ENCOUNTER — Inpatient Hospital Stay (HOSPITAL_COMMUNITY)
Admission: RE | Admit: 2019-10-16 | Discharge: 2019-10-22 | DRG: 234 | Disposition: A | Discharge status: Home / Self Care | Payer: Medicare Other | Source: Ambulatory Visit | Attending: Surgery | Admitting: Surgery

## 2019-10-16 ENCOUNTER — Encounter (HOSPITAL_COMMUNITY): Payer: Self-pay | Admitting: Cardiology

## 2019-10-16 ENCOUNTER — Other Ambulatory Visit: Payer: Self-pay

## 2019-10-16 ENCOUNTER — Inpatient Hospital Stay (HOSPITAL_COMMUNITY): Payer: Medicare Other

## 2019-10-16 DIAGNOSIS — I2510 Atherosclerotic heart disease of native coronary artery without angina pectoris: Secondary | ICD-10-CM | POA: Diagnosis not present

## 2019-10-16 DIAGNOSIS — E8770 Fluid overload, unspecified: Secondary | ICD-10-CM | POA: Diagnosis not present

## 2019-10-16 DIAGNOSIS — I25119 Atherosclerotic heart disease of native coronary artery with unspecified angina pectoris: Principal | ICD-10-CM

## 2019-10-16 DIAGNOSIS — IMO0001 Reserved for inherently not codable concepts without codable children: Secondary | ICD-10-CM

## 2019-10-16 DIAGNOSIS — Z8249 Family history of ischemic heart disease and other diseases of the circulatory system: Secondary | ICD-10-CM

## 2019-10-16 DIAGNOSIS — E875 Hyperkalemia: Secondary | ICD-10-CM | POA: Diagnosis not present

## 2019-10-16 DIAGNOSIS — J90 Pleural effusion, not elsewhere classified: Secondary | ICD-10-CM

## 2019-10-16 DIAGNOSIS — I973 Postprocedural hypertension: Secondary | ICD-10-CM | POA: Diagnosis not present

## 2019-10-16 DIAGNOSIS — Z01810 Encounter for preprocedural cardiovascular examination: Secondary | ICD-10-CM | POA: Diagnosis not present

## 2019-10-16 DIAGNOSIS — Z86711 Personal history of pulmonary embolism: Secondary | ICD-10-CM

## 2019-10-16 DIAGNOSIS — I10 Essential (primary) hypertension: Secondary | ICD-10-CM | POA: Diagnosis not present

## 2019-10-16 DIAGNOSIS — Z20822 Contact with and (suspected) exposure to covid-19: Secondary | ICD-10-CM | POA: Diagnosis not present

## 2019-10-16 DIAGNOSIS — Z86718 Personal history of other venous thrombosis and embolism: Secondary | ICD-10-CM | POA: Diagnosis not present

## 2019-10-16 DIAGNOSIS — Z832 Family history of diseases of the blood and blood-forming organs and certain disorders involving the immune mechanism: Secondary | ICD-10-CM

## 2019-10-16 DIAGNOSIS — Z79899 Other long term (current) drug therapy: Secondary | ICD-10-CM

## 2019-10-16 DIAGNOSIS — I209 Angina pectoris, unspecified: Secondary | ICD-10-CM | POA: Diagnosis present

## 2019-10-16 DIAGNOSIS — Z0189 Encounter for other specified special examinations: Secondary | ICD-10-CM

## 2019-10-16 DIAGNOSIS — I517 Cardiomegaly: Secondary | ICD-10-CM | POA: Diagnosis not present

## 2019-10-16 DIAGNOSIS — D6851 Activated protein C resistance: Secondary | ICD-10-CM | POA: Diagnosis not present

## 2019-10-16 DIAGNOSIS — J9811 Atelectasis: Secondary | ICD-10-CM | POA: Diagnosis not present

## 2019-10-16 DIAGNOSIS — I200 Unstable angina: Secondary | ICD-10-CM

## 2019-10-16 DIAGNOSIS — I2584 Coronary atherosclerosis due to calcified coronary lesion: Secondary | ICD-10-CM | POA: Diagnosis present

## 2019-10-16 DIAGNOSIS — Z01811 Encounter for preprocedural respiratory examination: Secondary | ICD-10-CM

## 2019-10-16 DIAGNOSIS — Z4682 Encounter for fitting and adjustment of non-vascular catheter: Secondary | ICD-10-CM | POA: Diagnosis not present

## 2019-10-16 DIAGNOSIS — I25110 Atherosclerotic heart disease of native coronary artery with unstable angina pectoris: Secondary | ICD-10-CM | POA: Diagnosis not present

## 2019-10-16 DIAGNOSIS — Z01818 Encounter for other preprocedural examination: Secondary | ICD-10-CM | POA: Diagnosis not present

## 2019-10-16 DIAGNOSIS — R9439 Abnormal result of other cardiovascular function study: Secondary | ICD-10-CM | POA: Diagnosis present

## 2019-10-16 DIAGNOSIS — E785 Hyperlipidemia, unspecified: Secondary | ICD-10-CM | POA: Diagnosis present

## 2019-10-16 DIAGNOSIS — I081 Rheumatic disorders of both mitral and tricuspid valves: Secondary | ICD-10-CM | POA: Diagnosis not present

## 2019-10-16 DIAGNOSIS — I2699 Other pulmonary embolism without acute cor pulmonale: Secondary | ICD-10-CM | POA: Diagnosis not present

## 2019-10-16 DIAGNOSIS — Z951 Presence of aortocoronary bypass graft: Secondary | ICD-10-CM

## 2019-10-16 HISTORY — PX: LEFT HEART CATH AND CORONARY ANGIOGRAPHY: CATH118249

## 2019-10-16 LAB — ECHOCARDIOGRAM COMPLETE
AR max vel: 3.03 cm2
AV Area VTI: 3.19 cm2
AV Area mean vel: 2.83 cm2
AV Mean grad: 2 mmHg
AV Peak grad: 3.9 mmHg
Ao pk vel: 0.99 m/s
Area-P 1/2: 2.45 cm2
Height: 70 in
S' Lateral: 3.4 cm
Weight: 3480 oz

## 2019-10-16 LAB — SARS CORONAVIRUS 2 BY RT PCR (HOSPITAL ORDER, PERFORMED IN ~~LOC~~ HOSPITAL LAB): SARS Coronavirus 2: NEGATIVE

## 2019-10-16 SURGERY — LEFT HEART CATH AND CORONARY ANGIOGRAPHY
Anesthesia: LOCAL

## 2019-10-16 MED ORDER — VERAPAMIL HCL 2.5 MG/ML IV SOLN
INTRAVENOUS | Status: DC | PRN
  Administered 2019-10-16: 10 mL via INTRA_ARTERIAL

## 2019-10-16 MED ORDER — MIDAZOLAM HCL 2 MG/2ML IJ SOLN
INTRAMUSCULAR | Status: DC | PRN
  Administered 2019-10-16: 2 mg via INTRAVENOUS

## 2019-10-16 MED ORDER — HEPARIN SODIUM (PORCINE) 1000 UNIT/ML IJ SOLN
INTRAMUSCULAR | Status: AC
  Filled 2019-10-16: qty 1

## 2019-10-16 MED ORDER — VERAPAMIL HCL 2.5 MG/ML IV SOLN
INTRAVENOUS | Status: AC
  Filled 2019-10-16: qty 2

## 2019-10-16 MED ORDER — IOHEXOL 350 MG/ML SOLN
INTRAVENOUS | Status: DC | PRN
  Administered 2019-10-16: 75 mL

## 2019-10-16 MED ORDER — ASPIRIN 81 MG PO CHEW: 81.0000 mg | CHEWABLE_TABLET | ORAL | Status: DC

## 2019-10-16 MED ORDER — SODIUM CHLORIDE 0.9 % WEIGHT BASED INFUSION: 1.0000 mL/kg/h | INTRAVENOUS | Status: DC

## 2019-10-16 MED ORDER — SODIUM CHLORIDE 0.9% FLUSH
3.0000 mL | Freq: Two times a day (BID) | INTRAVENOUS | Status: DC
  Administered 2019-10-17: 3 mL via INTRAVENOUS

## 2019-10-16 MED ORDER — LOSARTAN POTASSIUM-HCTZ 50-12.5 MG PO TABS: 1.0000 | ORAL_TABLET | Freq: Every day | ORAL | Status: DC

## 2019-10-16 MED ORDER — ATORVASTATIN CALCIUM 80 MG PO TABS
80.0000 mg | ORAL_TABLET | Freq: Every day | ORAL | Status: DC
  Administered 2019-10-17 – 2019-10-21 (×4): 80 mg via ORAL
  Filled 2019-10-16 (×4): qty 1

## 2019-10-16 MED ORDER — LIDOCAINE HCL (PF) 1 % IJ SOLN
INTRAMUSCULAR | Status: DC | PRN
  Administered 2019-10-16: 2 mL

## 2019-10-16 MED ORDER — SODIUM CHLORIDE 0.9 % IV SOLN: 250.0000 mL | INTRAVENOUS | Status: DC | PRN

## 2019-10-16 MED ORDER — FENTANYL CITRATE (PF) 100 MCG/2ML IJ SOLN
INTRAMUSCULAR | Status: DC | PRN
  Administered 2019-10-16: 50 ug via INTRAVENOUS

## 2019-10-16 MED ORDER — AMOXICILLIN 500 MG PO CAPS: 2000.0000 mg | ORAL_CAPSULE | ORAL | Status: DC

## 2019-10-16 MED ORDER — HYDROCHLOROTHIAZIDE 12.5 MG PO CAPS: 12.5000 mg | ORAL_CAPSULE | Freq: Every day | ORAL | Status: DC

## 2019-10-16 MED ORDER — HYDRALAZINE HCL 20 MG/ML IJ SOLN: 10.0000 mg | INTRAMUSCULAR | Status: AC | PRN

## 2019-10-16 MED ORDER — ONDANSETRON HCL 4 MG/2ML IJ SOLN: 4.0000 mg | Freq: Four times a day (QID) | INTRAMUSCULAR | Status: DC | PRN

## 2019-10-16 MED ORDER — PERFLUTREN LIPID MICROSPHERE
1.0000 mL | INTRAVENOUS | Status: AC | PRN
  Administered 2019-10-16: 3 mL via INTRAVENOUS
  Filled 2019-10-16: qty 10

## 2019-10-16 MED ORDER — HEPARIN (PORCINE) 25000 UT/250ML-% IV SOLN
1250.0000 [IU]/h | INTRAVENOUS | Status: DC
  Administered 2019-10-16: 1400 [IU]/h via INTRAVENOUS
  Filled 2019-10-16 (×2): qty 250

## 2019-10-16 MED ORDER — ACETAMINOPHEN 325 MG PO TABS: 650.0000 mg | ORAL_TABLET | ORAL | Status: DC | PRN

## 2019-10-16 MED ORDER — MIDAZOLAM HCL 2 MG/2ML IJ SOLN
INTRAMUSCULAR | Status: AC
  Filled 2019-10-16: qty 2

## 2019-10-16 MED ORDER — HEPARIN (PORCINE) IN NACL 1000-0.9 UT/500ML-% IV SOLN
INTRAVENOUS | Status: DC | PRN
  Administered 2019-10-16 (×2): 500 mL

## 2019-10-16 MED ORDER — LIDOCAINE HCL (PF) 1 % IJ SOLN
INTRAMUSCULAR | Status: AC
  Filled 2019-10-16: qty 30

## 2019-10-16 MED ORDER — SODIUM CHLORIDE 0.9% FLUSH: 3.0000 mL | Freq: Two times a day (BID) | INTRAVENOUS | Status: DC

## 2019-10-16 MED ORDER — SODIUM CHLORIDE 0.9% FLUSH: 3.0000 mL | INTRAVENOUS | Status: DC | PRN

## 2019-10-16 MED ORDER — LABETALOL HCL 5 MG/ML IV SOLN: 10.0000 mg | INTRAVENOUS | Status: AC | PRN

## 2019-10-16 MED ORDER — PANTOPRAZOLE SODIUM 40 MG PO TBEC
40.0000 mg | DELAYED_RELEASE_TABLET | Freq: Every day | ORAL | Status: DC
  Administered 2019-10-17: 40 mg via ORAL
  Filled 2019-10-16: qty 1

## 2019-10-16 MED ORDER — HEPARIN SODIUM (PORCINE) 1000 UNIT/ML IJ SOLN
INTRAMUSCULAR | Status: DC | PRN
  Administered 2019-10-16: 5000 [IU] via INTRAVENOUS

## 2019-10-16 MED ORDER — SODIUM CHLORIDE 0.9 % WEIGHT BASED INFUSION
3.0000 mL/kg/h | INTRAVENOUS | Status: DC
  Administered 2019-10-16: 3 mL/kg/h via INTRAVENOUS

## 2019-10-16 MED ORDER — NITROGLYCERIN 0.4 MG SL SUBL: 0.4000 mg | SUBLINGUAL_TABLET | SUBLINGUAL | Status: DC | PRN

## 2019-10-16 MED ORDER — LOSARTAN POTASSIUM 50 MG PO TABS: 50.0000 mg | ORAL_TABLET | Freq: Every day | ORAL | Status: DC

## 2019-10-16 MED ORDER — SODIUM CHLORIDE 0.9 % IV SOLN: INTRAVENOUS | Status: AC

## 2019-10-16 MED ORDER — FENTANYL CITRATE (PF) 100 MCG/2ML IJ SOLN
INTRAMUSCULAR | Status: AC
  Filled 2019-10-16: qty 2

## 2019-10-16 SURGICAL SUPPLY — 9 items
CATH OPTITORQUE TIG 4.0 5F (CATHETERS) ×2 IMPLANT
DEVICE RAD COMP TR BAND LRG (VASCULAR PRODUCTS) ×2 IMPLANT
GLIDESHEATH SLEND SS 6F .021 (SHEATH) ×2 IMPLANT
GUIDEWIRE INQWIRE 1.5J.035X260 (WIRE) ×1 IMPLANT
INQWIRE 1.5J .035X260CM (WIRE) ×2
KIT HEART LEFT (KITS) ×2 IMPLANT
PACK CARDIAC CATHETERIZATION (CUSTOM PROCEDURE TRAY) ×2 IMPLANT
TRANSDUCER W/STOPCOCK (MISCELLANEOUS) ×2 IMPLANT
TUBING CIL FLEX 10 FLL-RA (TUBING) ×2 IMPLANT

## 2019-10-16 NOTE — Progress Notes (Signed)
  Echocardiogram 2D Echocardiogram has been performed.  Jose Bowers 10/16/2019, 5:33 PM

## 2019-10-16 NOTE — Brief Op Note (Signed)
   BRIEF CARDIAC CATHETERIZATION NOTE   10/16/2019; 1:53 PM  SURGEON:  Surgeon(s) and Role:    * Leonie Man, MD - Primary  PATIENT:  Jose Bowers  69 y.o. male with a history of factor V Leiden deficiency (prior DVT and saddle PE on lifelong Xarelto) along with hypertension, hyperlipidemia with significant family history for severe CAD (father died of MI at 36, and brother had CABG in his 39s).  He was referred to Dr. Gardiner Rhyme by Dr. Deforest Hoyles for evaluation of chest pain on September 19, 2018.  He was noting a burning lower chest and upper abdomen pain occurring daily with exertion but only after eating. He was evaluated Myoview stress test which suggested apical anteroseptal ischemia -> significant associated EKG changes made this a high risk test.  PRE-OPERATIVE DIAGNOSIS:  positive stress test  POST-OPERATIVE DIAGNOSIS:  Severe heavily calcified multivessel disease:  Heavily calcified proximal LAD subtotal occlusion (99%) lesion beginning just prior to major SP trunk, terminating after 1st Diag --> remainder the LAD is relatively free of disease with TIMI I flow and competitive flow from right left collaterals to the apex  Heavily calcified very focal 90% eccentric stenosis in the proximal LCx--LCx gives off 2 major Mrg branches and a posterior AV groove branch with 2 small PL branches--free of disease.  Heavily calcified dominant RCA with diffuse 30% mid to distal disease, and 80% focal stenosis just prior to RPAV continuing into the RPDA with 90% ostial PDA lesion.  Mildly reduced LVEF of roughly 45% with mid to apical anterior hypokinesis  PROCEDURE:  Procedure(s): LEFT HEART CATH AND CORONARY ANGIOGRAPHY (N/A) Time Out: Verified patient identification, verified procedure, site/side was marked, verified correct patient position, special equipment/implants available, medications/allergies/relevent history reviewed, required imaging and test results available. Performed.  Access:    Right Radial Artery: 6 Fr sheath -- Seldinger technique using Micropuncture Kit -- Direct ultrasound guidance used.  Permanent image obtained and placed on chart. -- 10 mL radial cocktail IA; 5000 Units IV Heparin  Left Heart Catheterization: 5Fr TIG 4.0 Catheters advanced or exchanged over a J-wire under direct fluoroscopic guidance into the ascending aorta; the catheter advanced across the aortic valve first, then pulled back for by gradient and groin angiography. * LV Hemodynamics (LV Gram): TIG 4.0 Catheter * Left & Right Coronary Artery Cineangiography: TIG 4.0  Catheter   Review of initial angiography revealed: Severe multivessel disease as noted  Preparations are made for termination of procedure to allow for admission to discuss options of CABG versus multivessel high risk PCI (likely atherectomy of LAD and LCx).    Upon completion of Angiogaphy, the catheter was removed completely out of the body over a wire, without complication.  Radial sheath removed in the Cardiac Catheterization lab with TR Band placed for hemostasis.  TR Band: 1240  Hours; 10 mL air  MEDICATIONS SQ Lidocaine 3 mL Radial Cocktail: 3 mg Verapmil in 10 mL NS Heparin: 5000 units  EBL:  <50 mL  COUNTS:  YES   DICTATION: .Note written in EPIC  PLAN OF CARE:   Admit under Dr. Newman Nickels service for medication optimization and CVTS consultation  He will be started on IV heparin while we continue to hold his Xarelto.  PATIENT DISPOSITION:  PACU - hemodynamically stable.   Delay start of Pharmacological VTE agent (>24hrs) due to surgical blood loss or risk of bleeding: not applicable

## 2019-10-16 NOTE — Progress Notes (Signed)
ANTICOAGULATION CONSULT NOTE - Initial Consult  Pharmacy Consult for heparin Indication: history of FVL with prior DVT/PE on xarelto lifelong, MV CAD  No Known Allergies  Patient Measurements: Height: 5\' 10"  (177.8 cm) Weight: 97.5 kg (215 lb) IBW/kg (Calculated) : 73 Heparin Dosing Weight: 93kg  Vital Signs: Temp: 97.8 F (36.6 C) (08/03 0958) Temp Source: Oral (08/03 0958) BP: 124/69 (08/03 1425) Pulse Rate: 75 (08/03 1425)  Labs: No results for input(s): HGB, HCT, PLT, APTT, LABPROT, INR, HEPARINUNFRC, HEPRLOWMOCWT, CREATININE, CKTOTAL, CKMB, TROPONINIHS in the last 72 hours.  Estimated Creatinine Clearance: 89.6 mL/min (by C-G formula based on SCr of 0.95 mg/dL).   Medical History: Past Medical History:  Diagnosis Date  . Factor V Leiden ([place])   . Hypertension    Assessment: 69 year old male with history of FVL/VTE on chronic xarelto. Patient s/p cath this afternoon with multivessel disease. Orders received to transition to IV heparin and hold xarelto while options are being reviewed which could include surgery.   No bleeding or hematoma noted post cath.   Goal of Therapy:  Heparin level 0.3-0.7 units/ml aPTT 66-102 seconds Monitor platelets by anticoagulation protocol: Yes   Plan:  Start heparin infusion at 1400 units/hr Check anti-Xa level in 8 hours and daily while on heparin Continue to monitor H&H and platelets  Erin Hearing PharmD., BCPS Clinical Pharmacist 10/16/2019 2:44 PM

## 2019-10-16 NOTE — Interval H&P Note (Signed)
History and Physical Interval Note:  10/16/2019 12:30 PM  Jose Bowers  has presented today for surgery, with the diagnosis of "Anginal Chest Pain" with Positive Stress Test.  The various methods of treatment have been discussed with the patient and family. After consideration of risks, benefits and other options for treatment, the patient has consented to  Procedure(s): LEFT HEART CATH AND CORONARY ANGIOGRAPHY (N/A) as a surgical intervention.  The patient's history has been reviewed, patient examined, no change in status, stable for surgery.  I have reviewed the patient's chart and labs.  Questions were answered to the patient's satisfaction.    Cath Lab Visit (complete for each Cath Lab visit)  Clinical Evaluation Leading to the Procedure:   ACS: No.  Non-ACS:    Anginal Classification: CCS III  Anti-ischemic medical therapy: Minimal Therapy (1 class of medications)  Non-Invasive Test Results: High-risk stress test findings: cardiac mortality >3%/year  Prior CABG: No previous CABG   Glenetta Hew

## 2019-10-16 NOTE — Consult Note (Signed)
[address]       [place] [address]             [phone]      Cardiothoracic Surgery Consultation  Reason for Consult: Severe multivessel coronary disease Referring Physician: Dr. Glenetta Hew  Jose Bowers is an 69 y.o. male.  HPI:   The patient is a 69 year old gentleman with a history of hypertension, hyperlipidemia, factor V Leiden deficiency status post DVT and saddle pulmonary embolus in 2019 on chronic Xarelto, and a strong family history of heart disease who presented with a history of upper abdominal and lower chest burning discomfort that he thought was GERD.  It has occurred every day usually with exertion but only after he has eaten.  He took proton pump inhibitors with no improvement.  The discomfort resolved with rest.  He has had some exertional shortness of breath and fatigue.  He had an exercise Myoview on 10/04/2019 but did not reach target heart rate on the treadmill and was converted to a pharmacologic study.  He did not reach the target heart rate but had 1.5 mm of horizontal ST depression in the inferior and anterolateral leads with chest pain symptoms.  There was a reversible defect in the apical anterior wall and apex consistent with LAD ischemia.  Ejection fraction was 49%.  He subsequent underwent cardiac catheterization today showing severe three-vessel coronary disease with 99% heavily calcified proximal LAD stenosis.  There is also a heavily calcified 90% stenosis in the proximal left circumflex.  There is an 80% stenosis at the bifurcation into the posterior descending and posterior lateral branches with 90% stenosis of the ostium of the PDA.  LVEF was estimated 45% with mid apical anterior hypokinesis.  He is a non-smoker.  He has family history of heart disease with his father dying of suspected MI in his 21s and a brother who had CABG in his 50s.  His wife is here with him today.    Past Medical History:  Diagnosis Date  .  Factor V Leiden ([place])   . Hypertension     Past Surgical History:  Procedure Laterality Date  . HERNIA REPAIR    . IR ANGIOGRAM PULMONARY BILATERAL SELECTIVE  03/19/2017  . IR ANGIOGRAM SELECTIVE EACH ADDITIONAL VESSEL  03/19/2017  . IR ANGIOGRAM SELECTIVE EACH ADDITIONAL VESSEL  03/19/2017  . IR INFUSION THROMBOL ARTERIAL INITIAL (MS)  03/19/2017  . IR INFUSION THROMBOL ARTERIAL INITIAL (MS)  03/19/2017  . IR THROMB F/U EVAL ART/VEN FINAL DAY (MS)  03/20/2017  . IR US GUIDE VASC ACCESS RIGHT  03/19/2017  . JOINT REPLACEMENT    . ROTATOR CUFF REPAIR      Family History  Problem Relation Age of Onset  . Clotting disorder Father   . Clotting disorder Brother   . Clotting disorder Son   . Clotting disorder Grandchild     Social History:  reports that he has never smoked. He has never used smokeless tobacco. He reports current alcohol use. He reports that he does not use drugs.  Allergies: No Known Allergies  Medications:  I have reviewed the patient's current medications. Prior to Admission:  Medications Prior to Admission  Medication Sig Dispense Refill Last Dose  . amoxicillin (AMOXIL) 500 MG capsule Take 2,000 mg by mouth See admin instructions. Take 2000 mg by mouth prior to any dental work done.   10/15/2019 at Unknown time  . atorvastatin (LIPITOR) 10 MG tablet Take 20 mg  by mouth daily.     Marland Kitchen atorvastatin (LIPITOR) 20 MG tablet Take 1 tablet (20 mg total) by mouth daily. 90 tablet 3 10/16/2019 at Unknown time  . 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     Marland Kitchen losartan-hydrochlorothiazide (HYZAAR) 50-12.5 MG tablet Take 1 tablet by mouth daily.  11 10/15/2019 at Unknown time  . pantoprazole (PROTONIX) 40 MG tablet Take 40 mg by mouth daily.   10/16/2019 at Unknown time  . XARELTO 20 MG TABS tablet Take 20 mg by mouth daily.   10/13/2019  . nitroGLYCERIN (NITROSTAT) 0.4 MG SL tablet Place 1 tablet (0.4 mg total) under the  tongue every 5 (five) minutes as needed for chest pain. 25 tablet 3    Scheduled: . [START ON 10/17/2019] atorvastatin  80 mg Oral q1800  . [START ON 10/17/2019] losartan  50 mg Oral Daily   And  . [START ON 10/17/2019] hydrochlorothiazide  12.5 mg Oral Daily  . [START ON 10/17/2019] pantoprazole  40 mg Oral Daily  . sodium chloride flush  3 mL Intravenous Q12H   Continuous: . sodium chloride 100 mL/hr (10/16/19 1419)  . sodium chloride    . heparin     DVV:OHYWVP chloride, acetaminophen, hydrALAZINE, labetalol, nitroGLYCERIN, ondansetron (ZOFRAN) IV, sodium chloride flush Anti-infectives (From admission, onward)   Start     Dose/Rate Route Frequency Ordered Stop   10/16/19 1417  amoxicillin (AMOXIL) capsule 2,000 mg  Status:  Discontinued       Note to Pharmacy: Take 2000 mg by mouth prior to any dental work done.     2,000 mg Oral See admin instructions 10/16/19 1417 10/16/19 1443      No results found for this or any previous visit (from the past 48 hour(s)).  No results found.  Review of Systems  Constitutional: Positive for fatigue.  HENT: Negative.   Eyes: Negative.   Respiratory: Positive for shortness of breath.   Cardiovascular: Positive for chest pain. Negative for leg swelling.  Gastrointestinal: Positive for abdominal pain.  Genitourinary: Negative.   Musculoskeletal: Negative.   Allergic/Immunologic: Negative.   Neurological: Negative for dizziness and syncope.  Hematological: Negative.   Psychiatric/Behavioral: Negative.    Blood pressure (!) 131/107, pulse 69, temperature 98.8 F (37.1 C), temperature source Oral, resp. rate 17, height 5\' 10"  (1.778 m), weight 98.7 kg, SpO2 98 %. Physical Exam Constitutional:      Appearance: Normal appearance. He is obese.     Comments: BMI 31.2  HENT:     Head: Normocephalic and atraumatic.  Eyes:     Extraocular Movements: Extraocular movements intact.     Conjunctiva/sclera: Conjunctivae normal.     Pupils: Pupils are  equal, round, and reactive to light.  Neck:     Vascular: No carotid bruit.  Cardiovascular:     Rate and Rhythm: Normal rate and regular rhythm.     Pulses: Normal pulses.     Heart sounds: Normal heart sounds. No murmur heard.   Pulmonary:     Effort: Pulmonary effort is normal.     Breath sounds: Normal breath sounds.  Abdominal:     General: Abdomen is flat.     Palpations: Abdomen is soft.  Musculoskeletal:        General: No swelling. Normal range of motion.     Cervical back: Normal range of motion and neck supple.  Skin:    General: Skin is warm and dry.  Neurological:  General: No focal deficit present.     Mental Status: He is alert and oriented to person, place, and time.  Psychiatric:        Mood and Affect: Mood normal.        Behavior: Behavior normal.        Thought Content: Thought content normal.        Judgment: Judgment normal.    BRIEF CARDIAC CATHETERIZATION NOTE   10/16/2019; 1:53 PM  SURGEON:  Surgeon(s) and Role:    * Leonie Man, MD - Primary  PATIENT:  Jose Bowers  69 y.o. male with a history of factor V Leiden deficiency (prior DVT and saddle PE on lifelong Xarelto) along with hypertension, hyperlipidemia with significant family history for severe CAD (father died of MI at 75, and brother had CABG in his 7s).  He was referred to Dr. Gardiner Rhyme by Dr. Deforest Hoyles for evaluation of chest pain on September 19, 2018.  He was noting a burning lower chest and upper abdomen pain occurring daily with exertion but only after eating. He was evaluated Myoview stress test which suggested apical anteroseptal ischemia -> significant associated EKG changes made this a high risk test.  PRE-OPERATIVE DIAGNOSIS:  positive stress test  POST-OPERATIVE DIAGNOSIS:  Severe heavily calcified multivessel disease:  Heavily calcified proximal LAD subtotal occlusion (99%) lesion beginning just prior to major SP trunk, terminating after 1st Diag --> remainder the LAD  is relatively free of disease with TIMI I flow and competitive flow from right left collaterals to the apex  Heavily calcified very focal 90% eccentric stenosis in the proximal LCx--LCx gives off 2 major Mrg branches and a posterior AV groove branch with 2 small PL branches--free of disease.  Heavily calcified dominant RCA with diffuse 30% mid to distal disease, and 80% focal stenosis just prior to RPAV continuing into the RPDA with 90% ostial PDA lesion.  Mildly reduced LVEF of roughly 45% with mid to apical anterior hypokinesis  Assessment/Plan:  This 69 year old gentleman has severe three-vessel coronary disease with heavily calcified high-grade proximal LAD and left circumflex stenoses as well as high-grade stenosis of the distal right coronary artery at the bifurcation of the posterior descending and posterior lateral branches.  His left ventricular ejection fraction was estimated at 45% by catheterization.  Prior echocardiogram in March 2020 showed an ejection fraction of 55 to 60%.  I agree that coronary artery bypass graft surgery is the best treatment event further ischemia and infarction and improve his quality of life.  I reviewed the cardiac catheterization findings with the patient and his wife and answered their questions. I discussed the operative procedure with the patient and his wife including alternatives, benefits and risks; including but not limited to bleeding, blood transfusion, infection, stroke, myocardial infarction, graft failure, heart block requiring a permanent pacemaker, organ dysfunction, and death.  Jose Bowers understands and agrees to proceed.  We will schedule surgery for Thursday.  He should remain in the hospital on intravenous heparin due to his factor V deficiency and tight three-vessel coronary disease.   I spent 60 minutes performing this consultation and > 50% of this time was spent face to face counseling and coordinating the care of this patient's  severe three-vessel coronary disease.  Gaye Pollack 10/16/2019, 3:50 PM

## 2019-10-17 ENCOUNTER — Encounter (HOSPITAL_COMMUNITY): Payer: Self-pay | Admitting: Cardiology

## 2019-10-17 ENCOUNTER — Inpatient Hospital Stay (HOSPITAL_COMMUNITY): Payer: Medicare Other

## 2019-10-17 DIAGNOSIS — Z01810 Encounter for preprocedural cardiovascular examination: Secondary | ICD-10-CM

## 2019-10-17 DIAGNOSIS — D6851 Activated protein C resistance: Secondary | ICD-10-CM

## 2019-10-17 DIAGNOSIS — I25119 Atherosclerotic heart disease of native coronary artery with unspecified angina pectoris: Principal | ICD-10-CM

## 2019-10-17 DIAGNOSIS — E875 Hyperkalemia: Secondary | ICD-10-CM

## 2019-10-17 LAB — BLOOD GAS, ARTERIAL
Acid-Base Excess: 2.2 mmol/L — ABNORMAL HIGH (ref 0.0–2.0)
Bicarbonate: 26.3 mmol/L (ref 20.0–28.0)
Drawn by: 548791
FIO2: 21
O2 Saturation: 96.1 %
Patient temperature: 37
pCO2 arterial: 41.4 mmHg (ref 32.0–48.0)
pH, Arterial: 7.42 (ref 7.350–7.450)
pO2, Arterial: 80.7 mmHg — ABNORMAL LOW (ref 83.0–108.0)

## 2019-10-17 LAB — PROTIME-INR
INR: 1 (ref 0.8–1.2)
Prothrombin Time: 13.2 seconds (ref 11.4–15.2)

## 2019-10-17 LAB — HEMOGLOBIN A1C
Hgb A1c MFr Bld: 5.9 % — ABNORMAL HIGH (ref 4.8–5.6)
Mean Plasma Glucose: 122.63 mg/dL

## 2019-10-17 LAB — BASIC METABOLIC PANEL
Anion gap: 13 (ref 5–15)
Anion gap: 7 (ref 5–15)
BUN: 13 mg/dL (ref 8–23)
BUN: 10 mg/dL (ref 8–23)
CO2: 20 mmol/L — ABNORMAL LOW (ref 22–32)
CO2: 28 mmol/L (ref 22–32)
Calcium: 8.6 mg/dL — ABNORMAL LOW (ref 8.9–10.3)
Calcium: 9 mg/dL (ref 8.9–10.3)
Chloride: 106 mmol/L (ref 98–111)
Chloride: 104 mmol/L (ref 98–111)
Creatinine, Ser: 0.97 mg/dL (ref 0.61–1.24)
Creatinine, Ser: 0.97 mg/dL (ref 0.61–1.24)
GFR calc Af Amer: >60 mL/min (ref >60)
GFR calc Af Amer: >60 mL/min (ref >60)
GFR calc non Af Amer: >60 mL/min (ref >60)
GFR calc non Af Amer: >60 mL/min (ref >60)
Glucose, Bld: 102 mg/dL — ABNORMAL HIGH (ref 70–99)
Glucose, Bld: 98 mg/dL (ref 70–99)
Potassium: 5.8 mmol/L — ABNORMAL HIGH (ref 3.5–5.1)
Potassium: 4 mmol/L (ref 3.5–5.1)
Sodium: 139 mmol/L (ref 135–145)
Sodium: 139 mmol/L (ref 135–145)

## 2019-10-17 LAB — HEPARIN LEVEL (UNFRACTIONATED)
Heparin Unfractionated: 0.17 IU/mL — ABNORMAL LOW (ref 0.30–0.70)
Heparin Unfractionated: 0.79 IU/mL — ABNORMAL HIGH (ref 0.30–0.70)
Heparin Unfractionated: 0.88 IU/mL — ABNORMAL HIGH (ref 0.30–0.70)

## 2019-10-17 LAB — CBC
HCT: 46 % (ref 39.0–52.0)
Hemoglobin: 14.8 g/dL (ref 13.0–17.0)
MCH: 30.6 pg (ref 26.0–34.0)
MCHC: 32.2 g/dL (ref 30.0–36.0)
MCV: 95.2 fL (ref 80.0–100.0)
Platelets: 215 10*3/uL (ref 150–400)
RBC: 4.83 MIL/uL (ref 4.22–5.81)
RDW: 12.8 % (ref 11.5–15.5)
WBC: 6.6 10*3/uL (ref 4.0–10.5)
nRBC: 0 % (ref 0.0–0.2)

## 2019-10-17 LAB — PULMONARY FUNCTION TEST
FEF 25-75 Pre: 2.27 L/sec
FEF2575-%Pred-Pre: 84 %
FEV1-%Pred-Pre: 76 %
FEV1-Pre: 2.6 L
FEV1FVC-%Pred-Pre: 96 %
FEV6-%Pred-Pre: 80 %
FEV6-Pre: 3.47 L
FEV6FVC-%Pred-Pre: 103 %
FVC-%Pred-Pre: 78 %
FVC-Pre: 3.61 L
Pre FEV1/FVC ratio: 72 %
Pre FEV6/FVC Ratio: 98 %

## 2019-10-17 LAB — SURGICAL PCR SCREEN
MRSA, PCR: NEGATIVE
Staphylococcus aureus: NEGATIVE

## 2019-10-17 LAB — TYPE AND SCREEN
ABO/RH(D): A NEG
Antibody Screen: NEGATIVE

## 2019-10-17 LAB — URINALYSIS, ROUTINE W REFLEX MICROSCOPIC
Bilirubin Urine: NEGATIVE
Glucose, UA: NEGATIVE mg/dL
Hgb urine dipstick: NEGATIVE
Ketones, ur: NEGATIVE mg/dL
Leukocytes,Ua: NEGATIVE
Nitrite: NEGATIVE
Protein, ur: NEGATIVE mg/dL
Specific Gravity, Urine: 1.009 (ref 1.005–1.030)
pH: 7 (ref 5.0–8.0)

## 2019-10-17 LAB — APTT
aPTT: 46 seconds — ABNORMAL HIGH (ref 24–36)
aPTT: 99 seconds — ABNORMAL HIGH (ref 24–36)

## 2019-10-17 LAB — ABO/RH: ABO/RH(D): A NEG

## 2019-10-17 MED ORDER — METOPROLOL TARTRATE 12.5 MG HALF TABLET
12.5000 mg | ORAL_TABLET | Freq: Once | ORAL | Status: AC
  Administered 2019-10-18: 12.5 mg via ORAL
  Filled 2019-10-17: qty 1

## 2019-10-17 MED ORDER — DEXMEDETOMIDINE HCL IN NACL 400 MCG/100ML IV SOLN
0.1000 ug/kg/h | INTRAVENOUS | Status: AC
  Administered 2019-10-18: .5 ug/kg/h via INTRAVENOUS
  Filled 2019-10-17: qty 100

## 2019-10-17 MED ORDER — HYDROCHLOROTHIAZIDE 25 MG PO TABS
25.0000 mg | ORAL_TABLET | Freq: Every day | ORAL | Status: DC
  Administered 2019-10-17: 25 mg via ORAL
  Filled 2019-10-17: qty 1

## 2019-10-17 MED ORDER — NITROGLYCERIN IN D5W 200-5 MCG/ML-% IV SOLN
2.0000 ug/min | INTRAVENOUS | Status: AC
  Administered 2019-10-18: 33.33 ug/min via INTRAVENOUS
  Filled 2019-10-17: qty 250

## 2019-10-17 MED ORDER — POTASSIUM CHLORIDE 2 MEQ/ML IV SOLN
80.0000 meq | INTRAVENOUS | Status: DC
  Filled 2019-10-17: qty 40

## 2019-10-17 MED ORDER — SODIUM CHLORIDE 0.9 % IV SOLN
INTRAVENOUS | Status: DC
  Filled 2019-10-17: qty 30

## 2019-10-17 MED ORDER — TRANEXAMIC ACID 1000 MG/10ML IV SOLN
1.5000 mg/kg/h | INTRAVENOUS | Status: AC
  Administered 2019-10-18: 1.5 mg/kg/h via INTRAVENOUS
  Filled 2019-10-17: qty 25

## 2019-10-17 MED ORDER — CHLORHEXIDINE GLUCONATE 0.12 % MT SOLN
15.0000 mL | Freq: Once | OROMUCOSAL | Status: AC
  Administered 2019-10-18: 15 mL via OROMUCOSAL
  Filled 2019-10-17: qty 15

## 2019-10-17 MED ORDER — DIAZEPAM 5 MG PO TABS
5.0000 mg | ORAL_TABLET | Freq: Once | ORAL | Status: AC
  Administered 2019-10-18: 5 mg via ORAL
  Filled 2019-10-17: qty 1

## 2019-10-17 MED ORDER — SODIUM CHLORIDE 0.9 % IV SOLN
1.5000 g | INTRAVENOUS | Status: AC
  Administered 2019-10-18: 1.5 g via INTRAVENOUS
  Filled 2019-10-17: qty 1.5

## 2019-10-17 MED ORDER — NOREPINEPHRINE 4 MG/250ML-% IV SOLN
0.0000 ug/min | INTRAVENOUS | Status: DC
  Filled 2019-10-17: qty 250

## 2019-10-17 MED ORDER — BISACODYL 5 MG PO TBEC
5.0000 mg | DELAYED_RELEASE_TABLET | Freq: Once | ORAL | Status: AC
  Administered 2019-10-17: 5 mg via ORAL
  Filled 2019-10-17: qty 1

## 2019-10-17 MED ORDER — INSULIN REGULAR(HUMAN) IN NACL 100-0.9 UT/100ML-% IV SOLN
INTRAVENOUS | Status: AC
  Administered 2019-10-18: .8 [IU]/h via INTRAVENOUS
  Filled 2019-10-17: qty 100

## 2019-10-17 MED ORDER — SODIUM CHLORIDE 0.9 % IV SOLN
750.0000 mg | INTRAVENOUS | Status: AC
  Administered 2019-10-18: 750 mg via INTRAVENOUS
  Filled 2019-10-17: qty 750

## 2019-10-17 MED ORDER — VANCOMYCIN HCL 1500 MG/300ML IV SOLN
1500.0000 mg | INTRAVENOUS | Status: AC
  Administered 2019-10-18: 1500 mg via INTRAVENOUS
  Filled 2019-10-17: qty 300

## 2019-10-17 MED ORDER — SODIUM ZIRCONIUM CYCLOSILICATE 10 G PO PACK
10.0000 g | PACK | Freq: Once | ORAL | Status: AC
  Administered 2019-10-17: 10 g via ORAL
  Filled 2019-10-17: qty 1

## 2019-10-17 MED ORDER — TRANEXAMIC ACID (OHS) PUMP PRIME SOLUTION
2.0000 mg/kg | INTRAVENOUS | Status: DC
  Filled 2019-10-17: qty 1.96

## 2019-10-17 MED ORDER — MAGNESIUM SULFATE 50 % IJ SOLN
40.0000 meq | INTRAMUSCULAR | Status: DC
  Filled 2019-10-17: qty 9.85

## 2019-10-17 MED ORDER — PHENYLEPHRINE HCL-NACL 20-0.9 MG/250ML-% IV SOLN
30.0000 ug/min | INTRAVENOUS | Status: AC
  Administered 2019-10-18: 20 ug/min via INTRAVENOUS
  Filled 2019-10-17: qty 250

## 2019-10-17 MED ORDER — TRANEXAMIC ACID (OHS) BOLUS VIA INFUSION
15.0000 mg/kg | INTRAVENOUS | Status: AC
  Administered 2019-10-18: 1473 mg via INTRAVENOUS
  Filled 2019-10-17: qty 1473

## 2019-10-17 MED ORDER — EPINEPHRINE HCL 5 MG/250ML IV SOLN IN NS
0.0000 ug/min | INTRAVENOUS | Status: DC
  Filled 2019-10-17: qty 250

## 2019-10-17 MED ORDER — CHLORHEXIDINE GLUCONATE CLOTH 2 % EX PADS
6.0000 | MEDICATED_PAD | Freq: Once | CUTANEOUS | Status: AC
  Administered 2019-10-18: 6 via TOPICAL

## 2019-10-17 MED ORDER — CHLORHEXIDINE GLUCONATE CLOTH 2 % EX PADS
6.0000 | MEDICATED_PAD | Freq: Once | CUTANEOUS | Status: AC
  Administered 2019-10-17: 6 via TOPICAL

## 2019-10-17 MED ORDER — TEMAZEPAM 15 MG PO CAPS: 15.0000 mg | ORAL_CAPSULE | Freq: Once | ORAL | Status: DC | PRN

## 2019-10-17 MED ORDER — MILRINONE LACTATE IN DEXTROSE 20-5 MG/100ML-% IV SOLN
0.3000 ug/kg/min | INTRAVENOUS | Status: DC
  Filled 2019-10-17: qty 100

## 2019-10-17 MED ORDER — PLASMA-LYTE 148 IV SOLN
INTRAVENOUS | Status: DC
  Filled 2019-10-17: qty 2.5

## 2019-10-17 NOTE — Progress Notes (Signed)
[place] for Heparin Indication: history of FVL with prior DVT/PE on xarelto lifelong, MV CAD  No Known Allergies  Patient Measurements: Height: 5\' 10"  (177.8 cm) Weight: 98.2 kg (216 lb 8.2 oz) IBW/kg (Calculated) : 73 Heparin Dosing Weight: 93kg  Vital Signs: Temp: 99 F (37.2 C) (08/04 1200) Temp Source: Oral (08/04 1200) BP: 138/76 (08/04 1200) Pulse Rate: 71 (08/04 1200)  Labs: Recent Labs    10/17/19 0432 10/17/19 1316  HGB 14.8  --   HCT 46.0  --   PLT 215  --   APTT 46* 99*  LABPROT  --  13.2  INR  --  1.0  HEPARINUNFRC 0.17* 0.79*  CREATININE 0.97 0.97    Estimated Creatinine Clearance: 88 mL/min (by C-G formula based on SCr of 0.97 mg/dL).   Medical History: Past Medical History:  Diagnosis Date  . Factor V Leiden ([place])   . Hypertension    Assessment: 69 year old male with history of FVL/VTE on chronic xarelto. Patient s/p cath  with multivessel disease.  Pharmacy dosing IV heparin. Plans noted for CABG on 8/5 -heparin level above goal after increase to 1600 units/hr   Goal of Therapy:  Heparin level 0.3-0.7 units/mL Monitor platelets by anticoagulation protocol: Yes   Plan:  -Decrease heparin to 1500 units/hr -Heparin level in 6 hours and daily wth CBC daily  Hildred Laser, PharmD Clinical Pharmacist **Pharmacist phone directory can now be found on [email or web address] ([email or web address]).  Listed under [place].

## 2019-10-17 NOTE — Progress Notes (Addendum)
Progress Note  Patient Name: Jose Bowers Date of Encounter: 10/17/2019  [facility] Cardiologist: Donato Heinz, MD   Subjective   Cath showed severe multivessel disease. Plan for CABG tomorrow. Patient denies chest pain or sob.   Inpatient Medications    Scheduled Meds: . atorvastatin  80 mg Oral q1800  . [START ON 10/18/2019] epinephrine  0-10 mcg/min Intravenous To OR  . [START ON 10/18/2019] heparin-papaverine-plasmalyte irrigation   Irrigation To OR  . losartan  50 mg Oral Daily   And  . hydrochlorothiazide  12.5 mg Oral Daily  . [START ON 10/18/2019] insulin   Intravenous To OR  . [START ON 10/18/2019] magnesium sulfate  40 mEq Other To OR  . pantoprazole  40 mg Oral Daily  . [START ON 10/18/2019] phenylephrine  30-200 mcg/min Intravenous To OR  . [START ON 10/18/2019] potassium chloride  80 mEq Other To OR  . sodium chloride flush  3 mL Intravenous Q12H  . [START ON 10/18/2019] tranexamic acid  15 mg/kg Intravenous To OR  . [START ON 10/18/2019] tranexamic acid  2 mg/kg Intracatheter To OR   Continuous Infusions: . sodium chloride    . [START ON 10/18/2019] cefUROXime (ZINACEF)  IV    . [START ON 10/18/2019] cefUROXime (ZINACEF)  IV    . [START ON 10/18/2019] dexmedetomidine    . [START ON 10/18/2019] heparin 30,000 units/NS 1000 mL solution for CELLSAVER    . heparin 1,600 Units/hr (10/17/19 0558)  . [START ON 10/18/2019] milrinone    . [START ON 10/18/2019] nitroGLYCERIN    . [START ON 10/18/2019] norepinephrine    . [START ON 10/18/2019] tranexamic acid (CYKLOKAPRON) infusion ([facility])    . [START ON 10/18/2019] vancomycin     PRN Meds: sodium chloride, acetaminophen, nitroGLYCERIN, ondansetron (ZOFRAN) IV, sodium chloride flush   Vital Signs    Vitals:   10/16/19 1600 10/16/19 1927 10/17/19 0010 10/17/19 0444  BP: 132/79 126/86 111/69 121/81  Pulse: 79 73 61 73  Resp:  18 17 16   Temp:  98.5 F (36.9 C) 98.1 F (36.7 C) 98.3 F (36.8 C)  TempSrc:  Oral Axillary Oral    SpO2: 98% 95% 95% 97%  Weight:    98.2 kg  Height:        Intake/Output Summary (Last 24 hours) at 10/17/2019 0751 Last data filed at 10/17/2019 0558 Gross per 24 hour  Intake 635.38 ml  Output 300 ml  Net 335.38 ml   Last 3 Weights 10/17/2019 10/16/2019 10/16/2019  Weight (lbs) 216 lb 8.2 oz 217 lb 8 oz 215 lb  Weight (kg) 98.21 kg 98.657 kg 97.523 kg      Telemetry    NSR, HR in the 60s, intermittent bradycardia to upper 40s overnight - Personally Reviewed  ECG    No new - Personally Reviewed  Physical Exam   GEN: No acute distress.   Neck: No JVD Cardiac: RRR, no murmurs, rubs, or gallops.  Respiratory: Clear to auscultation bilaterally. GI: Soft, nontender, non-distended  MS: No edema; No deformity. Neuro:  Nonfocal  Psych: Normal affect   Labs    High Sensitivity Troponin:  No results for input(s): TROPONINIHS in the last 720 hours.    Chemistry Recent Labs  Lab 10/17/19 0432  NA 139  K 5.8*  CL 106  CO2 20*  GLUCOSE 102*  BUN 13  CREATININE 0.97  CALCIUM 8.6*  GFRNONAA >60  GFRAA >60  ANIONGAP 13     Hematology Recent Labs  Lab 10/17/19 0432  WBC 6.6  RBC 4.83  HGB 14.8  HCT 46.0  MCV 95.2  MCH 30.6  MCHC 32.2  RDW 12.8  PLT 215    BNPNo results for input(s): BNP, PROBNP in the last 168 hours.   DDimer No results for input(s): DDIMER in the last 168 hours.   Radiology    CARDIAC CATHETERIZATION  Result Date: 10/16/2019  -- LEFT VENTRICULAR HEMODYNAMICS & VENTRICULOGRAPHY  There is mild left ventricular systolic dysfunction.  LV end diastolic pressure is normal.  The left ventricular ejection fraction is 45-50% by visual estimate.  -- CORONARY ANGIOGRAPHY  Prox LAD to Mid LAD lesion is 99% stenosed with 50% stenosed side branch in 1st Diag.  Prox Cx lesion is 90% stenosed.  Dist RCA lesion is 80% stenosed with 90% stenosed side branch in RPDA.  SUMMARY  Severe heavily calcified multivessel disease:  Heavily calcified proximal LAD  subtotal occlusion (99%) lesion beginning just prior to major SP trunk, terminating after 1st Diag --> remainder the LAD is relatively free of disease with TIMI I flow and competitive flow from right left collaterals to the apex  Heavily calcified very focal 90% eccentric stenosis in the proximal LCx--LCx gives off 2 major Mrg branches and a posterior AV groove branch with 2 small PL branches--free of disease.  Heavily calcified dominant RCA with diffuse 30% mid to distal disease, and 80% focal stenosis just prior to RPAV continuing into the RPDA with 90% ostial PDA lesion.  Mildly reduced LVEF of roughly 45% with mid to apical anterior hypokinesis RECOMMENDATION  Admit under Dr. Newman Nickels service for medication optimization and CVTS consultation  He will be started on IV Heparin while we continue to hold his Xarelto.  Increase to Max dose Atorvastatin & adjust BP medications Glenetta Hew, MD   ECHOCARDIOGRAM COMPLETE  Result Date: 10/16/2019    ECHOCARDIOGRAM REPORT   Patient Name:   Jose Bowers Date of Exam: 10/16/2019 Medical Rec #:  [id number]        Height:       70.0 in Accession #:    [id number]       Weight:       217.5 lb Date of Birth:  11/03/1950        BSA:          2.163 m Patient Age:    69 years         BP:           132/79 mmHg Patient Gender: M                HR:           79 bpm. Exam Location:  Inpatient Procedure: 2D Echo, Intracardiac Opacification Agent, Cardiac Doppler and Color            Doppler Indications:    Coronary artery disease (CAD)  History:        Patient has prior history of Echocardiogram examinations, most                 recent 05/19/2018. CAD and Angina; Risk Factors:Hypertension and                 Dyslipidemia. H/o DVT and PE.  Sonographer:    Clayton Lefort RDCS (AE) Referring Phys: [id number] Gaye Pollack  Sonographer Comments: Technically difficult study due to poor echo windows and patient is morbidly obese. Image acquisition challenging due to patient body habitus.  IMPRESSIONS  1.  Left ventricular ejection fraction, by estimation, is 55 to 60%. The left ventricle has normal function. The left ventricle has no regional wall motion abnormalities. There is mild left ventricular hypertrophy of the basal-septal segment. Left ventricular diastolic parameters are consistent with Grade I diastolic dysfunction (impaired relaxation).  2. Right ventricular systolic function is normal. The right ventricular size is normal. Tricuspid regurgitation signal is inadequate for assessing PA pressure.  3. Left atrial size was mildly dilated.  4. The mitral valve is normal in structure. No evidence of mitral valve regurgitation. No evidence of mitral stenosis.  5. The aortic valve is normal in structure. Aortic valve regurgitation is not visualized. No aortic stenosis is present.  6. Aortic dilatation noted. There is borderline dilatation of the aortic root measuring 38 mm.  7. The inferior vena cava is normal in size with greater than 50% respiratory variability, suggesting right atrial pressure of 3 mmHg. Comparison(s): No significant change from prior study. Prior images reviewed side by side. FINDINGS  Left Ventricle: Left ventricular ejection fraction, by estimation, is 55 to 60%. The left ventricle has normal function. The left ventricle has no regional wall motion abnormalities. Definity contrast agent was given IV to delineate the left ventricular  endocardial borders. The left ventricular internal cavity size was normal in size. There is mild left ventricular hypertrophy of the basal-septal segment. Left ventricular diastolic parameters are consistent with Grade I diastolic dysfunction (impaired relaxation). Normal left ventricular filling pressure. Right Ventricle: The right ventricular size is normal. No increase in right ventricular wall thickness. Right ventricular systolic function is normal. Tricuspid regurgitation signal is inadequate for assessing PA pressure. Left Atrium: Left  atrial size was mildly dilated. Right Atrium: Right atrial size was normal in size. Pericardium: There is no evidence of pericardial effusion. Mitral Valve: The mitral valve is normal in structure. Normal mobility of the mitral valve leaflets. No evidence of mitral valve regurgitation. No evidence of mitral valve stenosis. MV peak gradient, 2.6 mmHg. The mean mitral valve gradient is 1.0 mmHg. Tricuspid Valve: The tricuspid valve is normal in structure. Tricuspid valve regurgitation is not demonstrated. No evidence of tricuspid stenosis. Aortic Valve: The aortic valve is normal in structure. Aortic valve regurgitation is not visualized. No aortic stenosis is present. Aortic valve mean gradient measures 2.0 mmHg. Aortic valve peak gradient measures 3.9 mmHg. Aortic valve area, by VTI measures 3.19 cm. Pulmonic Valve: The pulmonic valve was normal in structure. Pulmonic valve regurgitation is not visualized. No evidence of pulmonic stenosis. Aorta: Aortic dilatation noted. There is borderline dilatation of the aortic root measuring 38 mm. Venous: The inferior vena cava is normal in size with greater than 50% respiratory variability, suggesting right atrial pressure of 3 mmHg. IAS/Shunts: No atrial level shunt detected by color flow Doppler.  LEFT VENTRICLE PLAX 2D LVIDd:         4.50 cm  Diastology LVIDs:         3.40 cm  LV e' lateral:   6.74 cm/s LV PW:         1.10 cm  LV E/e' lateral: 7.5 LV IVS:        1.30 cm  LV e' medial:    6.64 cm/s LVOT diam:     2.30 cm  LV E/e' medial:  7.6 LV SV:         58 LV SV Index:   27 LVOT Area:     4.15 cm  RIGHT VENTRICLE RV Basal diam:  3.30 cm  RV S prime:     14.40 cm/s TAPSE (M-mode): 2.3 cm LEFT ATRIUM             Index       RIGHT ATRIUM           Index LA diam:        4.30 cm 1.99 cm/m  RA Area:     17.60 cm LA Vol (A2C):   44.7 ml 20.67 ml/m RA Volume:   44.80 ml  20.71 ml/m LA Vol (A4C):   45.8 ml 21.17 ml/m LA Biplane Vol: 46.8 ml 21.64 ml/m  AORTIC VALVE AV  Area (Vmax):    3.03 cm AV Area (Vmean):   2.83 cm AV Area (VTI):     3.19 cm AV Vmax:           99.10 cm/s AV Vmean:          71.000 cm/s AV VTI:            0.181 m AV Peak Grad:      3.9 mmHg AV Mean Grad:      2.0 mmHg LVOT Vmax:         72.30 cm/s LVOT Vmean:        48.300 cm/s LVOT VTI:          0.139 m LVOT/AV VTI ratio: 0.77  AORTA Ao Root diam: 3.80 cm Ao Asc diam:  3.60 cm MITRAL VALVE MV Area (PHT): 2.45 cm    SHUNTS MV Peak grad:  2.6 mmHg    Systemic VTI:  0.14 m MV Mean grad:  1.0 mmHg    Systemic Diam: 2.30 cm MV Vmax:       0.81 m/s MV Vmean:      46.1 cm/s MV Decel Time: 310 msec MV E velocity: 50.60 cm/s MV A velocity: 81.00 cm/s MV E/A ratio:  0.62 Mihai Croitoru MD Electronically signed by Sanda Klein MD Signature Date/Time: 10/16/2019/5:50:02 PM    Final     Cardiac Studies   Cardiac cath 10/16/19  -- LEFT VENTRICULAR HEMODYNAMICS & VENTRICULOGRAPHY  There is mild left ventricular systolic dysfunction.  LV end diastolic pressure is normal.  The left ventricular ejection fraction is 45-50% by visual estimate.  -- CORONARY ANGIOGRAPHY  Prox LAD to Mid LAD lesion is 99% stenosed with 50% stenosed side branch in 1st Diag.  Prox Cx lesion is 90% stenosed.  Dist RCA lesion is 80% stenosed with 90% stenosed side branch in RPDA.   SUMMARY   Severe heavily calcified multivessel disease:  Heavily calcified proximal LAD subtotal occlusion (99%) lesion beginning just prior to major SP trunk, terminating after1st Diag -->remainder the LAD is relatively free of disease with TIMI I flow and competitive flow from right left collaterals to the apex  Heavily calcified very focal 90% eccentric stenosis in the proximal LCx--LCx gives off 2 major Mrgbranches and a posterior AV groove branch with 2 small PL branches--free of disease.  Heavily calcified dominant RCA with diffuse 30% mid to distal disease, and 80% focal stenosis just prior to RPAV continuing into the RPDA with 90%  ostial PDA lesion.  Mildly reduced LVEF of roughly 45% with mid to apical anterior hypokinesis  RECOMMENDATION  Admit under Dr. Lucienne Capers for medication optimization and CVTS consultation  He will be started on IV Heparin while we continue to hold his Xarelto.  Increase to Max dose Atorvastatin & adjust BP medications  Coronary Diagrams  Diagnostic Dominance: Right  Echo 10/16/19 1. Left ventricular ejection fraction, by estimation, is 55 to 60%. The  left ventricle has normal function. The left ventricle has no regional  wall motion abnormalities. There is mild left ventricular hypertrophy of  the basal-septal segment. Left  ventricular diastolic parameters are consistent with Grade I diastolic  dysfunction (impaired relaxation).  2. Right ventricular systolic function is normal. The right ventricular  size is normal. Tricuspid regurgitation signal is inadequate for assessing  PA pressure.  3. Left atrial size was mildly dilated.  4. The mitral valve is normal in structure. No evidence of mitral valve  regurgitation. No evidence of mitral stenosis.  5. The aortic valve is normal in structure. Aortic valve regurgitation is  not visualized. No aortic stenosis is present.  6. Aortic dilatation noted. There is borderline dilatation of the aortic  root measuring 38 mm.  7. The inferior vena cava is normal in size with greater than 50%  respiratory variability, suggesting right atrial pressure of 3 mmHg.   Comparison(s): No significant change from prior study. Prior images  reviewed side by side.   Patient Profile     69 y.o. 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   Assessment & Plan    Multivessel CAD - Cath 8/3 showed severe heavily calcified multivessel  disease with mildly reduced LVEF 45% with mid to apical hypokinesis>>CVTS consulted and planning for CABG - Echo showed LVEF 55-60%, no wall motion abnormalities, mild LVH, G1DD, mildly dilated LA, borderline dilation of aortic root - IV heparin for h/o of Factor 5 Leiden.  - Patient is chest pain free  Hypertension - Losartan-HCTZ 50-12.5mg  daily - BP tpday 121/81  HLD - Atorvastatin 80 mg daily - LDL 89  Factor V Leiden/H/o of DVT and PE - Xarelto held>> on IV heparin  Electrolyte abnormality - K+ high at 5.8 - No EKG changes - Will hold losartan, continue HCTZ, and give dose of Lokelma.   For questions or updates, please contact [place] Please consult [email or web address] for contact info under     Signed, Cadence Ninfa Meeker, PA-C  10/17/2019, 7:51 AM    Patient seen and examined.  Agree with above documentation.  On exam, patient is alert and oriented, regular rate and rhythm, no murmurs, lungs CTAB, no LE edema or JVD.  Telemetry personally reviewed and shows sinus rhythm with rate 60s to 80s.  Planning for CABG tomorrow.  Will continue IV heparin until CABG.  Echo showed normal LVEF.  Labs notable for elevated potassium (5.8) this morning.  EKG shows no changes.  Will hold home losartan, continue hydrochlorothiazide, and give dose of Lokelma.  Donato Heinz, MD

## 2019-10-17 NOTE — Progress Notes (Signed)
[facility] for Heparin Indication: history of FVL with prior DVT/PE on xarelto lifelong, MV CAD  No Known Allergies  Patient Measurements: Height: 5\' 10"  (177.8 cm) Weight: 98.2 kg (216 lb 8.2 oz) IBW/kg (Calculated) : 73 Heparin Dosing Weight: 93kg  Vital Signs: Temp: 98.3 F (36.8 C) (08/04 0444) Temp Source: Oral (08/04 0444) BP: 121/81 (08/04 0444) Pulse Rate: 73 (08/04 0444)  Labs: Recent Labs    10/17/19 0432  HGB 14.8  HCT 46.0  PLT 215  APTT 46*  HEPARINUNFRC 0.17*    Estimated Creatinine Clearance: 89.9 mL/min (by C-G formula based on SCr of 0.95 mg/dL).   Medical History: Past Medical History:  Diagnosis Date  . Factor V Leiden (Bernice)   . Hypertension    Assessment: 69 year old male with history of FVL/VTE on chronic xarelto. Patient s/p cath this afternoon with multivessel disease. Orders received to transition to IV heparin and hold xarelto while options are being reviewed which could include surgery.   No bleeding or hematoma noted post cath.   8/4 AM update:  Heparin level low No issues per RN Will DC further aPTTs  Goal of Therapy:  Heparin level 0.3-0.7 units/mL Monitor platelets by anticoagulation protocol: Yes   Plan:  Inc heparin to 1600 units/hr 1400 heparin level  Narda Bonds, PharmD, BCPS Clinical Pharmacist Phone: [phone]

## 2019-10-17 NOTE — Progress Notes (Signed)
CARDIAC REHAB PHASE I   PRE:  Rate/Rhythm: 85 SR    BP: sitting 140/83    SaO2: 97 RA  MODE:  Ambulation: 900 ft   POST:  Rate/Rhythm: 91 SR    BP: sitting 149/87     SaO2:   Tolerated well, no sx. Likes to mobilize. Discussed IS (2500+), sternal precautions, mobility post op, and d/c planning. Pt very receptive, very knowledgeable. Gave materials to review. [place], ACSM 10/17/2019 10:10 AM

## 2019-10-17 NOTE — Progress Notes (Signed)
ANTICOAGULATION CONSULT NOTE - Follow Up Consult  Pharmacy Consult for Heparin Indication: h/o VTE with clotting disorder + MV CAD  No Known Allergies  Patient Measurements: Height: 5\' 10"  (177.8 cm) Weight: 98.2 kg (216 lb 8.2 oz) IBW/kg (Calculated) : 73 Heparin Dosing Weight: 93.5 kg  Vital Signs: Temp: 98.5 F (36.9 C) (08/04 2011) Temp Source: Oral (08/04 2011) BP: 133/69 (08/04 2011) Pulse Rate: 84 (08/04 2011)  Labs: Recent Labs    10/17/19 0432 10/17/19 1316 10/17/19 2118  HGB 14.8  --   --   HCT 46.0  --   --   PLT 215  --   --   APTT 46* 99*  --   LABPROT  --  13.2  --   INR  --  1.0  --   HEPARINUNFRC 0.17* 0.79* 0.88*  CREATININE 0.97 0.97  --     Estimated Creatinine Clearance: 88 mL/min (by C-G formula based on SCr of 0.97 mg/dL).   Assessment: Anticoag: Xarelto PTA with hx FVL/VTE (LD 7/31). Cath with MV CAD. CBC WNL. HL 0.88 elevated  Goal of Therapy:  Heparin level 0.3-0.7 units/ml Monitor platelets by anticoagulation protocol: Yes   Plan:  -Decrease heparin to 1250 units/hr - CABG 8/5   Purity Irmen S. Alford Highland, PharmD, BCPS Clinical Staff Pharmacist [email or web address] Alford Highland, [employer] 10/17/2019,9:57 PM

## 2019-10-17 NOTE — Progress Notes (Addendum)
      [address]       ,[place] [address]             [phone]      Patient denies chest pain.  He does have mild epigastric discomfort after eating which he rates about a 1.  He is for CABG tomorrow with Dr. Bartholome Bill, PA-C   Chart reviewed, patient examined, agree with above. He has been stable today without further discomfort. I discussed surgery again with the patient and his wife and answered their questions. Plan CABG in am.

## 2019-10-17 NOTE — Anesthesia Preprocedure Evaluation (Addendum)
Anesthesia Evaluation  Patient identified by MRN, date of birth, ID band Patient awake    Reviewed: Allergy & Precautions, NPO status , Patient's Chart, lab work & pertinent test results, Unable to perform ROS - Chart review only  Airway Mallampati: II  TM Distance: >3 FB Neck ROM: Full    Dental  (+) Teeth Intact, Dental Advisory Given   Pulmonary neg pulmonary ROS,     + decreased breath sounds      Cardiovascular hypertension, Pt. on medications + angina + CAD   Rhythm:Regular Rate:Normal     Neuro/Psych negative neurological ROS  negative psych ROS   GI/Hepatic negative GI ROS, Neg liver ROS,   Endo/Other  negative endocrine ROS  Renal/GU negative Renal ROS     Musculoskeletal negative musculoskeletal ROS (+)   Abdominal (+) + obese,   Peds  Hematology negative hematology ROS (+) anemia ,   Anesthesia Other Findings   Reproductive/Obstetrics                           Anesthesia Physical Anesthesia Plan  ASA: IV  Anesthesia Plan: General   Post-op Pain Management:    Induction: Intravenous  PONV Risk Score and Plan: 3 and Midazolam and Treatment may vary due to age or medical condition  Airway Management Planned: Oral ETT  Additional Equipment: Arterial line, CVP, PA Cath, TEE and Ultrasound Guidance Line Placement  Intra-op Plan:   Post-operative Plan: Post-operative intubation/ventilation  Informed Consent: I have reviewed the patients History and Physical, chart, labs and discussed the procedure including the risks, benefits and alternatives for the proposed anesthesia with the patient or authorized representative who has indicated his/her understanding and acceptance.     Dental advisory given  Plan Discussed with: CRNA  Anesthesia Plan Comments:        Anesthesia Quick Evaluation

## 2019-10-17 NOTE — Progress Notes (Signed)
Pt off unit for testing. ABG to be drawn when pt arrives back on floor.

## 2019-10-17 NOTE — Progress Notes (Signed)
Pre-CABG study completed.   See Cv Proc for preliminary results.   Jose Bowers

## 2019-10-17 NOTE — Progress Notes (Signed)
ABG drawn and Aaron Edelman in lab notified of sample being sent. RT will continue to monitor.

## 2019-10-18 ENCOUNTER — Inpatient Hospital Stay (HOSPITAL_COMMUNITY): Payer: Medicare Other | Admitting: Anesthesiology

## 2019-10-18 ENCOUNTER — Inpatient Hospital Stay (HOSPITAL_COMMUNITY): Payer: Medicare Other

## 2019-10-18 ENCOUNTER — Encounter (HOSPITAL_COMMUNITY): Payer: Self-pay | Admitting: Cardiology

## 2019-10-18 ENCOUNTER — Inpatient Hospital Stay (HOSPITAL_COMMUNITY)
Admission: RE | Disposition: A | Discharge status: Home / Self Care | Payer: Self-pay | Source: Ambulatory Visit | Attending: Surgery

## 2019-10-18 DIAGNOSIS — Z951 Presence of aortocoronary bypass graft: Secondary | ICD-10-CM

## 2019-10-18 DIAGNOSIS — I2510 Atherosclerotic heart disease of native coronary artery without angina pectoris: Secondary | ICD-10-CM | POA: Diagnosis present

## 2019-10-18 HISTORY — PX: CORONARY ARTERY BYPASS GRAFT: SHX141

## 2019-10-18 HISTORY — PX: TEE WITHOUT CARDIOVERSION: SHX5443

## 2019-10-18 LAB — POCT I-STAT 7, (LYTES, BLD GAS, ICA,H+H)
Acid-Base Excess: 2 mmol/L (ref 0.0–2.0)
Acid-Base Excess: 1 mmol/L (ref 0.0–2.0)
Acid-Base Excess: 3 mmol/L — ABNORMAL HIGH (ref 0.0–2.0)
Acid-Base Excess: 1 mmol/L (ref 0.0–2.0)
Acid-Base Excess: 2 mmol/L (ref 0.0–2.0)
Acid-base deficit: 1 mmol/L (ref 0.0–2.0)
Acid-base deficit: 3 mmol/L — ABNORMAL HIGH (ref 0.0–2.0)
Bicarbonate: 28 mmol/L (ref 20.0–28.0)
Bicarbonate: 27.3 mmol/L (ref 20.0–28.0)
Bicarbonate: 28.8 mmol/L — ABNORMAL HIGH (ref 20.0–28.0)
Bicarbonate: 26.1 mmol/L (ref 20.0–28.0)
Bicarbonate: 24 mmol/L (ref 20.0–28.0)
Bicarbonate: 23 mmol/L (ref 20.0–28.0)
Bicarbonate: 26.8 mmol/L (ref 20.0–28.0)
Calcium, Ion: 1.24 mmol/L (ref 1.15–1.40)
Calcium, Ion: 1.09 mmol/L — ABNORMAL LOW (ref 1.15–1.40)
Calcium, Ion: 1.13 mmol/L — ABNORMAL LOW (ref 1.15–1.40)
Calcium, Ion: 1.11 mmol/L — ABNORMAL LOW (ref 1.15–1.40)
Calcium, Ion: 1.16 mmol/L (ref 1.15–1.40)
Calcium, Ion: 1.17 mmol/L (ref 1.15–1.40)
Calcium, Ion: 1.11 mmol/L — ABNORMAL LOW (ref 1.15–1.40)
HCT: 43 % (ref 39.0–52.0)
HCT: 37 % — ABNORMAL LOW (ref 39.0–52.0)
HCT: 35 % — ABNORMAL LOW (ref 39.0–52.0)
HCT: 33 % — ABNORMAL LOW (ref 39.0–52.0)
HCT: 35 % — ABNORMAL LOW (ref 39.0–52.0)
HCT: 42 % (ref 39.0–52.0)
HCT: 38 % — ABNORMAL LOW (ref 39.0–52.0)
Hemoglobin: 14.6 g/dL (ref 13.0–17.0)
Hemoglobin: 12.6 g/dL — ABNORMAL LOW (ref 13.0–17.0)
Hemoglobin: 11.9 g/dL — ABNORMAL LOW (ref 13.0–17.0)
Hemoglobin: 11.2 g/dL — ABNORMAL LOW (ref 13.0–17.0)
Hemoglobin: 11.9 g/dL — ABNORMAL LOW (ref 13.0–17.0)
Hemoglobin: 14.3 g/dL (ref 13.0–17.0)
Hemoglobin: 12.9 g/dL — ABNORMAL LOW (ref 13.0–17.0)
O2 Saturation: 100 %
O2 Saturation: 100 %
O2 Saturation: 100 %
O2 Saturation: 100 %
O2 Saturation: 99 %
O2 Saturation: 98 %
O2 Saturation: 97 %
Patient temperature: 35.5
Patient temperature: 37.1
Patient temperature: 37.9
Potassium: 3.7 mmol/L (ref 3.5–5.1)
Potassium: 4.1 mmol/L (ref 3.5–5.1)
Potassium: 4.4 mmol/L (ref 3.5–5.1)
Potassium: 4 mmol/L (ref 3.5–5.1)
Potassium: 3.9 mmol/L (ref 3.5–5.1)
Potassium: 4.2 mmol/L (ref 3.5–5.1)
Potassium: 4.1 mmol/L (ref 3.5–5.1)
Sodium: 140 mmol/L (ref 135–145)
Sodium: 140 mmol/L (ref 135–145)
Sodium: 140 mmol/L (ref 135–145)
Sodium: 140 mmol/L (ref 135–145)
Sodium: 140 mmol/L (ref 135–145)
Sodium: 140 mmol/L (ref 135–145)
Sodium: 141 mmol/L (ref 135–145)
TCO2: 29 mmol/L (ref 22–32)
TCO2: 29 mmol/L (ref 22–32)
TCO2: 30 mmol/L (ref 22–32)
TCO2: 27 mmol/L (ref 22–32)
TCO2: 25 mmol/L (ref 22–32)
TCO2: 24 mmol/L (ref 22–32)
TCO2: 28 mmol/L (ref 22–32)
pCO2 arterial: 46.4 mmHg (ref 32.0–48.0)
pCO2 arterial: 47.3 mmHg (ref 32.0–48.0)
pCO2 arterial: 47.6 mmHg (ref 32.0–48.0)
pCO2 arterial: 42 mmHg (ref 32.0–48.0)
pCO2 arterial: 36.2 mmHg (ref 32.0–48.0)
pCO2 arterial: 42.4 mmHg (ref 32.0–48.0)
pCO2 arterial: 43.4 mmHg (ref 32.0–48.0)
pH, Arterial: 7.389 (ref 7.350–7.450)
pH, Arterial: 7.37 (ref 7.350–7.450)
pH, Arterial: 7.389 (ref 7.350–7.450)
pH, Arterial: 7.403 (ref 7.350–7.450)
pH, Arterial: 7.423 (ref 7.350–7.450)
pH, Arterial: 7.342 — ABNORMAL LOW (ref 7.350–7.450)
pH, Arterial: 7.403 (ref 7.350–7.450)
pO2, Arterial: 263 mmHg — ABNORMAL HIGH (ref 83.0–108.0)
pO2, Arterial: 427 mmHg — ABNORMAL HIGH (ref 83.0–108.0)
pO2, Arterial: 389 mmHg — ABNORMAL HIGH (ref 83.0–108.0)
pO2, Arterial: 300 mmHg — ABNORMAL HIGH (ref 83.0–108.0)
pO2, Arterial: 106 mmHg (ref 83.0–108.0)
pO2, Arterial: 116 mmHg — ABNORMAL HIGH (ref 83.0–108.0)
pO2, Arterial: 94 mmHg (ref 83.0–108.0)

## 2019-10-18 LAB — POCT I-STAT, CHEM 8
BUN: 11 mg/dL (ref 8–23)
BUN: 11 mg/dL (ref 8–23)
BUN: 10 mg/dL (ref 8–23)
BUN: 11 mg/dL (ref 8–23)
BUN: 10 mg/dL (ref 8–23)
Calcium, Ion: 1.27 mmol/L (ref 1.15–1.40)
Calcium, Ion: 1.24 mmol/L (ref 1.15–1.40)
Calcium, Ion: 0.99 mmol/L — ABNORMAL LOW (ref 1.15–1.40)
Calcium, Ion: 1.18 mmol/L (ref 1.15–1.40)
Calcium, Ion: 1.08 mmol/L — ABNORMAL LOW (ref 1.15–1.40)
Chloride: 101 mmol/L (ref 98–111)
Chloride: 102 mmol/L (ref 98–111)
Chloride: 100 mmol/L (ref 98–111)
Chloride: 102 mmol/L (ref 98–111)
Chloride: 102 mmol/L (ref 98–111)
Creatinine, Ser: 0.9 mg/dL (ref 0.61–1.24)
Creatinine, Ser: 0.9 mg/dL (ref 0.61–1.24)
Creatinine, Ser: 0.7 mg/dL (ref 0.61–1.24)
Creatinine, Ser: 0.7 mg/dL (ref 0.61–1.24)
Creatinine, Ser: 0.7 mg/dL (ref 0.61–1.24)
Glucose, Bld: 112 mg/dL — ABNORMAL HIGH (ref 70–99)
Glucose, Bld: 115 mg/dL — ABNORMAL HIGH (ref 70–99)
Glucose, Bld: 101 mg/dL — ABNORMAL HIGH (ref 70–99)
Glucose, Bld: 165 mg/dL — ABNORMAL HIGH (ref 70–99)
Glucose, Bld: 131 mg/dL — ABNORMAL HIGH (ref 70–99)
HCT: 41 % (ref 39.0–52.0)
HCT: 41 % (ref 39.0–52.0)
HCT: 36 % — ABNORMAL LOW (ref 39.0–52.0)
HCT: 39 % (ref 39.0–52.0)
HCT: 35 % — ABNORMAL LOW (ref 39.0–52.0)
Hemoglobin: 13.9 g/dL (ref 13.0–17.0)
Hemoglobin: 13.9 g/dL (ref 13.0–17.0)
Hemoglobin: 12.2 g/dL — ABNORMAL LOW (ref 13.0–17.0)
Hemoglobin: 13.3 g/dL (ref 13.0–17.0)
Hemoglobin: 11.9 g/dL — ABNORMAL LOW (ref 13.0–17.0)
Potassium: 3.6 mmol/L (ref 3.5–5.1)
Potassium: 3.9 mmol/L (ref 3.5–5.1)
Potassium: 4 mmol/L (ref 3.5–5.1)
Potassium: 4.5 mmol/L (ref 3.5–5.1)
Potassium: 4 mmol/L (ref 3.5–5.1)
Sodium: 142 mmol/L (ref 135–145)
Sodium: 141 mmol/L (ref 135–145)
Sodium: 141 mmol/L (ref 135–145)
Sodium: 141 mmol/L (ref 135–145)
Sodium: 141 mmol/L (ref 135–145)
TCO2: 27 mmol/L (ref 22–32)
TCO2: 26 mmol/L (ref 22–32)
TCO2: 26 mmol/L (ref 22–32)
TCO2: 26 mmol/L (ref 22–32)
TCO2: 24 mmol/L (ref 22–32)

## 2019-10-18 LAB — BASIC METABOLIC PANEL
Anion gap: 12 (ref 5–15)
Anion gap: 9 (ref 5–15)
BUN: 11 mg/dL (ref 8–23)
BUN: 12 mg/dL (ref 8–23)
CO2: 27 mmol/L (ref 22–32)
CO2: 22 mmol/L (ref 22–32)
Calcium: 9.1 mg/dL (ref 8.9–10.3)
Calcium: 8.1 mg/dL — ABNORMAL LOW (ref 8.9–10.3)
Chloride: 100 mmol/L (ref 98–111)
Chloride: 107 mmol/L (ref 98–111)
Creatinine, Ser: 1.16 mg/dL (ref 0.61–1.24)
Creatinine, Ser: 0.91 mg/dL (ref 0.61–1.24)
GFR calc Af Amer: >60 mL/min (ref >60)
GFR calc Af Amer: >60 mL/min (ref >60)
GFR calc non Af Amer: >60 mL/min (ref >60)
GFR calc non Af Amer: >60 mL/min (ref >60)
Glucose, Bld: 121 mg/dL — ABNORMAL HIGH (ref 70–99)
Glucose, Bld: 127 mg/dL — ABNORMAL HIGH (ref 70–99)
Potassium: 3.7 mmol/L (ref 3.5–5.1)
Potassium: 3.9 mmol/L (ref 3.5–5.1)
Sodium: 139 mmol/L (ref 135–145)
Sodium: 138 mmol/L (ref 135–145)

## 2019-10-18 LAB — MAGNESIUM: Magnesium: 2.6 mg/dL — ABNORMAL HIGH (ref 1.7–2.4)

## 2019-10-18 LAB — HEMOGLOBIN A1C
Hgb A1c MFr Bld: 5.9 % — ABNORMAL HIGH (ref 4.8–5.6)
Mean Plasma Glucose: 122.63 mg/dL

## 2019-10-18 LAB — APTT
aPTT: 137 seconds — ABNORMAL HIGH (ref 24–36)
aPTT: 31 seconds (ref 24–36)

## 2019-10-18 LAB — CBC
HCT: 46.7 % (ref 39.0–52.0)
HCT: 36.4 % — ABNORMAL LOW (ref 39.0–52.0)
HCT: 39.1 % (ref 39.0–52.0)
Hemoglobin: 15.3 g/dL (ref 13.0–17.0)
Hemoglobin: 11.8 g/dL — ABNORMAL LOW (ref 13.0–17.0)
Hemoglobin: 12.8 g/dL — ABNORMAL LOW (ref 13.0–17.0)
MCH: 30.8 pg (ref 26.0–34.0)
MCH: 30.6 pg (ref 26.0–34.0)
MCH: 30.8 pg (ref 26.0–34.0)
MCHC: 32.8 g/dL (ref 30.0–36.0)
MCHC: 32.4 g/dL (ref 30.0–36.0)
MCHC: 32.7 g/dL (ref 30.0–36.0)
MCV: 94 fL (ref 80.0–100.0)
MCV: 94.5 fL (ref 80.0–100.0)
MCV: 94.2 fL (ref 80.0–100.0)
Platelets: 236 10*3/uL (ref 150–400)
Platelets: 128 10*3/uL — ABNORMAL LOW (ref 150–400)
Platelets: 177 10*3/uL (ref 150–400)
RBC: 4.97 MIL/uL (ref 4.22–5.81)
RBC: 3.85 MIL/uL — ABNORMAL LOW (ref 4.22–5.81)
RBC: 4.15 MIL/uL — ABNORMAL LOW (ref 4.22–5.81)
RDW: 12.8 % (ref 11.5–15.5)
RDW: 12.7 % (ref 11.5–15.5)
RDW: 12.7 % (ref 11.5–15.5)
WBC: 5.5 10*3/uL (ref 4.0–10.5)
WBC: 11.5 10*3/uL — ABNORMAL HIGH (ref 4.0–10.5)
WBC: 11.1 10*3/uL — ABNORMAL HIGH (ref 4.0–10.5)
nRBC: 0 % (ref 0.0–0.2)
nRBC: 0 % (ref 0.0–0.2)
nRBC: 0 % (ref 0.0–0.2)

## 2019-10-18 LAB — HEMOGLOBIN AND HEMATOCRIT, BLOOD
HCT: 37.3 % — ABNORMAL LOW (ref 39.0–52.0)
Hemoglobin: 12.2 g/dL — ABNORMAL LOW (ref 13.0–17.0)

## 2019-10-18 LAB — LIPID PANEL
Cholesterol: 171 mg/dL (ref 0–200)
HDL: 48 mg/dL (ref >40)
LDL Cholesterol: 99 mg/dL (ref 0–99)
Total CHOL/HDL Ratio: 3.6 RATIO
Triglycerides: 120 mg/dL (ref <150)
VLDL: 24 mg/dL (ref 0–40)

## 2019-10-18 LAB — ECHO INTRAOPERATIVE TEE
AV Mean grad: 2 mmHg
Height: 70 in
Weight: 3464.22 oz

## 2019-10-18 LAB — PROTIME-INR
INR: 1.3 — ABNORMAL HIGH (ref 0.8–1.2)
Prothrombin Time: 15.8 seconds — ABNORMAL HIGH (ref 11.4–15.2)

## 2019-10-18 LAB — GLUCOSE, CAPILLARY
Glucose-Capillary: 120 mg/dL — ABNORMAL HIGH (ref 70–99)
Glucose-Capillary: 122 mg/dL — ABNORMAL HIGH (ref 70–99)
Glucose-Capillary: 128 mg/dL — ABNORMAL HIGH (ref 70–99)
Glucose-Capillary: 149 mg/dL — ABNORMAL HIGH (ref 70–99)
Glucose-Capillary: 135 mg/dL — ABNORMAL HIGH (ref 70–99)
Glucose-Capillary: 129 mg/dL — ABNORMAL HIGH (ref 70–99)
Glucose-Capillary: 127 mg/dL — ABNORMAL HIGH (ref 70–99)
Glucose-Capillary: 122 mg/dL — ABNORMAL HIGH (ref 70–99)

## 2019-10-18 LAB — HEPARIN LEVEL (UNFRACTIONATED): Heparin Unfractionated: 0.84 IU/mL — ABNORMAL HIGH (ref 0.30–0.70)

## 2019-10-18 LAB — PLATELET COUNT: Platelets: 186 10*3/uL (ref 150–400)

## 2019-10-18 SURGERY — CORONARY ARTERY BYPASS GRAFTING (CABG)
Anesthesia: General | Site: Chest

## 2019-10-18 MED ORDER — PHENYLEPHRINE HCL-NACL 20-0.9 MG/250ML-% IV SOLN: 0.0000 ug/min | INTRAVENOUS | Status: DC

## 2019-10-18 MED ORDER — LACTATED RINGERS IV SOLN: INTRAVENOUS | Status: DC | PRN

## 2019-10-18 MED ORDER — ACETAMINOPHEN 160 MG/5ML PO SOLN: 1000.0000 mg | Freq: Four times a day (QID) | ORAL | Status: DC

## 2019-10-18 MED ORDER — MAGNESIUM SULFATE 4 GM/100ML IV SOLN
4.0000 g | Freq: Once | INTRAVENOUS | Status: AC
  Administered 2019-10-18: 4 g via INTRAVENOUS
  Filled 2019-10-18: qty 100

## 2019-10-18 MED ORDER — PROTAMINE SULFATE 10 MG/ML IV SOLN
INTRAVENOUS | Status: DC | PRN
  Administered 2019-10-18 (×3): 50 mg via INTRAVENOUS
  Administered 2019-10-18: 20 mg via INTRAVENOUS
  Administered 2019-10-18: 50 mg via INTRAVENOUS
  Administered 2019-10-18: 30 mg via INTRAVENOUS
  Administered 2019-10-18 (×2): 50 mg via INTRAVENOUS

## 2019-10-18 MED ORDER — FENTANYL CITRATE (PF) 250 MCG/5ML IJ SOLN
INTRAMUSCULAR | Status: AC
  Filled 2019-10-18: qty 25

## 2019-10-18 MED ORDER — PLASMA-LYTE 148 IV SOLN: INTRAVENOUS | Status: DC | PRN

## 2019-10-18 MED ORDER — ALBUMIN HUMAN 5 % IV SOLN
250.0000 mL | INTRAVENOUS | Status: DC | PRN
  Administered 2019-10-18: 12.5 g via INTRAVENOUS

## 2019-10-18 MED ORDER — LACTATED RINGERS IV SOLN: INTRAVENOUS | Status: DC

## 2019-10-18 MED ORDER — FENTANYL CITRATE (PF) 250 MCG/5ML IJ SOLN
INTRAMUSCULAR | Status: AC
  Filled 2019-10-18: qty 5

## 2019-10-18 MED ORDER — ACETAMINOPHEN 650 MG RE SUPP: 650.0000 mg | Freq: Once | RECTAL | Status: DC

## 2019-10-18 MED ORDER — ALBUMIN HUMAN 5 % IV SOLN: INTRAVENOUS | Status: DC | PRN

## 2019-10-18 MED ORDER — DEXMEDETOMIDINE HCL IN NACL 400 MCG/100ML IV SOLN: 0.0000 ug/kg/h | INTRAVENOUS | Status: DC

## 2019-10-18 MED ORDER — CHLORHEXIDINE GLUCONATE CLOTH 2 % EX PADS
6.0000 | MEDICATED_PAD | Freq: Every day | CUTANEOUS | Status: DC
  Administered 2019-10-18 – 2019-10-20 (×3): 6 via TOPICAL

## 2019-10-18 MED ORDER — METOPROLOL TARTRATE 25 MG/10 ML ORAL SUSPENSION: 12.5000 mg | Freq: Two times a day (BID) | ORAL | Status: DC

## 2019-10-18 MED ORDER — INSULIN REGULAR(HUMAN) IN NACL 100-0.9 UT/100ML-% IV SOLN: INTRAVENOUS | Status: DC

## 2019-10-18 MED ORDER — SODIUM CHLORIDE 0.9% FLUSH
3.0000 mL | Freq: Two times a day (BID) | INTRAVENOUS | Status: DC
  Administered 2019-10-19 – 2019-10-20 (×3): 3 mL via INTRAVENOUS

## 2019-10-18 MED ORDER — MIDAZOLAM HCL 2 MG/2ML IJ SOLN
INTRAMUSCULAR | Status: AC
  Filled 2019-10-18: qty 2

## 2019-10-18 MED ORDER — HEPARIN SODIUM (PORCINE) 1000 UNIT/ML IJ SOLN
INTRAMUSCULAR | Status: AC
  Filled 2019-10-18: qty 1

## 2019-10-18 MED ORDER — ORAL CARE MOUTH RINSE
15.0000 mL | Freq: Two times a day (BID) | OROMUCOSAL | Status: DC
  Administered 2019-10-18 – 2019-10-21 (×6): 15 mL via OROMUCOSAL

## 2019-10-18 MED ORDER — LIDOCAINE 2% (20 MG/ML) 5 ML SYRINGE
INTRAMUSCULAR | Status: AC
  Filled 2019-10-18: qty 5

## 2019-10-18 MED ORDER — ROCURONIUM BROMIDE 10 MG/ML (PF) SYRINGE
PREFILLED_SYRINGE | INTRAVENOUS | Status: DC | PRN
  Administered 2019-10-18: 50 mg via INTRAVENOUS
  Administered 2019-10-18: 100 mg via INTRAVENOUS
  Administered 2019-10-18: 50 mg via INTRAVENOUS
  Administered 2019-10-18: 60 mg via INTRAVENOUS

## 2019-10-18 MED ORDER — BISACODYL 10 MG RE SUPP
10.0000 mg | Freq: Every day | RECTAL | Status: DC
  Filled 2019-10-18: qty 1

## 2019-10-18 MED ORDER — MEPERIDINE HCL 25 MG/ML IJ SOLN
12.5000 mg | Freq: Once | INTRAMUSCULAR | Status: DC
  Filled 2019-10-18: qty 1

## 2019-10-18 MED ORDER — ARTIFICIAL TEARS OPHTHALMIC OINT
TOPICAL_OINTMENT | OPHTHALMIC | Status: DC | PRN
  Administered 2019-10-18: 1 via OPHTHALMIC

## 2019-10-18 MED ORDER — PHENYLEPHRINE HCL-NACL 10-0.9 MG/250ML-% IV SOLN
INTRAVENOUS | Status: DC | PRN
  Administered 2019-10-18: 20 ug/min via INTRAVENOUS

## 2019-10-18 MED ORDER — PANTOPRAZOLE SODIUM 40 MG PO TBEC
40.0000 mg | DELAYED_RELEASE_TABLET | Freq: Every day | ORAL | Status: DC
  Administered 2019-10-20 – 2019-10-22 (×3): 40 mg via ORAL
  Filled 2019-10-18 (×3): qty 1

## 2019-10-18 MED ORDER — PROPOFOL 10 MG/ML IV BOLUS
INTRAVENOUS | Status: AC
  Filled 2019-10-18: qty 20

## 2019-10-18 MED ORDER — ACETAMINOPHEN 500 MG PO TABS
1000.0000 mg | ORAL_TABLET | Freq: Four times a day (QID) | ORAL | Status: DC
  Administered 2019-10-19 – 2019-10-22 (×12): 1000 mg via ORAL
  Filled 2019-10-18 (×11): qty 2

## 2019-10-18 MED ORDER — METOPROLOL TARTRATE 12.5 MG HALF TABLET
12.5000 mg | ORAL_TABLET | Freq: Two times a day (BID) | ORAL | Status: DC
  Filled 2019-10-18: qty 1

## 2019-10-18 MED ORDER — SODIUM CHLORIDE 0.9 % IV SOLN: INTRAVENOUS | Status: DC | PRN

## 2019-10-18 MED ORDER — LACTATED RINGERS IV SOLN: 500.0000 mL | Freq: Once | INTRAVENOUS | Status: DC | PRN

## 2019-10-18 MED ORDER — MIDAZOLAM HCL (PF) 10 MG/2ML IJ SOLN
INTRAMUSCULAR | Status: AC
  Filled 2019-10-18: qty 2

## 2019-10-18 MED ORDER — OXYCODONE HCL 5 MG PO TABS
5.0000 mg | ORAL_TABLET | ORAL | Status: DC | PRN
  Administered 2019-10-19 (×2): 5 mg via ORAL
  Filled 2019-10-18 (×2): qty 1

## 2019-10-18 MED ORDER — SODIUM CHLORIDE 0.9 % IV SOLN
1.5000 g | Freq: Two times a day (BID) | INTRAVENOUS | Status: AC
  Administered 2019-10-18 – 2019-10-20 (×4): 1.5 g via INTRAVENOUS
  Filled 2019-10-18 (×4): qty 1.5

## 2019-10-18 MED ORDER — ASPIRIN EC 325 MG PO TBEC
325.0000 mg | DELAYED_RELEASE_TABLET | Freq: Every day | ORAL | Status: DC
  Administered 2019-10-19 – 2019-10-20 (×2): 325 mg via ORAL
  Filled 2019-10-18 (×2): qty 1

## 2019-10-18 MED ORDER — HEMOSTATIC AGENTS (NO CHARGE) OPTIME
TOPICAL | Status: DC | PRN
  Administered 2019-10-18: 1 via TOPICAL

## 2019-10-18 MED ORDER — FAMOTIDINE IN NACL 20-0.9 MG/50ML-% IV SOLN
20.0000 mg | Freq: Two times a day (BID) | INTRAVENOUS | Status: AC
  Administered 2019-10-18: 20 mg via INTRAVENOUS

## 2019-10-18 MED ORDER — FAMOTIDINE IN NACL 20-0.9 MG/50ML-% IV SOLN
INTRAVENOUS | Status: AC
  Filled 2019-10-18: qty 50

## 2019-10-18 MED ORDER — ARTIFICIAL TEARS OPHTHALMIC OINT
TOPICAL_OINTMENT | OPHTHALMIC | Status: AC
  Filled 2019-10-18: qty 3.5

## 2019-10-18 MED ORDER — HYDRALAZINE HCL 20 MG/ML IJ SOLN: 10.0000 mg | INTRAMUSCULAR | Status: DC | PRN

## 2019-10-18 MED ORDER — SODIUM CHLORIDE 0.9 % IV SOLN: 250.0000 mL | INTRAVENOUS | Status: DC

## 2019-10-18 MED ORDER — MORPHINE SULFATE (PF) 2 MG/ML IV SOLN
1.0000 mg | INTRAVENOUS | Status: DC | PRN
  Administered 2019-10-18 – 2019-10-19 (×3): 2 mg via INTRAVENOUS
  Filled 2019-10-18 (×3): qty 1

## 2019-10-18 MED ORDER — SODIUM CHLORIDE 0.9% FLUSH
10.0000 mL | Freq: Two times a day (BID) | INTRAVENOUS | Status: DC
  Administered 2019-10-18 – 2019-10-20 (×4): 10 mL

## 2019-10-18 MED ORDER — PHENYLEPHRINE 40 MCG/ML (10ML) SYRINGE FOR IV PUSH (FOR BLOOD PRESSURE SUPPORT)
PREFILLED_SYRINGE | INTRAVENOUS | Status: DC | PRN
  Administered 2019-10-18: 80 ug via INTRAVENOUS
  Administered 2019-10-18: 40 ug via INTRAVENOUS

## 2019-10-18 MED ORDER — ACETAMINOPHEN 160 MG/5ML PO SOLN: 650.0000 mg | Freq: Once | ORAL | Status: DC

## 2019-10-18 MED ORDER — PROPOFOL 10 MG/ML IV BOLUS
INTRAVENOUS | Status: DC | PRN
  Administered 2019-10-18 (×2): 30 mg via INTRAVENOUS
  Administered 2019-10-18: 20 mg via INTRAVENOUS
  Administered 2019-10-18: 50 mg via INTRAVENOUS

## 2019-10-18 MED ORDER — SODIUM CHLORIDE 0.45 % IV SOLN: INTRAVENOUS | Status: DC | PRN

## 2019-10-18 MED ORDER — PROTAMINE SULFATE 10 MG/ML IV SOLN
INTRAVENOUS | Status: AC
  Filled 2019-10-18: qty 5

## 2019-10-18 MED ORDER — DOCUSATE SODIUM 100 MG PO CAPS
200.0000 mg | ORAL_CAPSULE | Freq: Every day | ORAL | Status: DC
  Administered 2019-10-19 – 2019-10-22 (×4): 200 mg via ORAL
  Filled 2019-10-18 (×4): qty 2

## 2019-10-18 MED ORDER — SODIUM CHLORIDE 0.9 % IV SOLN: INTRAVENOUS | Status: DC

## 2019-10-18 MED ORDER — 0.9 % SODIUM CHLORIDE (POUR BTL) OPTIME
TOPICAL | Status: DC | PRN
  Administered 2019-10-18: 5000 mL

## 2019-10-18 MED ORDER — CHLORHEXIDINE GLUCONATE 0.12% ORAL RINSE (MEDLINE KIT): 15.0000 mL | Freq: Two times a day (BID) | OROMUCOSAL | Status: DC

## 2019-10-18 MED ORDER — MIDAZOLAM HCL 5 MG/5ML IJ SOLN
INTRAMUSCULAR | Status: DC | PRN
  Administered 2019-10-18: 2 mg via INTRAVENOUS
  Administered 2019-10-18: 4 mg via INTRAVENOUS
  Administered 2019-10-18: 2 mg via INTRAVENOUS
  Administered 2019-10-18: 1 mg via INTRAVENOUS
  Administered 2019-10-18: 3 mg via INTRAVENOUS

## 2019-10-18 MED ORDER — TRAMADOL HCL 50 MG PO TABS
50.0000 mg | ORAL_TABLET | ORAL | Status: DC | PRN
  Administered 2019-10-18 – 2019-10-20 (×3): 50 mg via ORAL
  Filled 2019-10-18 (×3): qty 1

## 2019-10-18 MED ORDER — ORAL CARE MOUTH RINSE
15.0000 mL | OROMUCOSAL | Status: DC
  Administered 2019-10-18: 15 mL via OROMUCOSAL

## 2019-10-18 MED ORDER — METOPROLOL TARTRATE 5 MG/5ML IV SOLN
2.5000 mg | INTRAVENOUS | Status: DC | PRN
  Administered 2019-10-18: 5 mg via INTRAVENOUS
  Filled 2019-10-18: qty 5

## 2019-10-18 MED ORDER — SODIUM BICARBONATE 8.4 % IV SOLN
50.0000 meq | Freq: Once | INTRAVENOUS | Status: AC
  Administered 2019-10-18: 50 meq via INTRAVENOUS

## 2019-10-18 MED ORDER — SODIUM CHLORIDE 0.9% FLUSH: 10.0000 mL | INTRAVENOUS | Status: DC | PRN

## 2019-10-18 MED ORDER — SUCCINYLCHOLINE CHLORIDE 200 MG/10ML IV SOSY
PREFILLED_SYRINGE | INTRAVENOUS | Status: AC
  Filled 2019-10-18: qty 10

## 2019-10-18 MED ORDER — ROCURONIUM BROMIDE 10 MG/ML (PF) SYRINGE
PREFILLED_SYRINGE | INTRAVENOUS | Status: AC
  Filled 2019-10-18: qty 10

## 2019-10-18 MED ORDER — PHENYLEPHRINE HCL-NACL 10-0.9 MG/250ML-% IV SOLN
INTRAVENOUS | Status: DC | PRN
Start: 2019-10-18 — End: 2019-10-18

## 2019-10-18 MED ORDER — NICARDIPINE HCL IN NACL 20-0.86 MG/200ML-% IV SOLN
3.0000 mg/h | INTRAVENOUS | Status: DC
  Filled 2019-10-18: qty 200

## 2019-10-18 MED ORDER — ASPIRIN 81 MG PO CHEW: 324.0000 mg | CHEWABLE_TABLET | Freq: Every day | ORAL | Status: DC

## 2019-10-18 MED ORDER — FENTANYL CITRATE (PF) 250 MCG/5ML IJ SOLN
INTRAMUSCULAR | Status: DC | PRN
  Administered 2019-10-18: 50 ug via INTRAVENOUS
  Administered 2019-10-18 (×2): 150 ug via INTRAVENOUS
  Administered 2019-10-18: 50 ug via INTRAVENOUS
  Administered 2019-10-18: 100 ug via INTRAVENOUS
  Administered 2019-10-18: 250 ug via INTRAVENOUS
  Administered 2019-10-18: 150 ug via INTRAVENOUS
  Administered 2019-10-18: 300 ug via INTRAVENOUS
  Administered 2019-10-18 (×3): 100 ug via INTRAVENOUS

## 2019-10-18 MED ORDER — NITROGLYCERIN IN D5W 200-5 MCG/ML-% IV SOLN: 0.0000 ug/min | INTRAVENOUS | Status: DC

## 2019-10-18 MED ORDER — THROMBIN 20000 UNITS EX SOLR
OROMUCOSAL | Status: DC | PRN
  Administered 2019-10-18 (×3): 4 mL via TOPICAL

## 2019-10-18 MED ORDER — CHLORHEXIDINE GLUCONATE 0.12 % MT SOLN
15.0000 mL | OROMUCOSAL | Status: AC
  Administered 2019-10-18: 15 mL via OROMUCOSAL
  Filled 2019-10-18: qty 15

## 2019-10-18 MED ORDER — PROTAMINE SULFATE 10 MG/ML IV SOLN
INTRAVENOUS | Status: AC
  Filled 2019-10-18: qty 25

## 2019-10-18 MED ORDER — BISACODYL 5 MG PO TBEC
10.0000 mg | DELAYED_RELEASE_TABLET | Freq: Every day | ORAL | Status: DC
  Administered 2019-10-19 – 2019-10-21 (×3): 10 mg via ORAL
  Filled 2019-10-18 (×4): qty 2

## 2019-10-18 MED ORDER — DEXTROSE 50 % IV SOLN: 0.0000 mL | INTRAVENOUS | Status: DC | PRN

## 2019-10-18 MED ORDER — PHENYLEPHRINE HCL (PRESSORS) 10 MG/ML IV SOLN
INTRAVENOUS | Status: DC | PRN
  Administered 2019-10-18: 40 ug via INTRAVENOUS

## 2019-10-18 MED ORDER — HYDRALAZINE HCL 20 MG/ML IJ SOLN
INTRAMUSCULAR | Status: AC
  Administered 2019-10-18: 10 mg via INTRAVENOUS
  Filled 2019-10-18: qty 1

## 2019-10-18 MED ORDER — POTASSIUM CHLORIDE 10 MEQ/50ML IV SOLN
10.0000 meq | INTRAVENOUS | Status: AC
  Administered 2019-10-18 (×3): 10 meq via INTRAVENOUS
  Filled 2019-10-18: qty 50

## 2019-10-18 MED ORDER — THROMBIN (RECOMBINANT) 20000 UNITS EX SOLR
CUTANEOUS | Status: AC
  Filled 2019-10-18: qty 20000

## 2019-10-18 MED ORDER — PHENYLEPHRINE 40 MCG/ML (10ML) SYRINGE FOR IV PUSH (FOR BLOOD PRESSURE SUPPORT)
PREFILLED_SYRINGE | INTRAVENOUS | Status: AC
  Filled 2019-10-18: qty 10

## 2019-10-18 MED ORDER — THROMBIN 20000 UNITS EX SOLR
CUTANEOUS | Status: DC | PRN
  Administered 2019-10-18: 20000 [IU] via TOPICAL

## 2019-10-18 MED ORDER — MIDAZOLAM HCL 2 MG/2ML IJ SOLN: 2.0000 mg | INTRAMUSCULAR | Status: DC | PRN

## 2019-10-18 MED ORDER — VANCOMYCIN HCL IN DEXTROSE 1-5 GM/200ML-% IV SOLN
1000.0000 mg | Freq: Once | INTRAVENOUS | Status: AC
  Administered 2019-10-18: 1000 mg via INTRAVENOUS
  Filled 2019-10-18: qty 200

## 2019-10-18 MED ORDER — SODIUM CHLORIDE 0.9% FLUSH: 3.0000 mL | INTRAVENOUS | Status: DC | PRN

## 2019-10-18 MED ORDER — EPHEDRINE 5 MG/ML INJ
INTRAVENOUS | Status: AC
  Filled 2019-10-18: qty 10

## 2019-10-18 MED ORDER — HEPARIN SODIUM (PORCINE) 1000 UNIT/ML IJ SOLN
INTRAMUSCULAR | Status: DC | PRN
  Administered 2019-10-18: 35000 [IU] via INTRAVENOUS

## 2019-10-18 MED ORDER — ONDANSETRON HCL 4 MG/2ML IJ SOLN
4.0000 mg | Freq: Four times a day (QID) | INTRAMUSCULAR | Status: DC | PRN
  Filled 2019-10-18: qty 2

## 2019-10-18 SURGICAL SUPPLY — 70 items
BAG DECANTER FOR FLEXI CONT (MISCELLANEOUS) ×3 IMPLANT
BLADE CLIPPER SURG (BLADE) ×3 IMPLANT
BLADE STERNUM SYSTEM 6 (BLADE) ×3 IMPLANT
BNDG ELASTIC 4X5.8 VLCR STR LF (GAUZE/BANDAGES/DRESSINGS) ×3 IMPLANT
BNDG ELASTIC 6X5.8 VLCR STR LF (GAUZE/BANDAGES/DRESSINGS) ×3 IMPLANT
BNDG GAUZE ELAST 4 BULKY (GAUZE/BANDAGES/DRESSINGS) ×3 IMPLANT
CANISTER SUCT 3000ML PPV (MISCELLANEOUS) ×3 IMPLANT
CATH ROBINSON RED A/P 18FR (CATHETERS) ×6 IMPLANT
CATH THORACIC 28FR (CATHETERS) ×3 IMPLANT
CATH THORACIC 36FR (CATHETERS) ×3 IMPLANT
CATH THORACIC 36FR RT ANG (CATHETERS) ×3 IMPLANT
CLIP VESOCCLUDE MED 24/CT (CLIP) IMPLANT
CLIP VESOCCLUDE SM WIDE 24/CT (CLIP) ×6 IMPLANT
DERMABOND ADVANCED (GAUZE/BANDAGES/DRESSINGS) ×1
DERMABOND ADVANCED .7 DNX12 (GAUZE/BANDAGES/DRESSINGS) ×2 IMPLANT
DRAPE CARDIOVASCULAR INCISE (DRAPES) ×1
DRAPE SLUSH/WARMER DISC (DRAPES) ×3 IMPLANT
DRAPE SRG 135X102X78XABS (DRAPES) ×2 IMPLANT
DRSG COVADERM 4X14 (GAUZE/BANDAGES/DRESSINGS) ×3 IMPLANT
ELECT CAUTERY BLADE 6.4 (BLADE) ×3 IMPLANT
ELECT REM PT RETURN 9FT ADLT (ELECTROSURGICAL) ×6
ELECTRODE REM PT RTRN 9FT ADLT (ELECTROSURGICAL) ×4 IMPLANT
FELT TEFLON 1X6 (MISCELLANEOUS) ×3 IMPLANT
GAUZE SPONGE 4X4 12PLY STRL (GAUZE/BANDAGES/DRESSINGS) ×6 IMPLANT
GLOVE BIO SURGEON STRL SZ 6 (GLOVE) ×9 IMPLANT
GLOVE BIO SURGEON STRL SZ 6.5 (GLOVE) ×9 IMPLANT
GLOVE BIOGEL PI IND STRL 6 (GLOVE) ×8 IMPLANT
GLOVE BIOGEL PI INDICATOR 6 (GLOVE) ×4
GLOVE TRIUMPH SURG SIZE 7.0 (KITS) ×6 IMPLANT
GOWN STRL REUS W/ TWL LRG LVL3 (GOWN DISPOSABLE) ×14 IMPLANT
GOWN STRL REUS W/ TWL XL LVL3 (GOWN DISPOSABLE) ×2 IMPLANT
GOWN STRL REUS W/TWL LRG LVL3 (GOWN DISPOSABLE) ×7
GOWN STRL REUS W/TWL XL LVL3 (GOWN DISPOSABLE) ×1
HEMOSTAT POWDER SURGIFOAM 1G (HEMOSTASIS) ×9 IMPLANT
HEMOSTAT SURGICEL 2X14 (HEMOSTASIS) ×3 IMPLANT
INSERT FOGARTY XLG (MISCELLANEOUS) ×3 IMPLANT
KIT BASIN OR (CUSTOM PROCEDURE TRAY) ×3 IMPLANT
KIT CATH CPB BARTLE (MISCELLANEOUS) ×3 IMPLANT
KIT SUCTION CATH 14FR (SUCTIONS) ×3 IMPLANT
KIT TURNOVER KIT B (KITS) ×3 IMPLANT
KIT VASOVIEW ACCESSORY VH 2004 (KITS) ×3 IMPLANT
KIT VASOVIEW HEMOPRO 2 VH 4000 (KITS) ×3 IMPLANT
NS IRRIG 1000ML POUR BTL (IV SOLUTION) ×15 IMPLANT
PACK E OPEN HEART (SUTURE) ×3 IMPLANT
PACK OPEN HEART (CUSTOM PROCEDURE TRAY) ×3 IMPLANT
PAD ARMBOARD 7.5X6 YLW CONV (MISCELLANEOUS) ×6 IMPLANT
PAD ELECT DEFIB RADIOL ZOLL (MISCELLANEOUS) ×3 IMPLANT
PENCIL BUTTON HOLSTER BLD 10FT (ELECTRODE) ×3 IMPLANT
POSITIONER HEAD DONUT 9IN (MISCELLANEOUS) ×3 IMPLANT
PUNCH AORTIC ROTATE 4.5MM 8IN (MISCELLANEOUS) ×3 IMPLANT
SET CARDIOPLEGIA MPS 5001102 (MISCELLANEOUS) ×3 IMPLANT
SPONGE LAP 18X18 RF (DISPOSABLE) IMPLANT
SPONGE LAP 4X18 RFD (DISPOSABLE) ×6 IMPLANT
SUPPORT HEART JANKE-BARRON (MISCELLANEOUS) ×3 IMPLANT
SUT BONE WAX W31G (SUTURE) ×3 IMPLANT
SUT MNCRL AB 4-0 PS2 18 (SUTURE) ×3 IMPLANT
SUT PROLENE 3 0 SH1 36 (SUTURE) ×3 IMPLANT
SUT PROLENE 6 0 C 1 30 (SUTURE) ×6 IMPLANT
SUT PROLENE 7 0 BV1 MDA (SUTURE) ×6 IMPLANT
SUT STEEL SZ 6 DBL 3X14 BALL (SUTURE) ×9 IMPLANT
SUT VIC AB 1 CTX 36 (SUTURE) ×3
SUT VIC AB 1 CTX36XBRD ANBCTR (SUTURE) ×6 IMPLANT
SYR BULB EAR ULCER 3OZ GRN STR (SYRINGE) ×6 IMPLANT
SYSTEM SAHARA CHEST DRAIN ATS (WOUND CARE) ×3 IMPLANT
TOWEL GREEN STERILE (TOWEL DISPOSABLE) ×3 IMPLANT
TOWEL GREEN STERILE FF (TOWEL DISPOSABLE) ×3 IMPLANT
TRAY FOLEY SLVR 16FR TEMP STAT (SET/KITS/TRAYS/PACK) ×3 IMPLANT
TUBING LAP HI FLOW INSUFFLATIO (TUBING) ×3 IMPLANT
UNDERPAD 30X36 HEAVY ABSORB (UNDERPADS AND DIAPERS) ×3 IMPLANT
WATER STERILE IRR 1000ML POUR (IV SOLUTION) ×6 IMPLANT

## 2019-10-18 NOTE — Anesthesia Procedure Notes (Signed)
Central Venous Catheter Insertion Performed by: Nolon Nations, MD, anesthesiologist Start/End8/07/2019 7:25 AM, 10/18/2019 7:30 AM Patient location: Pre-op. Preanesthetic checklist: patient identified, IV checked, site marked, risks and benefits discussed, surgical consent, monitors and equipment checked, pre-op evaluation, timeout performed and anesthesia consent Hand hygiene performed  and maximum sterile barriers used  PA cath was placed.Swan type:thermodilution PA Cath depth:48 Procedure performed without using ultrasound guided technique. Attempts: 1 Patient tolerated the procedure well with no immediate complications.

## 2019-10-18 NOTE — Anesthesia Postprocedure Evaluation (Signed)
Anesthesia Post Note  Patient: Jose Bowers  Procedure(s) Performed: CORONARY ARTERY BYPASS GRAFTING (CABG), ON PUMP, TIMES FOUR, USING LEFT INTERAL MAMMARY ATERY AND ENDOSCOPICALLY HARVESTED RIGHT GREATER SAPHENOUS VEIN (N/A Chest) TRANSESOPHAGEAL ECHOCARDIOGRAM (TEE) (N/A )     Patient location during evaluation: SICU Anesthesia Type: General Level of consciousness: sedated and patient remains intubated per anesthesia plan Pain management: pain level controlled Vital Signs Assessment: post-procedure vital signs reviewed and stable Respiratory status: patient on ventilator - see flowsheet for VS and patient remains intubated per anesthesia plan Cardiovascular status: stable Anesthetic complications: no   No complications documented.  Last Vitals:  Vitals:   10/18/19 1345 10/18/19 1400  BP: 109/78 114/79  Pulse: 82 82  Resp: 12 12  Temp: (!) 35.5 C (!) 35.5 C  SpO2: 98% 98%    Last Pain:  Vitals:   10/17/19 2100  TempSrc:   PainSc: 0-No pain                 Nolon Nations

## 2019-10-18 NOTE — Progress Notes (Signed)
[facility] BRIEF SICU PROGRESS NOTE  Day of Surgery  S/P Procedure(s) (LRB): CORONARY ARTERY BYPASS GRAFTING (CABG), ON PUMP, TIMES FOUR, USING LEFT INTERAL MAMMARY ATERY AND ENDOSCOPICALLY HARVESTED RIGHT GREATER SAPHENOUS VEIN (N/A) TRANSESOPHAGEAL ECHOCARDIOGRAM (TEE) (N/A)   Extubated uneventfully NSR w/ stable hemodynamics, no drips Breathing comfortably w/ O2 sats 97% on 4 L/min Chest tube output low UOP adequate Labs okay  Plan: Continue routine early postop  Rexene Alberts, MD 10/18/2019 6:00 PM

## 2019-10-18 NOTE — Discharge Summary (Signed)
Physician Discharge Summary       [address]       [place] [address]             [id number]    Patient ID: Jose Bowers MRN: [id number] DOB/AGE: [birth year] 69 y.o.  Admit date: 10/16/2019 Discharge date: 10/22/2019  Admission Diagnoses: 1. Coronary artery disease 2. Angina Pectoris  Discharge Diagnoses:  1. S/p CABG x 4 2. Coronary artery disease 3. History of Essential hypertension 4. History of  Factor V Leiden ([place]) 5. History of Hyperlipidemia 6. History of saddle pulmonary embolus  Consults: None  Procedure (s):  1. Median Sternotomy 2. Extracorporeal circulation 3.   Coronary artery bypass grafting x 4   Left internal mammary artery graft to the LAD  SVG to OM2  Sequential SVG to distal RCA and PDA  4.   Endoscopic vein harvest from the right leg by Dr. Cyndia Bent on 10/18/2019.  History of Presenting Illness: The patient is a 69 year old gentleman with a history of hypertension, hyperlipidemia, factor V Leiden deficiency status post DVT and saddle pulmonary embolus in 2019 on chronic Xarelto, and a strong family history of heart disease who presented with a history of upper abdominal and lower chest burning discomfort that he thought was GERD.  It has occurred every day usually with exertion but only after he has eaten.  He took proton pump inhibitors with no improvement.  The discomfort resolved with rest.  He has had some exertional shortness of breath and fatigue.  He had an exercise Myoview on 10/04/2019 but did not reach target heart rate on the treadmill and was converted to a pharmacologic study.  He did not reach the target heart rate but had 1.5 mm of horizontal ST depression in the inferior and anterolateral leads with chest pain symptoms.  There was a reversible defect in the apical anterior wall and apex consistent with LAD ischemia.  Ejection fraction was 49%.  He subsequent underwent cardiac catheterization today showing severe  three-vessel coronary disease with 99% heavily calcified proximal LAD stenosis.  There is also a heavily calcified 90% stenosis in the proximal left circumflex.  There is an 80% stenosis at the bifurcation into the posterior descending and posterior lateral branches with 90% stenosis of the ostium of the PDA.  LVEF was estimated 45% with mid apical anterior hypokinesis.  He is a non-smoker.  He has family history of heart disease with his father dying of suspected MI in his 15s and a brother who had CABG in his 27s.  His wife is here with him today.  Dr. Cyndia Bent agreed that coronary artery bypass graft surgery is the best treatment event further ischemia and infarction and improve his quality of life.  Dr. Cyndia Bent reviewed the cardiac catheterization findings with the patient and his wife and answered their questions. Dr. Cyndia Bent  discussed the operative procedure with the patient and his wife including alternatives, benefits and risks. Patient agreed to proceed with surgery. Pre operative carotid duplex US showed no significant internal carotid artery stenosis bilaterally. He underwent a CABG x on 10/18/2019.  Brief Hospital Course:  The patient was extubated the evening of surgery without difficulty. He/she remained afebrile and hemodynamically stable. He was weaned off Nitro drip. Gordy Councilman, a line, chest tubes, and foley were removed early in the post operative course. Lopressor was started and titrated accordingly. He was volume over loaded and diuresed.  He was weaned off the insulin drip.  The patient's HGA1C  pre op was 5.9. He will need follow up with his medical doctor after discharge. The patient was felt surgically stable for transfer from the ICU to [facility] for further convalescence POD2. He continues to progress with cardiac rehab. He was ambulating on room air. He has been tolerating a diet and has had a bowel movement. Epicardial pacing wires were removed on 10/21/19. Chest tube sutures will be  removed in the office after discharge. The patient is felt surgically stable for discharge today.   Latest Vital Signs: Blood pressure 123/78, pulse 87, temperature 99 F (37.2 C), temperature source Oral, resp. rate 18, height 5\' 10"  (1.778 m), weight 97.9 kg, SpO2 96 %.  Physical Exam:  General appearance:alert, cooperative and no distress Neurologic:intact Heart:regular rate and rhythm Lungs:breath sounds are clear Abdomen:Soft and nontender Extremities:All warm and well-perfused. No peripheral edema. The right lower extremity EVH incisions are all intact and dry. Wound: Sternotomy incision is dry and well approximated.  Discharge Condition: Stable and discharged to home.  Recent laboratory studies:  Lab Results  Component Value Date   WBC 7.8 10/22/2019   HGB 12.1 (L) 10/22/2019   HCT 37.5 (L) 10/22/2019   MCV 95.7 10/22/2019   PLT 205 10/22/2019   Lab Results  Component Value Date   NA 140 10/22/2019   K 3.9 10/22/2019   CL 106 10/22/2019   CO2 26 10/22/2019   CREATININE 1.05 10/22/2019   GLUCOSE 113 (H) 10/22/2019      Diagnostic Studies: DG Chest 2 View  Addendum Date: 10/17/2019   ADDENDUM REPORT: 10/17/2019 12:01 ADDENDUM: Lateral view added to order for frontal portable chest obtained earlier in the day. Heart again noted to be borderline prominent. Lungs clear. No bony abnormality appreciable on lateral view. Electronically Signed   By: Lowella Grip III M.D.   On: 10/17/2019 12:01   Result Date: 10/17/2019 CLINICAL DATA:  Preoperative assessment for cardiac surgery. Coronary artery disease. EXAM: PORTABLE CHEST 1 VIEW COMPARISON:  March 19, 2017 FINDINGS: The lungs are clear. Heart is mildly enlarged with pulmonary vascularity normal. No adenopathy. No bone lesions. IMPRESSION: Mild cardiac enlargement.  Lungs clear. Electronically Signed: By: Lowella Grip III M.D. On: 10/17/2019 10:42   CARDIAC CATHETERIZATION  Result Date: 10/16/2019  --  LEFT VENTRICULAR HEMODYNAMICS & VENTRICULOGRAPHY  There is mild left ventricular systolic dysfunction.  LV end diastolic pressure is normal.  The left ventricular ejection fraction is 45-50% by visual estimate.  -- CORONARY ANGIOGRAPHY  Prox LAD to Mid LAD lesion is 99% stenosed with 50% stenosed side branch in 1st Diag.  Prox Cx lesion is 90% stenosed.  Dist RCA lesion is 80% stenosed with 90% stenosed side branch in RPDA.  SUMMARY  Severe heavily calcified multivessel disease:  Heavily calcified proximal LAD subtotal occlusion (99%) lesion beginning just prior to major SP trunk, terminating after 1st Diag --> remainder the LAD is relatively free of disease with TIMI I flow and competitive flow from right left collaterals to the apex  Heavily calcified very focal 90% eccentric stenosis in the proximal LCx--LCx gives off 2 major Mrg branches and a posterior AV groove branch with 2 small PL branches--free of disease.  Heavily calcified dominant RCA with diffuse 30% mid to distal disease, and 80% focal stenosis just prior to RPAV continuing into the RPDA with 90% ostial PDA lesion.  Mildly reduced LVEF of roughly 45% with mid to apical anterior hypokinesis RECOMMENDATION  Admit under Dr. Newman Nickels service for medication optimization and CVTS  consultation  He will be started on IV Heparin while we continue to hold his Xarelto.  Increase to Max dose Atorvastatin & adjust BP medications Glenetta Hew, MD   DG Chest [facility] 1 View  Result Date: 10/20/2019 CLINICAL DATA:  Status post CABG. EXAM: PORTABLE CHEST 1 VIEW COMPARISON:  10/19/2019 FINDINGS: Swan-Ganz catheter, left chest tube and mediastinal drains have been removed. Heart size and mediastinal contours are stable. No pneumothorax. Stable bibasilar atelectasis and probable small bilateral pleural effusions. No pulmonary edema. IMPRESSION: No pneumothorax. Stable bibasilar atelectasis and probable small bilateral pleural effusions. Electronically  Signed   By: Aletta Edouard M.D.   On: 10/20/2019 09:51   DG Chest Port 1 View  Result Date: 10/19/2019 CLINICAL DATA:  Chest tube.  Open-heart surgery. EXAM: PORTABLE CHEST 1 VIEW COMPARISON:  10/18/2019. FINDINGS: Interim removal of endotracheal tube and NG tube. Swan-Ganz catheter, mediastinal drainage catheter, left chest tube in stable position. No pneumothorax. Prior CABG. Stable cardiomegaly. Low lung volumes with mild bibasilar atelectasis again noted. Tiny left pleural effusion cannot be excluded. IMPRESSION: 1. Interim removal of endotracheal tube and NG tube. Swan-Ganz catheter, mediastinal drainage catheter, left chest tube in stable position. No pneumothorax. 2.  Prior CABG.  Stable cardiomegaly. 3. Low lung volumes with mild bibasilar atelectasis again noted. Tiny left pleural effusion cannot be excluded. Electronically Signed   By: Marcello Moores  Register   On: 10/19/2019 07:25   DG Chest Port 1 View  Result Date: 10/18/2019 CLINICAL DATA:  Hypoxia. Status post coronary artery bypass grafting. EXAM: PORTABLE CHEST 1 VIEW COMPARISON:  October 17, 2019 FINDINGS: Endotracheal tube tip is 4.8 cm above the carina. Nasogastric tube tip and side port are below the diaphragm; the side port is seen in the stomach. Swan-Ganz catheter tip is in the main pulmonary outflow tract. There is a mediastinal drain as well as a left chest tube. No evident pneumothorax. There is mild left base atelectasis. Lungs otherwise are clear. Heart is borderline prominent with pulmonary vascularity normal. Status post coronary artery bypass grafting. No adenopathy. No bone lesions. IMPRESSION: Tube and catheter positions as described without pneumothorax. Slight left base atelectasis. Lungs elsewhere clear. Stable cardiac silhouette. Status post coronary artery bypass grafting. Electronically Signed   By: Lowella Grip III M.D.   On: 10/18/2019 13:48   DG Chest Port 1 View  Result Date: 10/17/2019 CLINICAL DATA:  Preoperative  assessment for cardiac surgery. Coronary artery disease. EXAM: PORTABLE CHEST 1 VIEW COMPARISON:  March 19, 2017 FINDINGS: The lungs are clear. Heart is mildly enlarged with pulmonary vascularity normal. No adenopathy. No bone lesions. IMPRESSION: Mild cardiac enlargement.  Lungs clear. Electronically Signed   By: Lowella Grip III M.D.   On: 10/17/2019 10:42   MYOCARDIAL PERFUSION IMAGING  Result Date: 10/04/2019  Nuclear stress EF: 49%.  The left ventricular ejection fraction is mildly decreased (45-54%).  Horizontal ST segment depression ST segment depression of 1.5 mm was noted during stress in the II, III, aVF, V5 and V6 leads, beginning at 7 minutes of stress, and returning to baseline after less than 1 minute of recovery.  Findings consistent with ischemia.  This is a high risk study.  1. Initial ECG treadmill stress did not reach target HR and was converted to pharmacological study. Despite not reaching target HR, the patient had 1.5 mm horizontal ST depression in inferior leads (II, III, AVF) and anterolateral leads (V5-6) and symptoms of chest pain. 2. On MPI imaging, there is a medium size (  10-12% of the LV), moderate perfusion defect present on stress imaging in the apical anterior, and apex consistent with ischemia in the LAD distrubution. 3. LVEF is normal for this modality, 49%. 4. Average exercise capacity (8:11 min:s; 7.0 METS). 5. Overall, this is a high risk study concerning for ischemia.   ECHOCARDIOGRAM COMPLETE  Result Date: 10/16/2019    ECHOCARDIOGRAM REPORT   Patient Name:   Jose Bowers Date of Exam: 10/16/2019 Medical Rec #:  [id number]        Height:       70.0 in Accession #:    [id number]       Weight:       217.5 lb Date of Birth:  24-Dec-1950        BSA:          2.163 m Patient Age:    86 years         BP:           132/79 mmHg Patient Gender: M                HR:           79 bpm. Exam Location:  Inpatient Procedure: 2D Echo, Intracardiac Opacification Agent,  Cardiac Doppler and Color            Doppler Indications:    Coronary artery disease (CAD)  History:        Patient has prior history of Echocardiogram examinations, most                 recent 05/19/2018. CAD and Angina; Risk Factors:Hypertension and                 Dyslipidemia. H/o DVT and PE.  Sonographer:    Clayton Lefort RDCS (AE) Referring Phys: [id number] Gaye Pollack  Sonographer Comments: Technically difficult study due to poor echo windows and patient is morbidly obese. Image acquisition challenging due to patient body habitus. IMPRESSIONS  1. Left ventricular ejection fraction, by estimation, is 55 to 60%. The left ventricle has normal function. The left ventricle has no regional wall motion abnormalities. There is mild left ventricular hypertrophy of the basal-septal segment. Left ventricular diastolic parameters are consistent with Grade I diastolic dysfunction (impaired relaxation).  2. Right ventricular systolic function is normal. The right ventricular size is normal. Tricuspid regurgitation signal is inadequate for assessing PA pressure.  3. Left atrial size was mildly dilated.  4. The mitral valve is normal in structure. No evidence of mitral valve regurgitation. No evidence of mitral stenosis.  5. The aortic valve is normal in structure. Aortic valve regurgitation is not visualized. No aortic stenosis is present.  6. Aortic dilatation noted. There is borderline dilatation of the aortic root measuring 38 mm.  7. The inferior vena cava is normal in size with greater than 50% respiratory variability, suggesting right atrial pressure of 3 mmHg. Comparison(s): No significant change from prior study. Prior images reviewed side by side. FINDINGS  Left Ventricle: Left ventricular ejection fraction, by estimation, is 55 to 60%. The left ventricle has normal function. The left ventricle has no regional wall motion abnormalities. Definity contrast agent was given IV to delineate the left ventricular  endocardial  borders. The left ventricular internal cavity size was normal in size. There is mild left ventricular hypertrophy of the basal-septal segment. Left ventricular diastolic parameters are consistent with Grade I diastolic dysfunction (impaired relaxation). Normal left ventricular filling pressure. Right Ventricle: The right ventricular size is  normal. No increase in right ventricular wall thickness. Right ventricular systolic function is normal. Tricuspid regurgitation signal is inadequate for assessing PA pressure. Left Atrium: Left atrial size was mildly dilated. Right Atrium: Right atrial size was normal in size. Pericardium: There is no evidence of pericardial effusion. Mitral Valve: The mitral valve is normal in structure. Normal mobility of the mitral valve leaflets. No evidence of mitral valve regurgitation. No evidence of mitral valve stenosis. MV peak gradient, 2.6 mmHg. The mean mitral valve gradient is 1.0 mmHg. Tricuspid Valve: The tricuspid valve is normal in structure. Tricuspid valve regurgitation is not demonstrated. No evidence of tricuspid stenosis. Aortic Valve: The aortic valve is normal in structure. Aortic valve regurgitation is not visualized. No aortic stenosis is present. Aortic valve mean gradient measures 2.0 mmHg. Aortic valve peak gradient measures 3.9 mmHg. Aortic valve area, by VTI measures 3.19 cm. Pulmonic Valve: The pulmonic valve was normal in structure. Pulmonic valve regurgitation is not visualized. No evidence of pulmonic stenosis. Aorta: Aortic dilatation noted. There is borderline dilatation of the aortic root measuring 38 mm. Venous: The inferior vena cava is normal in size with greater than 50% respiratory variability, suggesting right atrial pressure of 3 mmHg. IAS/Shunts: No atrial level shunt detected by color flow Doppler.  LEFT VENTRICLE PLAX 2D LVIDd:         4.50 cm  Diastology LVIDs:         3.40 cm  LV e' lateral:   6.74 cm/s LV PW:         1.10 cm  LV E/e' lateral:  7.5 LV IVS:        1.30 cm  LV e' medial:    6.64 cm/s LVOT diam:     2.30 cm  LV E/e' medial:  7.6 LV SV:         58 LV SV Index:   27 LVOT Area:     4.15 cm  RIGHT VENTRICLE RV Basal diam:  3.30 cm RV S prime:     14.40 cm/s TAPSE (M-mode): 2.3 cm LEFT ATRIUM             Index       RIGHT ATRIUM           Index LA diam:        4.30 cm 1.99 cm/m  RA Area:     17.60 cm LA Vol (A2C):   44.7 ml 20.67 ml/m RA Volume:   44.80 ml  20.71 ml/m LA Vol (A4C):   45.8 ml 21.17 ml/m LA Biplane Vol: 46.8 ml 21.64 ml/m  AORTIC VALVE AV Area (Vmax):    3.03 cm AV Area (Vmean):   2.83 cm AV Area (VTI):     3.19 cm AV Vmax:           99.10 cm/s AV Vmean:          71.000 cm/s AV VTI:            0.181 m AV Peak Grad:      3.9 mmHg AV Mean Grad:      2.0 mmHg LVOT Vmax:         72.30 cm/s LVOT Vmean:        48.300 cm/s LVOT VTI:          0.139 m LVOT/AV VTI ratio: 0.77  AORTA Ao Root diam: 3.80 cm Ao Asc diam:  3.60 cm MITRAL VALVE MV Area (PHT): 2.45 cm    SHUNTS MV Peak grad:  2.6 mmHg    Systemic VTI:  0.14 m MV Mean grad:  1.0 mmHg    Systemic Diam: 2.30 cm MV Vmax:       0.81 m/s MV Vmean:      46.1 cm/s MV Decel Time: 310 msec MV E velocity: 50.60 cm/s MV A velocity: 81.00 cm/s MV E/A ratio:  0.62 Mihai Croitoru MD Electronically signed by Sanda Klein MD Signature Date/Time: 10/16/2019/5:50:02 PM    Final    ECHO INTRAOPERATIVE TEE  Result Date: 10/18/2019  *INTRAOPERATIVE TRANSESOPHAGEAL REPORT *  Patient Name:   Jose Bowers Date of Exam: 10/18/2019 Medical Rec #:  [id number]        Height:       70.0 in Accession #:    [id number]       Weight:       216.5 lb Date of Birth:  [birth year]        BSA:          2.16 m Patient Age:    84 years         BP:           133/69 mmHg Patient Gender: M                HR:           65 bpm. Exam Location:  Anesthesiology Transesophogeal exam was perform intraoperatively during surgical procedure. Patient was closely monitored under general anesthesia during the entirety of  examination. Indications:     Coronary artery disease (CAD) Sonographer:     Darlina Sicilian RDCS Performing Phys: [id number] Fernande Boyden BARTLE Diagnosing Phys: Nolon Nations MD Complications: No known complications during this procedure. POST-OP IMPRESSIONS - Left Ventricle: The left ventricle is essentially unchanged from pre-bypass. - Right Ventricle: The right ventricle appears unchanged from pre-bypass. - Aorta: The aorta appears unchanged from pre-bypass. - Left Atrium: The left atrium appears unchanged from pre-bypass. - Left Atrial Appendage: The left atrial appendage appears unchanged from pre-bypass. - Aortic Valve: The aortic valve appears unchanged from pre-bypass. - Mitral Valve: The mitral valve appears essentially unchanged from pre-bypass. - Tricuspid Valve: The tricuspid valve appears essentially unchanged from pre-bypass. - Interatrial Septum: The interatrial septum appears unchanged from pre-bypass. - Interventricular Septum: The interventricular septum appears unchanged from pre-bypass. - Pericardium: The pericardium appears unchanged from pre-bypass. PRE-OP FINDINGS  Left Ventricle: The left ventricle has normal systolic function, with an ejection fraction of 60-65%. The cavity size was normal. There is mildly increased left ventricular wall thickness. Right Ventricle: The right ventricle has normal systolic function. The cavity was normal. There is no increase in right ventricular wall thickness. Left Atrium: Left atrial size was normal in size.  Interatrial Septum: No atrial level shunt detected by color flow Doppler. Pericardium: There is no evidence of pericardial effusion. Mitral Valve: The mitral valve is normal in structure. No thickening of the mitral valve leaflet. Mitral valve regurgitation is trivial by color flow Doppler. There is no evidence of mitral valve vegetation. Tricuspid Valve: The tricuspid valve was normal in structure. Tricuspid valve regurgitation is mild by color flow Doppler.  The jet is directed toward the atrial septum. There is no evidence of tricuspid valve vegetation. Aortic Valve: The aortic valve is tricuspid Aortic valve regurgitation was not visualized by color flow Doppler. There is no evidence of aortic valve stenosis. There is no stenosis of the aortic valve. There is no evidence of a vegetation on the aortic valve. Pulmonic Valve: The  pulmonic valve was normal in structure. Pulmonic valve regurgitation is trivial by color flow Doppler. Aorta: The aortic arch are normal in size and structure. +-------------+--------++ AORTIC VALVE          +-------------+--------++ AV Mean Grad:2.0 mmHg +-------------+--------++  Nolon Nations MD Electronically signed by Nolon Nations MD Signature Date/Time: 10/18/2019/3:24:43 PM    Final    VAS US DOPPLER PRE CABG  Result Date: 10/17/2019 PREOPERATIVE VASCULAR EVALUATION  Indications:      Pre-CABG. Risk Factors:     Hypertension, hyperlipidemia. Comparison Study: No prior studies. Performing Technologist: Darlin Coco  Examination Guidelines: A complete evaluation includes B-mode imaging, spectral Doppler, color Doppler, and power Doppler as needed of all accessible portions of each vessel. Bilateral testing is considered an integral part of a complete examination. Limited examinations for reoccurring indications may be performed as noted.  Right Carotid Findings: +----------+--------+--------+--------+--------+------------------+           PSV cm/sEDV cm/sStenosisDescribeComments           +----------+--------+--------+--------+--------+------------------+ CCA Prox  93      11                      intimal thickening +----------+--------+--------+--------+--------+------------------+ CCA Distal94      14                      intimal thickening +----------+--------+--------+--------+--------+------------------+ ICA Prox  59      13      1-39%                               +----------+--------+--------+--------+--------+------------------+ ICA Distal56      16                                         +----------+--------+--------+--------+--------+------------------+ ECA       109     11                                         +----------+--------+--------+--------+--------+------------------+ Portions of this table do not appear on this page. +----------+--------+-------+----------------+------------+           PSV cm/sEDV cmsDescribe        Arm Pressure +----------+--------+-------+----------------+------------+ Subclavian84             Multiphasic, WNL             +----------+--------+-------+----------------+------------+ +---------+--------+--+--------+-+---------+ VertebralPSV cm/s36EDV cm/s8Antegrade +---------+--------+--+--------+-+---------+ Left Carotid Findings: +----------+--------+--------+--------+--------+------------------+           PSV cm/sEDV cm/sStenosisDescribeComments           +----------+--------+--------+--------+--------+------------------+ CCA Prox  121     20                      intimal thickening +----------+--------+--------+--------+--------+------------------+ CCA Distal95      17                      intimal thickening +----------+--------+--------+--------+--------+------------------+ ICA Prox  40      13      1-39%                              +----------+--------+--------+--------+--------+------------------+ ICA Distal67  23                                         +----------+--------+--------+--------+--------+------------------+ ECA       93                                                 +----------+--------+--------+--------+--------+------------------+ +----------+--------+--------+----------------+------------+ SubclavianPSV cm/sEDV cm/sDescribe        Arm Pressure +----------+--------+--------+----------------+------------+           159              Multiphasic, WNL             +----------+--------+--------+----------------+------------+ +---------+--------+--+--------+--+---------+ VertebralPSV cm/s42EDV cm/s12Antegrade +---------+--------+--+--------+--+---------+  ABI Findings: +--------+------------------+-----+---------+--------+ Right   Rt Pressure (mmHg)IndexWaveform Comment  +--------+------------------+-----+---------+--------+ [id number]                    triphasic         +--------+------------------+-----+---------+--------+ PTA                            triphasic         +--------+------------------+-----+---------+--------+ DP                             triphasic         +--------+------------------+-----+---------+--------+ +--------+------------------+-----+---------+-------+ Left    Lt Pressure (mmHg)IndexWaveform Comment +--------+------------------+-----+---------+-------+ [id number]                    triphasic        +--------+------------------+-----+---------+-------+ PTA                            triphasic        +--------+------------------+-----+---------+-------+ DP                             triphasic        +--------+------------------+-----+---------+-------+  Right Doppler Findings: +--------+--------+-----+---------+--------+ Site    PressureIndexDoppler  Comments +--------+--------+-----+---------+--------+ [id number]          triphasic         +--------+--------+-----+---------+--------+ Radial               triphasic         +--------+--------+-----+---------+--------+ Ulnar                triphasic         +--------+--------+-----+---------+--------+  Left Doppler Findings: +--------+--------+-----+---------+--------+ Site    PressureIndexDoppler  Comments +--------+--------+-----+---------+--------+ [id number]          triphasic         +--------+--------+-----+---------+--------+ Radial               triphasic          +--------+--------+-----+---------+--------+ Ulnar                triphasic         +--------+--------+-----+---------+--------+  Summary: Right Carotid: Velocities in the right ICA are consistent with a 1-39% stenosis. Left Carotid: Velocities in the left ICA are consistent with a 1-39% stenosis. Vertebrals:  Bilateral vertebral arteries demonstrate antegrade flow. Subclavians: Normal flow hemodynamics  were seen in bilateral subclavian              arteries. Right Upper Extremity: Doppler waveforms remain within normal limits with right radial compression. Doppler waveforms remain within normal limits with right ulnar compression. Left Upper Extremity: Doppler waveforms remain within normal limits with left radial compression. Doppler waveforms remain within normal limits with left ulnar compression.  Electronically signed by Harold Barban MD on 10/17/2019 at 4:54:38 PM.    Final          Discharge Medications: Allergies as of 10/22/2019   No Known Allergies     Medication List    TAKE these medications   amoxicillin 500 MG capsule Commonly known as: AMOXIL Take 2,000 mg by mouth See admin instructions. Take 2000 mg by mouth prior to any dental work done.   aspirin 81 MG EC tablet Take 1 tablet (81 mg total) by mouth daily. Swallow whole.   atorvastatin 80 MG tablet Commonly known as: LIPITOR Take 1 tablet (80 mg total) by mouth daily at 6 PM. What changed:   medication strength  how much to take  when to take this  Another medication with the same name was removed. Continue taking this medication, and follow the directions you see here.   fluocinonide cream 0.05 % Commonly known as: LIDEX Apply 1 application topically daily as needed (itching/rash (Grover's disease)).   losartan-hydrochlorothiazide 50-12.5 MG tablet Commonly known as: HYZAAR Take 1 tablet by mouth daily.   metoprolol tartrate 25 MG tablet Commonly known as: LOPRESSOR Take 1 tablet (25 mg total) by  mouth 2 (two) times daily.   nitroGLYCERIN 0.4 MG SL tablet Commonly known as: NITROSTAT Place 1 tablet (0.4 mg total) under the tongue every 5 (five) minutes as needed for chest pain.   pantoprazole 40 MG tablet Commonly known as: PROTONIX Take 40 mg by mouth daily.   traMADol 50 MG tablet Commonly known as: ULTRAM Take 1 tablet (50 mg total) by mouth every 4 (four) hours as needed for up to 5 days for moderate pain.   Xarelto 20 MG Tabs tablet Generic drug: rivaroxaban Take 20 mg by mouth daily.      The patient has been discharged on:   1.Beta Blocker:  Yes [ x  ]                              No   [   ]                              If No, reason:  2.Ace Inhibitor/ARB: Yes [  x ]                                     No  [    ]                                     If No, reason:  3.Statin:   Yes [x   ]                  No  [   ]                  If  No, reason:  4.Ecasa:  Yes  [ x  ]                  No   [   ]                  If No, reason:  Follow Up Appointments:  Follow-up Information    Gaye Pollack, MD. Go on 11/21/2019.   Specialty: Cardiothoracic Surgery Why: Follow up with Dr. Cyndia Bent  Chest X-ray to be taken by [facility] (which is in the same building as Dr. Vivi Martens office) on Wednesday, 11/21/19 at2:00pm. Appointment time with Dr. Cyndia Bent is 2:30pm.  Contact information: [place] [address] [phone]        Wenda Low, MD Follow up.   Specialty: Internal Medicine Why: Call for a follow up appointment regarding furthers surveillance of HGA1C 5.9 (pre diabetes) Contact information: [address] [place] [address] [phone]        Donato Heinz, MD Follow up on 11/16/2019.   Specialties: Cardiology, Radiology Why: Please arrive 15 minutes early for your 10:40am post-hospital cardiology follow-up appointment Contact information: [address] [place] [address] [phone]        [facility] [place]. Go on 10/30/2019.   Specialty: Cardiothoracic Surgery Why: Your appontment is at 11:00am for suture removal.  Contact information: [address] [place], [place]              Signed: Joline Maxcy 10/22/2019, 9:52 AM

## 2019-10-18 NOTE — Brief Op Note (Signed)
10/16/2019 - 10/18/2019  9:49 AM  PATIENT:  Jose Bowers  69 y.o. male  PRE-OPERATIVE DIAGNOSIS:  CAD  POST-OPERATIVE DIAGNOSIS:  CAD  PROCEDURE:  TRANSESOPHAGEAL ECHOCARDIOGRAM (TEE),  CORONARY ARTERY BYPASS GRAFTING (CABG) x 4 (LIMA to LAD, SVG to OM, SVG SEQUENTIALLY to PDA and PLB) with EVH from [place],  RIGHT EVH HARVEST TIME: 40 minutes;PREP TIME:18 min  SURGEON:  Surgeon(s) and Role:    Gaye Pollack, MD - Primary  PHYSICIAN ASSISTANT: Lars Pinks PA-C  ASSISTANTS: Dineen Kid RNFA  ANESTHESIA:   general  EBL:  Per anesthesia and perfusion record  DRAINS: Chest tubes placed in the pleural and mediastinal spaces  COUNTS CORRECT:  YES  DICTATION: .Dragon Dictation  PLAN OF CARE: Admit to inpatient   PATIENT DISPOSITION:  ICU - intubated and hemodynamically stable.   Delay start of Pharmacological VTE agent (>24hrs) due to surgical blood loss or risk of bleeding: yes  BASELINE WEIGHT: 98.2 kg

## 2019-10-18 NOTE — Progress Notes (Signed)
Echocardiogram Echocardiogram Transesophageal has been performed.  Darlina Sicilian M 10/18/2019, 9:01 AM

## 2019-10-18 NOTE — Addendum Note (Signed)
Addendum  created 10/18/19 2122 by Nolon Nations, MD   Clinical Note Signed, Intraprocedure Blocks edited

## 2019-10-18 NOTE — Anesthesia Procedure Notes (Signed)
Arterial Line Insertion Start/End8/07/2019 6:55 AM, 10/18/2019 7:10 AM Performed by: Harden Mo, CRNA, CRNA  Preanesthetic checklist: patient identified, IV checked, site marked, risks and benefits discussed, surgical consent, monitors and equipment checked, pre-op evaluation and anesthesia consent Lidocaine 1% used for infiltration Left, radial was placed Catheter size: 20 G Hand hygiene performed  and maximum sterile barriers used  Allen's test indicative of satisfactory collateral circulation Attempts: 1 Procedure performed without using ultrasound guided technique. Ultrasound Notes:anatomy identified, needle tip was noted to be adjacent to the nerve/plexus identified and no ultrasound evidence of intravascular and/or intraneural injection Following insertion, dressing applied and Biopatch. Post procedure assessment: normal  Patient tolerated the procedure well with no immediate complications.

## 2019-10-18 NOTE — Transfer of Care (Signed)
Immediate Anesthesia Transfer of Care Note  Patient: Jose Bowers  Procedure(s) Performed: CORONARY ARTERY BYPASS GRAFTING (CABG), ON PUMP, TIMES FOUR, USING LEFT INTERAL MAMMARY ATERY AND ENDOSCOPICALLY HARVESTED RIGHT GREATER SAPHENOUS VEIN (N/A Chest) TRANSESOPHAGEAL ECHOCARDIOGRAM (TEE) (N/A )  Patient Location: ICU  Anesthesia Type:General  Level of Consciousness: sedated, unresponsive and Patient remains intubated per anesthesia plan  Airway & Oxygen Therapy: Patient remains intubated per anesthesia plan and Patient placed on Ventilator (see vital sign flow sheet for setting)  Post-op Assessment: Report given to RN and Post -op Vital signs reviewed and stable  Post vital signs: Reviewed and stable  Last Vitals:  Vitals Value Taken Time  BP    Temp    Pulse 83 10/18/19 1323  Resp 14 10/18/19 1323  SpO2 99 % 10/18/19 1323  Vitals shown include unvalidated device data.  Last Pain:  Vitals:   10/17/19 2100  TempSrc:   PainSc: 0-No pain      Patients Stated Pain Goal: 0 (30/07/62 2633)  Complications: No complications documented.

## 2019-10-18 NOTE — Anesthesia Procedure Notes (Addendum)
Central Venous Catheter Insertion Performed by: Nolon Nations, MD, anesthesiologist Start/End8/07/2019 7:15 AM, 10/18/2019 7:25 AM Patient location: Pre-op. Preanesthetic checklist: patient identified, IV checked, site marked, risks and benefits discussed, surgical consent, monitors and equipment checked, pre-op evaluation, timeout performed and anesthesia consent Position: Trendelenburg Lidocaine 1% used for infiltration and patient sedated Hand hygiene performed  and maximum sterile barriers used  Catheter size: 9 Fr Central line was placed.MAC introducer Procedure performed using ultrasound guided technique. Ultrasound Notes:anatomy identified, needle tip was noted to be adjacent to the nerve/plexus identified, no ultrasound evidence of intravascular and/or intraneural injection and image(s) printed for medical record Attempts: 1 Following insertion, line sutured, dressing applied and Biopatch. Post procedure assessment: blood return through all ports, free fluid flow and no air  Patient tolerated the procedure well with no immediate complications.

## 2019-10-18 NOTE — Discharge Instructions (Signed)
Discharge Instructions:  1. You may shower, please wash incisions daily with soap and water and keep dry.  If you wish to cover wounds with dressing you may do so but please keep clean and change daily.  No tub baths or swimming until incisions have completely healed.  If your incisions become red or develop any drainage please call our office at [phone]  2. No Driving until cleared by Dr. Vivi Martens office and you are no longer using narcotic pain medications  3. Monitor your weight daily.. Please use the same scale and weigh at same time... If you gain 5-10 lbs in 48 hours with associated lower extremity swelling, please contact our office at [phone]  4. Fever of 101.5 for at least 24 hours with no source, please contact our office at [phone]   Prediabetes Eating Plan Prediabetes is a condition that causes blood sugar (glucose) levels to be higher than normal. This increases the risk for developing diabetes. In order to prevent diabetes from developing, your health care provider may recommend a diet and other lifestyle changes to help you:  Control your blood glucose levels.  Improve your cholesterol levels.  Manage your blood pressure. Your health care provider may recommend working with a diet and nutrition specialist (dietitian) to make a meal plan that is best for you. What are tips for following this plan? Lifestyle  Set weight loss goals with the help of your health care team. It is recommended that most people with prediabetes lose 7% of their current body weight.  Exercise for at least 30 minutes at least 5 days a week.  Attend a support group or seek ongoing support from a mental health counselor.  Take over-the-counter and prescription medicines only as told by your health care provider. Reading food labels  Read food labels to check the amount of fat, salt (sodium), and sugar in prepackaged foods. Avoid foods that have: ? Saturated fats. ? Trans  fats. ? Added sugars.  Avoid foods that have more than 300 milligrams (mg) of sodium per serving. Limit your daily sodium intake to less than 2,300 mg each day. Shopping  Avoid buying pre-made and processed foods. Cooking  Cook with olive oil. Do not use butter, lard, or ghee.  Bake, broil, grill, or boil foods. Avoid frying. Meal planning   Work with your dietitian to develop an eating plan that is right for you. This may include: ? Tracking how many calories you take in. Use a food diary, notebook, or mobile application to track what you eat at each meal. ? Using the glycemic index (GI) to plan your meals. The index tells you how quickly a food will raise your blood glucose. Choose low-GI foods. These foods take a longer time to raise blood glucose.  Consider following a Mediterranean diet. This diet includes: ? Several servings each day of fresh fruits and vegetables. ? Eating fish at least twice a week. ? Several servings each day of whole grains, beans, nuts, and seeds. ? Using olive oil instead of other fats. ? Moderate alcohol consumption. ? Eating small amounts of red meat and whole-fat dairy.  If you have high blood pressure, you may need to limit your sodium intake or follow a diet such as the DASH eating plan. DASH is an eating plan that aims to lower high blood pressure. What foods are recommended? The items listed below may not be a complete list. Talk with your dietitian about what dietary choices are best for you. Grains  Whole grains, such as whole-wheat or whole-grain breads, crackers, cereals, and pasta. Unsweetened oatmeal. Bulgur. Barley. Quinoa. Brown rice. Corn or whole-wheat flour tortillas or taco shells. Vegetables Lettuce. Spinach. Peas. Beets. Cauliflower. Cabbage. Broccoli. Carrots. Tomatoes. Squash. Eggplant. Herbs. Peppers. Onions. Cucumbers. Brussels sprouts. Fruits Berries. Bananas. Apples. Oranges. Grapes. Papaya. Mango. Pomegranate. Kiwi.  Grapefruit. Cherries. Meats and other protein foods Seafood. Poultry without skin. Lean cuts of pork and beef. Tofu. Eggs. Nuts. Beans. Dairy Low-fat or fat-free dairy products, such as yogurt, cottage cheese, and cheese. Beverages Water. Tea. Coffee. Sugar-free or diet soda. Seltzer water. Lowfat or no-fat milk. Milk alternatives, such as soy or almond milk. Fats and oils Olive oil. Canola oil. Sunflower oil. Grapeseed oil. Avocado. Walnuts. Sweets and desserts Sugar-free or low-fat pudding. Sugar-free or low-fat ice cream and other frozen treats. Seasoning and other foods Herbs. Sodium-free spices. Mustard. Relish. Low-fat, low-sugar ketchup. Low-fat, low-sugar barbecue sauce. Low-fat or fat-free mayonnaise. What foods are not recommended? The items listed below may not be a complete list. Talk with your dietitian about what dietary choices are best for you. Grains Refined white flour and flour products, such as bread, pasta, snack foods, and cereals. Vegetables Canned vegetables. Frozen vegetables with butter or cream sauce. Fruits Fruits canned with syrup. Meats and other protein foods Fatty cuts of meat. Poultry with skin. Breaded or fried meat. Processed meats. Dairy Full-fat yogurt, cheese, or milk. Beverages Sweetened drinks, such as sweet iced tea and soda. Fats and oils Butter. Lard. Ghee. Sweets and desserts Baked goods, such as cake, cupcakes, pastries, cookies, and cheesecake. Seasoning and other foods Spice mixes with added salt. Ketchup. Barbecue sauce. Mayonnaise. Summary  To prevent diabetes from developing, you may need to make diet and other lifestyle changes to help control blood sugar, improve cholesterol levels, and manage your blood pressure.  Set weight loss goals with the help of your health care team. It is recommended that most people with prediabetes lose 7 percent of their current body weight.  Consider following a Mediterranean diet that includes  plenty of fresh fruits and vegetables, whole grains, beans, nuts, seeds, fish, lean meat, low-fat dairy, and healthy oils. This information is not intended to replace advice given to you by your health care provider. Make sure you discuss any questions you have with your health care provider. Document Revised: 06/23/2018 Document Reviewed: 05/05/2016 Elsevier Patient Education  [place].   5. Activity- up as tolerated, please walk at least 3 times per day.  Avoid strenuous activity, no lifting, pushing, or pulling with your arms over 8-10 lbs for a minimum of 6 weeks  6. If any questions or concerns arise, please do not hesitate to contact our office at [phone]

## 2019-10-18 NOTE — Op Note (Signed)
CARDIOVASCULAR SURGERY OPERATIVE NOTE  10/18/2019  Surgeon:  Gaye Pollack, MD  First Assistant: Lars Pinks,  PA-C   Preoperative Diagnosis:  Severe multi-vessel coronary artery disease   Postoperative Diagnosis:  Same   Procedure:  1. Median Sternotomy 2. Extracorporeal circulation 3.   Coronary artery bypass grafting x 4   Left internal mammary artery graft to the LAD  SVG to OM2  Sequential SVG to distal RCA and PDA  4.   Endoscopic vein harvest from the right leg   Anesthesia:  General Endotracheal   Clinical History/Surgical Indication:  This 69 year old gentleman has severe three-vessel coronary disease with heavily calcified high-grade proximal LAD and left circumflex stenoses as well as high-grade stenosis of the distal right coronary artery at the bifurcation of the posterior descending and posterior lateral branches.  His left ventricular ejection fraction was estimated at 45% by catheterization.  Prior echocardiogram in March 2020 showed an ejection fraction of 55 to 60%.  I agree that coronary artery bypass graft surgery is the best treatment event further ischemia and infarction and improve his quality of life.  I reviewed the cardiac catheterization findings with the patient and his wife and answered their questions. I discussed the operative procedure with the patient and his wife including alternatives, benefits and risks; including but not limited to bleeding, blood transfusion, infection, stroke, myocardial infarction, graft failure, heart block requiring a permanent pacemaker, organ dysfunction, and death.  Jose Bowers understands and agrees to proceed.   Preparation:  The patient was seen in the preoperative holding area and the correct patient, correct operation were confirmed with the patient after reviewing the medical record and catheterization. The  consent was signed by me. Preoperative antibiotics were given. A pulmonary arterial line and radial arterial line were placed by the anesthesia team. The patient was taken back to the operating room and positioned supine on the operating room table. After being placed under general endotracheal anesthesia by the anesthesia team a foley catheter was placed. The neck, chest, abdomen, and both legs were prepped with betadine soap and solution and draped in the usual sterile manner. A surgical time-out was taken and the correct patient and operative procedure were confirmed with the nursing and anesthesia staff.   Cardiopulmonary Bypass:  A median sternotomy was performed. There was a large amount of anterior mediastinal fat. The pericardium was opened in the midline. The heart was encased in fat with some fat on the aorta. Right ventricular function appeared normal. The ascending aorta was of normal size and had no palpable plaque. There were no contraindications to aortic cannulation or cross-clamping. The patient was fully systemically heparinized and the ACT was maintained > 400 sec. The proximal aortic arch was cannulated with a 20 F aortic cannula for arterial inflow. Venous cannulation was performed via the right atrial appendage using a two-staged venous cannula. An antegrade cardioplegia/vent cannula was inserted into the mid-ascending aorta. Aortic occlusion was performed with a single cross-clamp. Systemic cooling to 32 degrees Centigrade and topical cooling of the heart with iced saline were used. Hyperkalemic antegrade cold blood cardioplegia was used to induce diastolic arrest and was then given at about 20 minute intervals throughout the period of arrest to maintain myocardial temperature at or below 10 degrees centigrade. A temperature probe was inserted into the interventricular septum and an insulating pad was placed in the pericardium.   Left internal mammary artery harvest:  The left side  of the sternum was retracted using the Rultract  retractor. The left internal mammary artery was harvested as a pedicle graft. All side branches were clipped. It was a medium-sized vessel of good quality with excellent blood flow. It was ligated distally and divided. It was sprayed with topical papaverine solution to prevent vasospasm. There was a large fatty panniculus hanging over the left lateral pericardium.   Endoscopic vein harvest:  The right greater saphenous vein was harvested endoscopically through a 2 cm incision medial to the right knee. It was harvested from the upper thigh to below the knee. It was a medium-sized vein of good quality. The side branches were all ligated with 4-0 silk ties.    Coronary arteries:  The coronary arteries were examined.   LAD:  Large vessel that was heavily diseased proximally with segmental mid vessel disease. The distal vessel had no disease. The diagonal was small.   LCX:  OM1 and OM2 had no distal disease. OM1 was mostly intramyocardial and OM2 was on the surface so it was grafted. They both communicated on cath.  RCA:  Diffusely diseased. The PDA was diseased at its origin but the mid vessel had no disease and was medium caliber. The RCA just beyond the PDA was free of disease and was grafted to supply the PLA which was a smaller vessel.   Grafts:  1. LIMA to the LAD: 2.5 mm. It was sewn end to side using 8-0 prolene continuous suture. 2. SVG to OM2:  2.0 mm. It was sewn end to side using 7-0 prolene continuous suture. 3. Sequential SVG to distal RCA:  2.0 mm. It was sewn sequential side to side using 7-0 prolene continuous suture. 4. Sequential SVG to PDS:  1.75 mm. It was sewn sequential end to side using 7-0 prolene continuous suture.  The proximal vein graft anastomoses were performed to the mid-ascending aorta using continuous 6-0 prolene suture. Graft markers were placed around the proximal anastomoses.   Completion:  The patient was  rewarmed to 37 degrees Centigrade. The clamp was removed from the LIMA pedicle and there was rapid warming of the septum and return of ventricular fibrillation. The crossclamp was removed with a time of 86 minutes. There was spontaneous return of sinus rhythm. The distal and proximal anastomoses were checked for hemostasis. The position of the grafts was satisfactory. Two temporary epicardial pacing wires were placed on the right atrium and two on the right ventricle. The patient was weaned from CPB without difficulty on no inotropes. CPB time was 104 minutes. Cardiac output was 5 LPM. TEE showed normal LV systolic function. Heparin was fully reversed with protamine and the aortic and venous cannulas removed. Hemostasis was achieved. Mediastinal and left pleural drainage tubes were placed. The sternum was closed with double #6 stainless steel wires. The fascia was closed with continuous # 1 vicryl suture. The subcutaneous tissue was closed with 2-0 vicryl continuous suture. The skin was closed with 3-0 vicryl subcuticular suture. All sponge, needle, and instrument counts were reported correct at the end of the case. Dry sterile dressings were placed over the incisions and around the chest tubes which were connected to pleurevac suction. The patient was then transported to the surgical intensive care unit in stable condition.

## 2019-10-18 NOTE — Anesthesia Procedure Notes (Signed)
Procedure Name: Intubation Date/Time: 10/18/2019 7:56 AM Performed by: Harden Mo, CRNA Pre-anesthesia Checklist: Patient identified, Emergency Drugs available, Suction available and Patient being monitored Patient Re-evaluated:Patient Re-evaluated prior to induction Oxygen Delivery Method: Circle System Utilized Preoxygenation: Pre-oxygenation with 100% oxygen Induction Type: IV induction Ventilation: Mask ventilation without difficulty and Oral airway inserted - appropriate to patient size Laryngoscope Size: Sabra Heck and 2 Grade View: Grade I Tube type: Oral Tube size: 8.0 mm Number of attempts: 1 Airway Equipment and Method: Stylet and Oral airway Placement Confirmation: ETT inserted through vocal cords under direct vision,  positive ETCO2 and breath sounds checked- equal and bilateral Secured at: 23 cm Tube secured with: Tape Dental Injury: Teeth and Oropharynx as per pre-operative assessment

## 2019-10-18 NOTE — Procedures (Signed)
Extubation Procedure Note  Patient Details:   Name: Jose Bowers DOB: 1950/11/03 MRN: [id number]   Airway Documentation:    Vent end date: 10/18/19 Vent end time: 1615   Evaluation  O2 sats: stable throughout Complications: No apparent complications Patient did tolerate procedure well. Bilateral Breath Sounds: Clear, Diminished   Yes   Pt was extubated to 4L [place] per SIMV wean protocol and MD order. Pt able to speak afterwards. Pt was suctioned and had a prior cuff leak prior to extubation. Pt NIF was -40 and VC was 0.8L. Pt tolerating well at this time with saturations of 95%. RT will continue to monitor.   Diamante Truszkowski A Nathasha Fiorillo 10/18/2019, 4:19 PM

## 2019-10-19 ENCOUNTER — Encounter (HOSPITAL_COMMUNITY): Payer: Self-pay | Admitting: Surgery

## 2019-10-19 ENCOUNTER — Inpatient Hospital Stay (HOSPITAL_COMMUNITY): Payer: Medicare Other

## 2019-10-19 LAB — BASIC METABOLIC PANEL
Anion gap: 8 (ref 5–15)
Anion gap: 9 (ref 5–15)
BUN: 11 mg/dL (ref 8–23)
BUN: 12 mg/dL (ref 8–23)
CO2: 22 mmol/L (ref 22–32)
CO2: 25 mmol/L (ref 22–32)
Calcium: 8.2 mg/dL — ABNORMAL LOW (ref 8.9–10.3)
Calcium: 8.2 mg/dL — ABNORMAL LOW (ref 8.9–10.3)
Chloride: 106 mmol/L (ref 98–111)
Chloride: 100 mmol/L (ref 98–111)
Creatinine, Ser: 0.96 mg/dL (ref 0.61–1.24)
Creatinine, Ser: 1.13 mg/dL (ref 0.61–1.24)
GFR calc Af Amer: >60 mL/min (ref >60)
GFR calc Af Amer: >60 mL/min (ref >60)
GFR calc non Af Amer: >60 mL/min (ref >60)
GFR calc non Af Amer: >60 mL/min (ref >60)
Glucose, Bld: 115 mg/dL — ABNORMAL HIGH (ref 70–99)
Glucose, Bld: 143 mg/dL — ABNORMAL HIGH (ref 70–99)
Potassium: 3.8 mmol/L (ref 3.5–5.1)
Potassium: 3.7 mmol/L (ref 3.5–5.1)
Sodium: 136 mmol/L (ref 135–145)
Sodium: 134 mmol/L — ABNORMAL LOW (ref 135–145)

## 2019-10-19 LAB — GLUCOSE, CAPILLARY
Glucose-Capillary: 103 mg/dL — ABNORMAL HIGH (ref 70–99)
Glucose-Capillary: 110 mg/dL — ABNORMAL HIGH (ref 70–99)
Glucose-Capillary: 115 mg/dL — ABNORMAL HIGH (ref 70–99)
Glucose-Capillary: 115 mg/dL — ABNORMAL HIGH (ref 70–99)
Glucose-Capillary: 117 mg/dL — ABNORMAL HIGH (ref 70–99)
Glucose-Capillary: 119 mg/dL — ABNORMAL HIGH (ref 70–99)
Glucose-Capillary: 120 mg/dL — ABNORMAL HIGH (ref 70–99)
Glucose-Capillary: 130 mg/dL — ABNORMAL HIGH (ref 70–99)

## 2019-10-19 LAB — CBC
HCT: 39.3 % (ref 39.0–52.0)
HCT: 40.1 % (ref 39.0–52.0)
Hemoglobin: 12.9 g/dL — ABNORMAL LOW (ref 13.0–17.0)
Hemoglobin: 12.8 g/dL — ABNORMAL LOW (ref 13.0–17.0)
MCH: 30.9 pg (ref 26.0–34.0)
MCH: 30.3 pg (ref 26.0–34.0)
MCHC: 32.8 g/dL (ref 30.0–36.0)
MCHC: 31.9 g/dL (ref 30.0–36.0)
MCV: 94.2 fL (ref 80.0–100.0)
MCV: 95 fL (ref 80.0–100.0)
Platelets: 161 10*3/uL (ref 150–400)
Platelets: 165 10*3/uL (ref 150–400)
RBC: 4.17 MIL/uL — ABNORMAL LOW (ref 4.22–5.81)
RBC: 4.22 MIL/uL (ref 4.22–5.81)
RDW: 13 % (ref 11.5–15.5)
RDW: 13 % (ref 11.5–15.5)
WBC: 9.8 10*3/uL (ref 4.0–10.5)
WBC: 10.5 10*3/uL (ref 4.0–10.5)
nRBC: 0 % (ref 0.0–0.2)
nRBC: 0 % (ref 0.0–0.2)

## 2019-10-19 LAB — MAGNESIUM
Magnesium: 2.2 mg/dL (ref 1.7–2.4)
Magnesium: 2 mg/dL (ref 1.7–2.4)

## 2019-10-19 MED ORDER — KETOROLAC TROMETHAMINE 15 MG/ML IJ SOLN: 15.0000 mg | Freq: Four times a day (QID) | INTRAMUSCULAR | Status: DC | PRN

## 2019-10-19 MED ORDER — METOPROLOL TARTRATE 25 MG PO TABS
25.0000 mg | ORAL_TABLET | Freq: Two times a day (BID) | ORAL | Status: DC
  Administered 2019-10-19 – 2019-10-22 (×7): 25 mg via ORAL
  Filled 2019-10-19 (×7): qty 1

## 2019-10-19 MED ORDER — INSULIN DETEMIR 100 UNIT/ML ~~LOC~~ SOLN
15.0000 [IU] | Freq: Once | SUBCUTANEOUS | Status: AC
  Administered 2019-10-19: 15 [IU] via SUBCUTANEOUS
  Filled 2019-10-19: qty 0.15

## 2019-10-19 MED ORDER — METOPROLOL TARTRATE 25 MG/10 ML ORAL SUSPENSION
25.0000 mg | Freq: Two times a day (BID) | ORAL | Status: DC
  Filled 2019-10-19 (×5): qty 10

## 2019-10-19 MED ORDER — FUROSEMIDE 10 MG/ML IJ SOLN
40.0000 mg | Freq: Two times a day (BID) | INTRAMUSCULAR | Status: AC
  Administered 2019-10-19 (×2): 40 mg via INTRAVENOUS
  Filled 2019-10-19 (×2): qty 4

## 2019-10-19 MED ORDER — INSULIN ASPART 100 UNIT/ML ~~LOC~~ SOLN
0.0000 [IU] | SUBCUTANEOUS | Status: DC
  Administered 2019-10-19: 2 [IU] via SUBCUTANEOUS

## 2019-10-19 MED ORDER — ENOXAPARIN SODIUM 40 MG/0.4ML ~~LOC~~ SOLN
40.0000 mg | Freq: Every day | SUBCUTANEOUS | Status: DC
  Administered 2019-10-19 – 2019-10-21 (×3): 40 mg via SUBCUTANEOUS
  Filled 2019-10-19 (×3): qty 0.4

## 2019-10-19 MED ORDER — POTASSIUM CHLORIDE CRYS ER 20 MEQ PO TBCR
20.0000 meq | EXTENDED_RELEASE_TABLET | Freq: Three times a day (TID) | ORAL | Status: AC
  Administered 2019-10-19 (×3): 20 meq via ORAL
  Filled 2019-10-19 (×3): qty 1

## 2019-10-19 MED ORDER — LOSARTAN POTASSIUM 50 MG PO TABS
50.0000 mg | ORAL_TABLET | Freq: Every day | ORAL | Status: DC
  Administered 2019-10-19 – 2019-10-22 (×4): 50 mg via ORAL
  Filled 2019-10-19 (×5): qty 1

## 2019-10-19 MED FILL — Thrombin (Recombinant) For Soln 20000 Unit: CUTANEOUS | Qty: 1 | Status: AC

## 2019-10-19 MED FILL — Heparin Sodium (Porcine) Inj 1000 Unit/ML: INTRAMUSCULAR | Qty: 30 | Status: AC

## 2019-10-19 NOTE — Progress Notes (Signed)
1 Day Post-Op Procedure(s) (LRB): CORONARY ARTERY BYPASS GRAFTING (CABG), ON PUMP, TIMES FOUR, USING LEFT INTERAL MAMMARY ATERY AND ENDOSCOPICALLY HARVESTED RIGHT GREATER SAPHENOUS VEIN (N/A) TRANSESOPHAGEAL ECHOCARDIOGRAM (TEE) (N/A) Subjective: No complaints. Slept some. Pain under control  Objective: Vital signs in last 24 hours: Temp:  [95.9 F (35.5 C)-100.4 F (38 C)] 99.5 F (37.5 C) (08/06 0700) Pulse Rate:  [79-108] 105 (08/06 0700) Cardiac Rhythm: Normal sinus rhythm;Sinus tachycardia (08/06 0400) Resp:  [12-31] 19 (08/06 0700) BP: (86-166)/(61-98) 106/69 (08/06 0700) SpO2:  [93 %-100 %] 96 % (08/06 0700) Arterial Line BP: (67-195)/(49-88) 127/65 (08/06 0700) FiO2 (%):  [40 %-50 %] 40 % (08/05 1542) Weight:  [102.9 kg] 102.9 kg (08/06 0500)  Hemodynamic parameters for last 24 hours: PAP: (18-42)/(10-22) 26/16 CO:  [3.8 L/min-7.7 L/min] 6.5 L/min CI:  [1.8 L/min/m2-3.6 L/min/m2] 3.2 L/min/m2  Intake/Output from previous day: 08/05 0701 - 08/06 0700 In: 5189.8 [I.V.:3144.6; Blood:306; IV Piggyback:1739.2] Out: 5643 [Urine:2580; Blood:525; Chest Tube:480] Intake/Output this shift: No intake/output data recorded.  General appearance: alert and cooperative Neurologic: intact Heart: regular rate and rhythm, S1, S2 normal, no murmur Lungs: clear to auscultation bilaterally Extremities: edema mild Wound: dressings dry  Lab Results: Recent Labs    10/18/19 2014 10/19/19 0416  WBC 11.1* 9.8  HGB 12.8* 12.9*  HCT 39.1 39.3  PLT 177 161   BMET:  Recent Labs    10/18/19 2014 10/19/19 0416  NA 138 136  K 3.9 3.8  CL 107 106  CO2 22 22  GLUCOSE 127* 115*  BUN 12 11  CREATININE 0.91 0.96  CALCIUM 8.1* 8.2*    PT/INR:  Recent Labs    10/18/19 1341  LABPROT 15.8*  INR 1.3*   ABG    Component Value Date/Time   PHART 7.403 10/18/2019 1719   HCO3 26.8 10/18/2019 1719   TCO2 28 10/18/2019 1719   ACIDBASEDEF 3.0 (H) 10/18/2019 1608   O2SAT 97.0  10/18/2019 1719   CBG (last 3)  Recent Labs    10/19/19 0418 10/19/19 0556 10/19/19 0700  GLUCAP 110* 103* 119*   CXR: left basilar atelectasis  ECG: sinus tachy, no acute changes.  Assessment/Plan: S/P Procedure(s) (LRB): CORONARY ARTERY BYPASS GRAFTING (CABG), ON PUMP, TIMES FOUR, USING LEFT INTERAL MAMMARY ATERY AND ENDOSCOPICALLY HARVESTED RIGHT GREATER SAPHENOUS VEIN (N/A) TRANSESOPHAGEAL ECHOCARDIOGRAM (TEE) (N/A)  POD 1  Hemodynamically stable but has been hypertensive postop. Still on NTG. Will increase Lopressor to 25 bid and resume Cozaar 50.   Volume excess: wt is 10 lbs over preop. Start diuresis and Kdur.  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 Will start Lovenox and continue SCD's, mobilize. Plan to resume Xarelto after pacing wires out.  Glucose under good control. No hx of DM and preop Hgb A1c was 5.9. Will give him 15 units of Levemir once this am to get off insulin drip and start SSI. Probably be able to stop this by tomorrow.  DC chest tubes, swan, arterial line.  IS, OOB, ambulation.   LOS: 3 days    Gaye Pollack 10/19/2019

## 2019-10-19 NOTE — Plan of Care (Signed)
  Problem: Clinical Measurements: Goal: Ability to maintain clinical measurements within normal limits will improve Outcome: Progressing Goal: Will remain free from infection Outcome: Progressing Goal: Diagnostic test results will improve Outcome: Progressing Goal: Respiratory complications will improve Outcome: Progressing Goal: Cardiovascular complication will be avoided Outcome: Progressing   Problem: Activity: Goal: Risk for activity intolerance will decrease Outcome: Progressing   Problem: Nutrition: Goal: Adequate nutrition will be maintained Outcome: Progressing   Problem: Elimination: Goal: Will not experience complications related to bowel motility Outcome: Progressing Goal: Will not experience complications related to urinary retention Outcome: Progressing   Problem: Pain Managment: Goal: General experience of comfort will improve Outcome: Progressing   Problem: Safety: Goal: Ability to remain free from injury will improve Outcome: Progressing   Problem: Skin Integrity: Goal: Risk for impaired skin integrity will decrease Outcome: Progressing   Problem: Health Behavior/Discharge Planning: Goal: Ability to manage health-related needs will improve Outcome: Adequate for Discharge   Problem: Education: Goal: Knowledge of General Education information will improve Description: Including pain rating scale, medication(s)/side effects and non-pharmacologic comfort measures Outcome: Completed/Met   Problem: Coping: Goal: Level of anxiety will decrease Outcome: Completed/Met

## 2019-10-19 NOTE — Progress Notes (Signed)
      [address]       ,[place] [address]             [phone]      POD # 1  Up in chair  BP 92/62   Pulse 96   Temp 98.9 F (37.2 C)   Resp 18   Ht 5\' 10"  (1.778 m)   Wt 102.9 kg   SpO2 94%   BMI 32.55 kg/m    Intake/Output Summary (Last 24 hours) at 10/19/2019 1853 Last data filed at 10/19/2019 1800 Gross per 24 hour  Intake 1377.7 ml  Output 3025 ml  Net -1647.3 ml    Hct = 40 K= 3.7, creatinine 1.13  Doing well POD # 1  Danford Tat C. Roxan Hockey, MD [facility] [phone]

## 2019-10-19 NOTE — Addendum Note (Signed)
Addendum  created 10/19/19 0820 by Josephine Igo, CRNA   Order list changed

## 2019-10-20 ENCOUNTER — Inpatient Hospital Stay (HOSPITAL_COMMUNITY): Payer: Medicare Other

## 2019-10-20 LAB — BASIC METABOLIC PANEL
Anion gap: 8 (ref 5–15)
BUN: 10 mg/dL (ref 8–23)
CO2: 24 mmol/L (ref 22–32)
Calcium: 8.2 mg/dL — ABNORMAL LOW (ref 8.9–10.3)
Chloride: 102 mmol/L (ref 98–111)
Creatinine, Ser: 1.04 mg/dL (ref 0.61–1.24)
GFR calc Af Amer: >60 mL/min (ref >60)
GFR calc non Af Amer: >60 mL/min (ref >60)
Glucose, Bld: 105 mg/dL — ABNORMAL HIGH (ref 70–99)
Potassium: 3.9 mmol/L (ref 3.5–5.1)
Sodium: 134 mmol/L — ABNORMAL LOW (ref 135–145)

## 2019-10-20 LAB — CBC
HCT: 37.8 % — ABNORMAL LOW (ref 39.0–52.0)
Hemoglobin: 12.2 g/dL — ABNORMAL LOW (ref 13.0–17.0)
MCH: 30.4 pg (ref 26.0–34.0)
MCHC: 32.3 g/dL (ref 30.0–36.0)
MCV: 94.3 fL (ref 80.0–100.0)
Platelets: 145 10*3/uL — ABNORMAL LOW (ref 150–400)
RBC: 4.01 MIL/uL — ABNORMAL LOW (ref 4.22–5.81)
RDW: 13 % (ref 11.5–15.5)
WBC: 9.6 10*3/uL (ref 4.0–10.5)
nRBC: 0 % (ref 0.0–0.2)

## 2019-10-20 LAB — GLUCOSE, CAPILLARY
Glucose-Capillary: 104 mg/dL — ABNORMAL HIGH (ref 70–99)
Glucose-Capillary: 111 mg/dL — ABNORMAL HIGH (ref 70–99)
Glucose-Capillary: 87 mg/dL (ref 70–99)
Glucose-Capillary: 96 mg/dL (ref 70–99)
Glucose-Capillary: 122 mg/dL — ABNORMAL HIGH (ref 70–99)
Glucose-Capillary: 123 mg/dL — ABNORMAL HIGH (ref 70–99)

## 2019-10-20 MED ORDER — INSULIN ASPART 100 UNIT/ML ~~LOC~~ SOLN: 0.0000 [IU] | Freq: Three times a day (TID) | SUBCUTANEOUS | Status: DC

## 2019-10-20 MED ORDER — ALUM & MAG HYDROXIDE-SIMETH 200-200-20 MG/5ML PO SUSP: 15.0000 mL | Freq: Four times a day (QID) | ORAL | Status: DC | PRN

## 2019-10-20 MED ORDER — SODIUM CHLORIDE 0.9 % IV SOLN: 250.0000 mL | INTRAVENOUS | Status: DC | PRN

## 2019-10-20 MED ORDER — SODIUM CHLORIDE 0.9% FLUSH
3.0000 mL | Freq: Two times a day (BID) | INTRAVENOUS | Status: DC
  Administered 2019-10-20 – 2019-10-22 (×4): 3 mL via INTRAVENOUS

## 2019-10-20 MED ORDER — POLYETHYLENE GLYCOL 3350 17 G PO PACK
17.0000 g | PACK | Freq: Once | ORAL | Status: AC
  Administered 2019-10-20: 17 g via ORAL
  Filled 2019-10-20: qty 1

## 2019-10-20 MED ORDER — ASPIRIN EC 81 MG PO TBEC
81.0000 mg | DELAYED_RELEASE_TABLET | Freq: Every day | ORAL | Status: DC
  Administered 2019-10-21 – 2019-10-22 (×2): 81 mg via ORAL
  Filled 2019-10-20 (×2): qty 1

## 2019-10-20 MED ORDER — SODIUM CHLORIDE 0.9% FLUSH: 3.0000 mL | INTRAVENOUS | Status: DC | PRN

## 2019-10-20 MED ORDER — ~~LOC~~ CARDIAC SURGERY, PATIENT & FAMILY EDUCATION: Freq: Once | Status: DC

## 2019-10-20 MED ORDER — MAGNESIUM HYDROXIDE 400 MG/5ML PO SUSP: 30.0000 mL | Freq: Every day | ORAL | Status: DC | PRN

## 2019-10-20 MED ORDER — ZOLPIDEM TARTRATE 5 MG PO TABS: 5.0000 mg | ORAL_TABLET | Freq: Every evening | ORAL | Status: DC | PRN

## 2019-10-20 NOTE — Progress Notes (Signed)
Mobility Specialist - Progress Note   10/20/19 1423  Mobility  Activity Ambulated in hall  Level of Assistance Independent  Assistive Device None  Distance Ambulated (ft) 960 ft  Mobility Response Tolerated well  Mobility performed by Mobility specialist  $Mobility charge 1 Mobility    Pre-mobility: 81 HR During mobility: 96 HR Post-mobility: 82 HR  Pt said he felt fine ambulating w/o the RW and that he has been walking independently in his room.   Pricilla Handler Mobility Specialist Mobility Specialist Phone: [phone]

## 2019-10-20 NOTE — Progress Notes (Signed)
2 Days Post-Op Procedure(s) (LRB): CORONARY ARTERY BYPASS GRAFTING (CABG), ON PUMP, TIMES FOUR, USING LEFT INTERAL MAMMARY ATERY AND ENDOSCOPICALLY HARVESTED RIGHT GREATER SAPHENOUS VEIN (N/A) TRANSESOPHAGEAL ECHOCARDIOGRAM (TEE) (N/A) Subjective: Feels well Walked 3 laps this AM  Objective: Vital signs in last 24 hours: Temp:  [98.6 F (37 C)-99.5 F (37.5 C)] 98.7 F (37.1 C) (08/07 0655) Pulse Rate:  [90-111] 90 (08/07 0400) Cardiac Rhythm: Normal sinus rhythm (08/07 0400) Resp:  [13-26] 17 (08/07 0400) BP: (92-115)/(58-76) 112/70 (08/07 0400) SpO2:  [91 %-95 %] 93 % (08/07 0400) Arterial Line BP: (96-141)/(46-67) 96/53 (08/06 1315) Weight:  [100.7 kg] 100.7 kg (08/07 0500)  Hemodynamic parameters for last 24 hours: PAP: (21-35)/(7-25) 32/14  Intake/Output from previous day: 08/06 0701 - 08/07 0700 In: 426.5 [I.V.:206.5; IV Piggyback:220.1] Out: 4225 [Urine:4225] Intake/Output this shift: No intake/output data recorded.  General appearance: alert, cooperative and no distress Neurologic: intact Heart: regular rate and rhythm Lungs: diminished breath sounds bibasilar Abdomen: normal findings: soft, non-tender  Lab Results: Recent Labs    10/19/19 1731 10/20/19 0341  WBC 10.5 9.6  HGB 12.8* 12.2*  HCT 40.1 37.8*  PLT 165 145*   BMET:  Recent Labs    10/19/19 1731 10/20/19 0341  NA 134* 134*  K 3.7 3.9  CL 100 102  CO2 25 24  GLUCOSE 143* 105*  BUN 12 10  CREATININE 1.13 1.04  CALCIUM 8.2* 8.2*    PT/INR:  Recent Labs    10/18/19 1341  LABPROT 15.8*  INR 1.3*   ABG    Component Value Date/Time   PHART 7.403 10/18/2019 1719   HCO3 26.8 10/18/2019 1719   TCO2 28 10/18/2019 1719   ACIDBASEDEF 3.0 (H) 10/18/2019 1608   O2SAT 97.0 10/18/2019 1719   CBG (last 3)  Recent Labs    10/19/19 2341 10/20/19 0343 10/20/19 0654  GLUCAP 120* 104* 111*    Assessment/Plan: S/P Procedure(s) (LRB): CORONARY ARTERY BYPASS GRAFTING (CABG), ON PUMP,  TIMES FOUR, USING LEFT INTERAL MAMMARY ATERY AND ENDOSCOPICALLY HARVESTED RIGHT GREATER SAPHENOUS VEIN (N/A) TRANSESOPHAGEAL ECHOCARDIOGRAM (TEE) (N/A) POD # 2 Doing well CV- stable in SR  ASA, beta blocker, statin HEME- Factor V leyden, history of PE  On low dose lovenox, resume Xarelto after pacing wires removed RESP- IS RENAL- creatinine and lytes OK, almost 4L negative yesterday ENDO- CBG well controlled Ambulating well Transfer to 4E   LOS: 4 days    Melrose Nakayama 10/20/2019

## 2019-10-21 LAB — BASIC METABOLIC PANEL
Anion gap: 7 (ref 5–15)
BUN: 10 mg/dL (ref 8–23)
CO2: 28 mmol/L (ref 22–32)
Calcium: 8.6 mg/dL — ABNORMAL LOW (ref 8.9–10.3)
Chloride: 106 mmol/L (ref 98–111)
Creatinine, Ser: 1.19 mg/dL (ref 0.61–1.24)
GFR calc Af Amer: >60 mL/min (ref >60)
GFR calc non Af Amer: >60 mL/min (ref >60)
Glucose, Bld: 106 mg/dL — ABNORMAL HIGH (ref 70–99)
Potassium: 4.5 mmol/L (ref 3.5–5.1)
Sodium: 141 mmol/L (ref 135–145)

## 2019-10-21 LAB — CBC
HCT: 37.8 % — ABNORMAL LOW (ref 39.0–52.0)
Hemoglobin: 11.9 g/dL — ABNORMAL LOW (ref 13.0–17.0)
MCH: 30.4 pg (ref 26.0–34.0)
MCHC: 31.5 g/dL (ref 30.0–36.0)
MCV: 96.4 fL (ref 80.0–100.0)
Platelets: 162 10*3/uL (ref 150–400)
RBC: 3.92 MIL/uL — ABNORMAL LOW (ref 4.22–5.81)
RDW: 13.2 % (ref 11.5–15.5)
WBC: 9.3 10*3/uL (ref 4.0–10.5)
nRBC: 0 % (ref 0.0–0.2)

## 2019-10-21 LAB — GLUCOSE, CAPILLARY
Glucose-Capillary: 96 mg/dL (ref 70–99)
Glucose-Capillary: 128 mg/dL — ABNORMAL HIGH (ref 70–99)
Glucose-Capillary: 100 mg/dL — ABNORMAL HIGH (ref 70–99)

## 2019-10-21 NOTE — Progress Notes (Addendum)
       [address]       [place] [address]             [phone]       3 Days Post-Op Procedure(s) (LRB): CORONARY ARTERY BYPASS GRAFTING (CABG), ON PUMP, TIMES FOUR, USING LEFT INTERAL MAMMARY ATERY AND ENDOSCOPICALLY HARVESTED RIGHT GREATER SAPHENOUS VEIN (N/A) TRANSESOPHAGEAL ECHOCARDIOGRAM (TEE) (N/A) Subjective: -Bedside chair.  He has already walked twice today.  He feels well overall and has no complaints or concerns.  He is not requiring any narcotic analgesics at this point.  Objective: Vital signs in last 24 hours: Temp:  [98.3 F (36.8 C)-99.6 F (37.6 C)] 99.2 F (37.3 C) (08/08 1124) Pulse Rate:  [74-93] 88 (08/08 1124) Cardiac Rhythm: Normal sinus rhythm (08/08 0747) Resp:  [14-20] 19 (08/08 1124) BP: (105-125)/(68-76) 112/76 (08/08 1124) SpO2:  [95 %-100 %] 96 % (08/08 1124) Weight:  [99.6 kg] 99.6 kg (08/08 0426)    Intake/Output from previous day: 08/07 0701 - 08/08 0700 In: 780 [P.O.:680; IV Piggyback:100] Out: 1380 [Urine:1380] Intake/Output this shift: Total I/O In: 240 [P.O.:240] Out: -   General appearance: alert, cooperative and no distress Neurologic: intact Heart: regular rate and rhythm Lungs: clear to auscultation bilaterally Abdomen: Soft and nontender Extremities: All warm and well-perfused.  No peripheral edema.  The right lower extremity EVH incisions are all intact and dry. Wound: Sternal dressing was removed.  Sternotomy incision is dry and well approximated.  Lab Results: Recent Labs    10/20/19 0341 10/21/19 0423  WBC 9.6 9.3  HGB 12.2* 11.9*  HCT 37.8* 37.8*  PLT 145* 162   BMET:  Recent Labs    10/20/19 0341 10/21/19 0423  NA 134* 141  K 3.9 4.5  CL 102 106  CO2 24 28  GLUCOSE 105* 106*  BUN 10 10  CREATININE 1.04 1.19  CALCIUM 8.2* 8.6*    PT/INR:  Recent Labs    10/18/19 1341  LABPROT 15.8*  INR 1.3*   ABG    Component Value Date/Time   PHART 7.403 10/18/2019 1719   HCO3 26.8  10/18/2019 1719   TCO2 28 10/18/2019 1719   ACIDBASEDEF 3.0 (H) 10/18/2019 1608   O2SAT 97.0 10/18/2019 1719   CBG (last 3)  Recent Labs    10/20/19 2217 10/21/19 0634 10/21/19 1123  GLUCAP 123* 96 128*    Assessment/Plan: S/P Procedure(s) (LRB): CORONARY ARTERY BYPASS GRAFTING (CABG), ON PUMP, TIMES FOUR, USING LEFT INTERAL MAMMARY ATERY AND ENDOSCOPICALLY HARVESTED RIGHT GREATER SAPHENOUS VEIN (N/A) TRANSESOPHAGEAL ECHOCARDIOGRAM (TEE) (N/A)  -Postop day 3 CABG x4.  Making excellent progress.  He is already independent with his mobility and is maintaining adequate oxygen saturation on room air.   -History of HTN-  Blood pressure is well controlled on current regimen of metoprolol 25mg  twice a day and losartan 50 mg once a day.   -Factor V Leiden-.  Anticipate restarting Eliquis following discharge.  Continue Lovenox and SCDs when not ambulatory.  -Crardiac rhythm is stable stable.  Anticipate pcer wire removal tomorrow.    LOS: 5 days    Antony Odea, PA-C [phone] 10/21/2019 Patient seen and examined, agree with above He is in Sr- will dc pacing wires, then restart Xarelto tomorrow Possibly home tomorrow  Revonda Standard. Roxan Hockey, MD [facility] [phone]

## 2019-10-21 NOTE — Progress Notes (Signed)
EPWs removed per protocol, patient tolerated well.  Instructed on bedrest for 1 hr with monitoring.  Will continue to monitor.

## 2019-10-21 NOTE — Progress Notes (Signed)
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   Pricilla Handler Mobility Specialist Mobility Specialist Phone: [phone]

## 2019-10-22 LAB — CBC
HCT: 37.5 % — ABNORMAL LOW (ref 39.0–52.0)
Hemoglobin: 12.1 g/dL — ABNORMAL LOW (ref 13.0–17.0)
MCH: 30.9 pg (ref 26.0–34.0)
MCHC: 32.3 g/dL (ref 30.0–36.0)
MCV: 95.7 fL (ref 80.0–100.0)
Platelets: 205 10*3/uL (ref 150–400)
RBC: 3.92 MIL/uL — ABNORMAL LOW (ref 4.22–5.81)
RDW: 13.1 % (ref 11.5–15.5)
WBC: 7.8 10*3/uL (ref 4.0–10.5)
nRBC: 0 % (ref 0.0–0.2)

## 2019-10-22 LAB — BASIC METABOLIC PANEL
Anion gap: 8 (ref 5–15)
BUN: 11 mg/dL (ref 8–23)
CO2: 26 mmol/L (ref 22–32)
Calcium: 8.7 mg/dL — ABNORMAL LOW (ref 8.9–10.3)
Chloride: 106 mmol/L (ref 98–111)
Creatinine, Ser: 1.05 mg/dL (ref 0.61–1.24)
GFR calc Af Amer: >60 mL/min (ref >60)
GFR calc non Af Amer: >60 mL/min (ref >60)
Glucose, Bld: 113 mg/dL — ABNORMAL HIGH (ref 70–99)
Potassium: 3.9 mmol/L (ref 3.5–5.1)
Sodium: 140 mmol/L (ref 135–145)

## 2019-10-22 LAB — GLUCOSE, CAPILLARY
Glucose-Capillary: 107 mg/dL — ABNORMAL HIGH (ref 70–99)
Glucose-Capillary: 97 mg/dL (ref 70–99)

## 2019-10-22 MED ORDER — TRAMADOL HCL 50 MG PO TABS: 50.0000 mg | ORAL_TABLET | ORAL | 0 refills | Status: AC | PRN

## 2019-10-22 MED ORDER — METOPROLOL TARTRATE 25 MG PO TABS: 25.0000 mg | ORAL_TABLET | Freq: Two times a day (BID) | ORAL | 2 refills | Status: DC

## 2019-10-22 MED ORDER — ASPIRIN 81 MG PO TBEC: 81.0000 mg | DELAYED_RELEASE_TABLET | Freq: Every day | ORAL | 11 refills | Status: DC

## 2019-10-22 MED ORDER — ATORVASTATIN CALCIUM 80 MG PO TABS: 80.0000 mg | ORAL_TABLET | Freq: Every day | ORAL | 2 refills | Status: DC

## 2019-10-22 NOTE — Progress Notes (Signed)
Spoke with pt earlier. Discussed IS, exercise guidelines, and CRPII. Will refer to Jose Bowers. He asked that the rest of education be done with his wife. [phone]  Wife arrived and discussed restrictions at home, walking, diet, and CRPII. Pt and wife receptive, asked appropriate questions. Ready for d/c. [phone] Yves Dill CES, ACSM 12:08 PM 10/22/2019

## 2019-10-22 NOTE — Progress Notes (Signed)
D/C instructions given to patient and wife. Medications and wound care reviewed. All questions answered. IV's removed, clean and intact. Wife to escort pt home.  Annalina Needles, RN  

## 2019-10-22 NOTE — Progress Notes (Addendum)
      [address]       [place] [address]             [phone]      4 Days Post-Op Procedure(s) (LRB): CORONARY ARTERY BYPASS GRAFTING (CABG), ON PUMP, TIMES FOUR, USING LEFT INTERAL MAMMARY ATERY AND ENDOSCOPICALLY HARVESTED RIGHT GREATER SAPHENOUS VEIN (N/A) TRANSESOPHAGEAL ECHOCARDIOGRAM (TEE) (N/A) Subjective: Resting in bed, no complaints or concerns. Jose Bowers he is ready to return home.   Objective: Vital signs in last 24 hours: Temp:  [98.2 F (36.8 C)-99.7 F (37.6 C)] 98.3 F (36.8 C) (08/09 0613) Pulse Rate:  [74-100] 87 (08/09 0613) Cardiac Rhythm: Sinus tachycardia (08/08 1910) Resp:  [14-20] 17 (08/09 0613) BP: (112-142)/(75-93) 134/81 (08/09 0613) SpO2:  [94 %-97 %] 94 % (08/09 0613) Weight:  [97.9 kg] 97.9 kg (08/09 0613)     Intake/Output from previous day: 08/08 0701 - 08/09 0700 In: 720 [P.O.:720] Out: 1600 [Urine:1600] Intake/Output this shift: No intake/output data recorded.  General appearance: alert, cooperative and no distress Neurologic: intact Heart: regular rate and rhythm Lungs: clear to auscultation bilaterally Abdomen: Soft and nontender Extremities: All warm and well-perfused.  No peripheral edema.  The right lower extremity EVH incisions are all intact and dry. Wound: Sternal dressing was removed.  Sternotomy incision is dry and well approximated.   Lab Results: Recent Labs    10/21/19 0423 10/22/19 0224  WBC 9.3 7.8  HGB 11.9* 12.1*  HCT 37.8* 37.5*  PLT 162 205   BMET:  Recent Labs    10/21/19 0423 10/22/19 0224  NA 141 140  K 4.5 3.9  CL 106 106  CO2 28 26  GLUCOSE 106* 113*  BUN 10 11  CREATININE 1.19 1.05  CALCIUM 8.6* 8.7*    PT/INR: No results for input(s): LABPROT, INR in the last 72 hours. ABG    Component Value Date/Time   PHART 7.403 10/18/2019 1719   HCO3 26.8 10/18/2019 1719   TCO2 28 10/18/2019 1719   ACIDBASEDEF 3.0 (H) 10/18/2019 1608   O2SAT 97.0 10/18/2019 1719   CBG  (last 3)  Recent Labs    10/21/19 1617 10/21/19 2112 10/22/19 0611  GLUCAP 100* 107* 97    Assessment/Plan: S/P Procedure(s) (LRB): CORONARY ARTERY BYPASS GRAFTING (CABG), ON PUMP, TIMES FOUR, USING LEFT INTERAL MAMMARY ATERY AND ENDOSCOPICALLY HARVESTED RIGHT GREATER SAPHENOUS VEIN (N/A) TRANSESOPHAGEAL ECHOCARDIOGRAM (TEE) (N/A)  -Postop day 4 CABG x4.  Making excellent progress.  He is independent with his mobility and is maintaining adequate oxygen saturation on room air.   -History of HTN-  Blood pressure control is adequate on current regimen of metoprolol 25mg  twice a day and losartan 50 mg once a day.   -Factor V Leiden-.  Resume Xarelto at discharge.  -Cardiac rhythm is stable. Pacer wires removed yesterday.  -Disposition- discharge to home. Instructions given and follow up arranged.    LOS: 6 days    [name] [phone] 10/22/2019  Home today - to resume xarelto as was taking preop   to go to cardiac rehab - [place]  I have seen and examined Jose Bowers and agree with the above assessment  and plan.  [name] MD Beeper [phone] Office [phone] 10/22/2019 11:44 AM

## 2019-10-22 NOTE — Care Management Important Message (Signed)
Important Message  Patient Details  Name: Jose Bowers MRN: [id number] Date of Birth: [birth year]   Medicare Important Message Given:  Yes     Shelda Altes 10/22/2019, 10:05 AM

## 2019-10-23 ENCOUNTER — Other Ambulatory Visit (HOSPITAL_COMMUNITY): Payer: Medicare Other

## 2019-10-23 MED FILL — Electrolyte-R (PH 7.4) Solution: INTRAVENOUS | Qty: 2000 | Status: AC

## 2019-10-23 MED FILL — Lidocaine HCl Local Soln Prefilled Syringe 100 MG/5ML (2%): INTRAMUSCULAR | Qty: 5 | Status: AC

## 2019-10-23 MED FILL — Sodium Bicarbonate IV Soln 8.4%: INTRAVENOUS | Qty: 50 | Status: AC

## 2019-10-23 MED FILL — Mannitol IV Soln 20%: INTRAVENOUS | Qty: 500 | Status: AC

## 2019-10-23 MED FILL — Heparin Sodium (Porcine) Inj 1000 Unit/ML: INTRAMUSCULAR | Qty: 10 | Status: AC

## 2019-10-23 MED FILL — Heparin Sodium (Porcine) Inj 1000 Unit/ML: INTRAMUSCULAR | Qty: 120 | Status: AC

## 2019-10-30 ENCOUNTER — Telehealth (HOSPITAL_COMMUNITY): Payer: Self-pay

## 2019-10-30 ENCOUNTER — Ambulatory Visit (INDEPENDENT_AMBULATORY_CARE_PROVIDER_SITE_OTHER): Payer: Self-pay

## 2019-10-30 ENCOUNTER — Other Ambulatory Visit: Payer: Self-pay

## 2019-10-30 VITALS — Temp 97.3°F

## 2019-10-30 DIAGNOSIS — I2510 Atherosclerotic heart disease of native coronary artery without angina pectoris: Secondary | ICD-10-CM | POA: Diagnosis not present

## 2019-10-30 DIAGNOSIS — Z4802 Encounter for removal of sutures: Secondary | ICD-10-CM

## 2019-10-30 DIAGNOSIS — Z951 Presence of aortocoronary bypass graft: Secondary | ICD-10-CM

## 2019-10-30 NOTE — Telephone Encounter (Signed)
Faxed referral for Phase II cardiac rehab to [facility].

## 2019-10-30 NOTE — Progress Notes (Signed)
Pt has three sutures in place to upper abdomen. All incisions are well-approximated and without s/s of infection. Three sutures easily removed per standard protocol. Pt instructed to notify office of any s/s of infection or other problems. Pt c/o swelling/"knot" to R medial knee near incision site. No redness or excess heat noted. Pt states he noticed this soon after getting home from hospital following surgery and reports the area has decreased in size over the past several days. Denies pain. Photo taken and notified Dr. Cyndia Bent for review. No new orders received at this time.

## 2019-11-05 ENCOUNTER — Encounter: Payer: Self-pay | Admitting: Surgery

## 2019-11-15 NOTE — Progress Notes (Signed)
Cardiology Office Note:    Date:  11/16/2019   ID:  Jose Bowers, DOB 16-Jan-1951, MRN [id number]  PCP:  Wenda Low, MD  Cardiologist:  Donato Heinz, MD  Electrophysiologist:  None   Referring MD: Wenda Low, MD   Chief Complaint  Patient presents with  . Coronary Artery Disease    History of Present Illness:    Jose Bowers is a 69 y.o. male with a hx of CAD status post CABG (LIMA-LAD, SVG-OM, SVG sequential to PDA and PLB) factor V Leiden, hypertension, hyperlipidemia, DVT following shoulder surgery in 2014, saddle PE treated with EKOS in 2019 who presents for follow-up.  He was referred by Dr. Lysle Rubens for evaluation of chest pain, initially seen on 09/19/2019.  Exercise Myoview was attempted on 10/04/2019.  He did not reach target heart rate on treadmill and was converted to pharmacologic study.  Despite not reaching target heart rate, he had 1.5 mm horizontal ST depressions in inferior and anterolateral leads and symptoms of chest pain.  MPI imaging showed reversible defect in apical anterior wall and apex consistent with LAD ischemia.  LVEF 49%.  Cardiac catheterization on 10/16/2019 showed severe heavily calcified multivessel disease (99% proximal LAD, 90% proximal circumflex, 80% distal RCA, 90% RPDA).  Underwent CABG 10/18/19 ((LIMA-LAD, SVG-OM, SVG sequential to PDA and PLB).  Echocardiogram 10/16/2019 showed normal biventricular function, no significant valvular disease.  Since discharge from the hospital, he reports that he has been doing well.  He denies any chest pain or dyspnea.  States he has been walking 3 miles per day.  He denies any lightheadedness.  Has been taking Xarelto and aspirin, denies any bleeding issues.  States that he has been having a sore throat but otherwise has no complaints.   Wt Readings from Last 3 Encounters:  11/16/19 204 lb 3.2 oz (92.6 kg)  10/22/19 215 lb 12.8 oz (97.9 kg)  10/09/19 (!) 221 lb 9.6 oz (100.5 kg)     Past Medical  History:  Diagnosis Date  . Factor V Leiden ([place])   . Hypertension     Past Surgical History:  Procedure Laterality Date  . CORONARY ARTERY BYPASS GRAFT N/A 10/18/2019   Procedure: CORONARY ARTERY BYPASS GRAFTING (CABG), ON PUMP, TIMES FOUR, USING LEFT INTERAL MAMMARY ATERY AND ENDOSCOPICALLY HARVESTED RIGHT GREATER SAPHENOUS VEIN;  Surgeon: Gaye Pollack, MD;  Location: [facility];  Service: Open Heart Surgery;  Laterality: N/A;  . HERNIA REPAIR    . IR ANGIOGRAM PULMONARY BILATERAL SELECTIVE  03/19/2017  . IR ANGIOGRAM SELECTIVE EACH ADDITIONAL VESSEL  03/19/2017  . IR ANGIOGRAM SELECTIVE EACH ADDITIONAL VESSEL  03/19/2017  . IR INFUSION THROMBOL ARTERIAL INITIAL (MS)  03/19/2017  . IR INFUSION THROMBOL ARTERIAL INITIAL (MS)  03/19/2017  . IR THROMB F/U EVAL ART/VEN FINAL DAY (MS)  03/20/2017  . IR US GUIDE VASC ACCESS RIGHT  03/19/2017  . JOINT REPLACEMENT    . LEFT HEART CATH AND CORONARY ANGIOGRAPHY N/A 10/16/2019   Procedure: LEFT HEART CATH AND CORONARY ANGIOGRAPHY;  Surgeon: Leonie Man, MD;  Location: [facility];  Service: Cardiovascular;  Laterality: N/A;  . ROTATOR CUFF REPAIR    . TEE WITHOUT CARDIOVERSION N/A 10/18/2019   Procedure: TRANSESOPHAGEAL ECHOCARDIOGRAM (TEE);  Surgeon: Gaye Pollack, MD;  Location: [facility];  Service: Open Heart Surgery;  Laterality: N/A;    Current Medications: Current Meds  Medication Sig  . amoxicillin (AMOXIL) 500 MG capsule Take 2,000 mg by mouth See admin instructions. Take  2000 mg by mouth prior to any dental work done.  Marland Kitchen aspirin EC 81 MG EC tablet Take 1 tablet (81 mg total) by mouth daily. Swallow whole.  Marland Kitchen atorvastatin (LIPITOR) 80 MG tablet Take 1 tablet (80 mg total) by mouth daily at 6 PM.  . fluocinonide cream (LIDEX) 6.38 % Apply 1 application topically daily as needed (itching/rash (Grover's disease)).  Marland Kitchen losartan-hydrochlorothiazide (HYZAAR) 50-12.5 MG tablet Take 1 tablet by mouth daily.  . metoprolol tartrate (LOPRESSOR) 25 MG tablet  Take 1 tablet (25 mg total) by mouth 2 (two) times daily.  . nitroGLYCERIN (NITROSTAT) 0.4 MG SL tablet Place 1 tablet (0.4 mg total) under the tongue every 5 (five) minutes as needed for chest pain.  Marland Kitchen XARELTO 20 MG TABS tablet Take 20 mg by mouth daily.     Allergies:   Patient has no known allergies.   Social History   Socioeconomic History  . Marital status: Married    Spouse name: Not on file  . Number of children: Not on file  . Years of education: Not on file  . Highest education level: Not on file  Occupational History  . Occupation: retired     Comment: Dietitian   Tobacco Use  . Smoking status: Never Smoker  . Smokeless tobacco: Never Used  Vaping Use  . Vaping Use: Never used  Substance and Sexual Activity  . Alcohol use: Yes    Comment: "few times a week"  . Drug use: No  . Sexual activity: Not on file  Other Topics Concern  . Not on file  Social History Narrative  . Not on file   Social Determinants of Health   Financial Resource Strain:   . Difficulty of Paying Living Expenses: Not on file  Food Insecurity:   . Worried About Charity fundraiser in the Last Year: Not on file  . Ran Out of Food in the Last Year: Not on file  Transportation Needs:   . Lack of Transportation (Medical): Not on file  . Lack of Transportation (Non-Medical): Not on file  Physical Activity:   . Days of Exercise per Week: Not on file  . Minutes of Exercise per Session: Not on file  Stress:   . Feeling of Stress : Not on file  Social Connections:   . Frequency of Communication with Friends and Family: Not on file  . Frequency of Social Gatherings with Friends and Family: Not on file  . Attends Religious Services: Not on file  . Active Member of Clubs or Organizations: Not on file  . Attends Archivist Meetings: Not on file  . Marital Status: Not on file     Family History: The patient's family history includes Clotting disorder in his brother, father,  grandchild, and son.  ROS:   Please see the history of present illness.     All other systems reviewed and are negative.  EKGs/Labs/Other Studies Reviewed:    The following studies were reviewed today:   EKG:  EKG is ordered today.  The ekg ordered today demonstrates normal sinus rhythm, rate 63, no ST abnormalities  Recent Labs: 10/19/2019: Magnesium 2.0 10/22/2019: BUN 11; Creatinine, Ser 1.05; Hemoglobin 12.1; Platelets 205; Potassium 3.9; Sodium 140  Recent Lipid Panel    Component Value Date/Time   CHOL 171 10/18/2019 0511   TRIG 120 10/18/2019 0511   HDL 48 10/18/2019 0511   CHOLHDL 3.6 10/18/2019 0511   VLDL 24 10/18/2019 0511   LDLCALC  99 10/18/2019 0511    Physical Exam:    VS:  BP 122/72   Pulse 63   Ht 5\' 10"  (1.778 m)   Wt 204 lb 3.2 oz (92.6 kg)   SpO2 97%   BMI 29.30 kg/m     Wt Readings from Last 3 Encounters:  11/16/19 204 lb 3.2 oz (92.6 kg)  10/22/19 215 lb 12.8 oz (97.9 kg)  10/09/19 (!) 221 lb 9.6 oz (100.5 kg)     GEN: in no acute distress HEENT: Normal NECK: No JVD; No carotid bruits CARDIAC: RRR, no murmurs, rubs, gallops RESPIRATORY:  Clear to auscultation without rales, wheezing or rhonchi  ABDOMEN: Soft, non-tender, non-distended MUSCULOSKELETAL:  No edema; area of induration adjacent to the incision and right lower leg.  No erythema, warmth, or tenderness. SKIN: Warm and dry NEUROLOGIC:  Alert and oriented x 3 PSYCHIATRIC:  Normal affect   ASSESSMENT:    1. CAD in native artery   2. S/P CABG (coronary artery bypass graft)   3. Essential hypertension   4. Hyperlipidemia, unspecified hyperlipidemia type    PLAN:    CAD: Cardiac catheterization on 10/16/2019 showed severe heavily calcified multivessel disease (99% proximal LAD, 90% proximal circumflex, 80% distal RCA, 90% RPDA).  Underwent CABG 10/18/19 (LIMA-LAD, SVG-OM, SVG sequential to PDA and PLB) -Continue aspirin 81 mg daily -Continue atorvastatin 80 mg daily -Continue  metoprolol 25 mg twice daily -Encouraged participation in cardiac rehab  Hypertension: On losartan-hydrochlorothiazide 50-12.5 mg daily metoprolol 25 mg twice daily.  Appears controlled  Hyperlipidemia:  LDL 89 on 05/30/19.  Started on atorvastatin 80 mg daily, will recheck lipid panel at next clinic visit  Factor V Leiden: History of DVT and PE.  On Xarelto  RTC in 3 months  Medication Adjustments/Labs and Tests Ordered: Current medicines are reviewed at length with the patient today.  Concerns regarding medicines are outlined above.  Orders Placed This Encounter  Procedures  . EKG 12-Lead   No orders of the defined types were placed in this encounter.   Patient Instructions  Medication Instructions:  Your physician recommends that you continue on your current medications as directed. Please refer to the Current Medication list given to you today.  *If you need a refill on your cardiac medications before your next appointment, please call your pharmacy*   Follow-Up: At [facility], you and your health needs are our priority.  As part of our continuing mission to provide you with exceptional heart care, we have created designated Provider Care Teams.  These Care Teams include your primary Cardiologist (physician) and Advanced Practice Providers (APPs -  Physician Assistants and Nurse Practitioners) who all work together to provide you with the care you need, when you need it.  We recommend signing up for the patient portal called "MyChart".  Sign up information is provided on this After Visit Summary.  MyChart is used to connect with patients for Virtual Visits (Telemedicine).  Patients are able to view lab/test results, encounter notes, upcoming appointments, etc.  Non-urgent messages can be sent to your provider as well.   To learn more about what you can do with MyChart, go to [email or web address].    Your next appointment:   3 month(s)  The format for your next  appointment:   In Person  Provider:   Oswaldo Milian, MD   Other Instructions Phone number to Cardiac Rehab in [place]     Signed, Donato Heinz, MD  11/16/2019 1:08 PM  [facility]

## 2019-11-16 ENCOUNTER — Telehealth: Payer: Self-pay | Admitting: Cardiology

## 2019-11-16 ENCOUNTER — Other Ambulatory Visit: Payer: Self-pay

## 2019-11-16 ENCOUNTER — Ambulatory Visit (INDEPENDENT_AMBULATORY_CARE_PROVIDER_SITE_OTHER): Payer: Medicare Other | Admitting: Cardiology

## 2019-11-16 ENCOUNTER — Encounter: Payer: Self-pay | Admitting: Cardiology

## 2019-11-16 VITALS — BP 122/72 | HR 63 | Ht 70.0 in | Wt 204.2 lb

## 2019-11-16 DIAGNOSIS — I2510 Atherosclerotic heart disease of native coronary artery without angina pectoris: Secondary | ICD-10-CM

## 2019-11-16 DIAGNOSIS — I10 Essential (primary) hypertension: Secondary | ICD-10-CM | POA: Diagnosis not present

## 2019-11-16 DIAGNOSIS — Z951 Presence of aortocoronary bypass graft: Secondary | ICD-10-CM | POA: Diagnosis not present

## 2019-11-16 DIAGNOSIS — E785 Hyperlipidemia, unspecified: Secondary | ICD-10-CM | POA: Diagnosis not present

## 2019-11-16 NOTE — Telephone Encounter (Signed)
Jose Bowers from Cardiac Rehab returning call to [facility].

## 2019-11-16 NOTE — Telephone Encounter (Signed)
Spoke to [employer] states patient is on the wait list for starting cardiac rehab.  She will call to update patient.

## 2019-11-16 NOTE — Patient Instructions (Signed)
Medication Instructions:  Your physician recommends that you continue on your current medications as directed. Please refer to the Current Medication list given to you today.  *If you need a refill on your cardiac medications before your next appointment, please call your pharmacy*   Follow-Up: At [facility], you and your health needs are our priority.  As part of our continuing mission to provide you with exceptional heart care, we have created designated Provider Care Teams.  These Care Teams include your primary Cardiologist (physician) and Advanced Practice Providers (APPs -  Physician Assistants and Nurse Practitioners) who all work together to provide you with the care you need, when you need it.  We recommend signing up for the patient portal called "MyChart".  Sign up information is provided on this After Visit Summary.  MyChart is used to connect with patients for Virtual Visits (Telemedicine).  Patients are able to view lab/test results, encounter notes, upcoming appointments, etc.  Non-urgent messages can be sent to your provider as well.   To learn more about what you can do with MyChart, go to [email or web address].    Your next appointment:   3 month(s)  The format for your next appointment:   In Person  Provider:   Oswaldo Milian, MD   Other Instructions Phone number to Cardiac Rehab in [place] [phone]

## 2019-11-20 ENCOUNTER — Other Ambulatory Visit: Payer: Self-pay | Admitting: Surgery

## 2019-11-20 DIAGNOSIS — Z951 Presence of aortocoronary bypass graft: Secondary | ICD-10-CM

## 2019-11-21 ENCOUNTER — Encounter: Payer: Self-pay | Admitting: Surgery

## 2019-11-21 ENCOUNTER — Ambulatory Visit
Admission: RE | Admit: 2019-11-21 | Discharge: 2019-11-21 | Disposition: A | Discharge status: Home / Self Care | Payer: Medicare Other | Source: Ambulatory Visit | Attending: Surgery | Admitting: Surgery

## 2019-11-21 ENCOUNTER — Ambulatory Visit (INDEPENDENT_AMBULATORY_CARE_PROVIDER_SITE_OTHER): Payer: Self-pay | Admitting: Surgery

## 2019-11-21 ENCOUNTER — Other Ambulatory Visit: Payer: Self-pay

## 2019-11-21 VITALS — BP 111/74 | HR 77 | Temp 97.7°F | Resp 20 | Ht 70.0 in | Wt 202.0 lb

## 2019-11-21 DIAGNOSIS — Z951 Presence of aortocoronary bypass graft: Secondary | ICD-10-CM

## 2019-11-21 DIAGNOSIS — M47814 Spondylosis without myelopathy or radiculopathy, thoracic region: Secondary | ICD-10-CM | POA: Diagnosis not present

## 2019-11-21 NOTE — Progress Notes (Signed)
HPI: Patient returns for routine postoperative follow-up having undergone coronary bypass graft surgery x4 on 10/18/2019. The patient's early postoperative recovery while in the hospital was notable for an uncomplicated postoperative course. Since hospital discharge the patient reports that he has been feeling well.  He has been watching his diet closely and has lost 10 pounds.  He is now walking 2 to 3 miles per day without chest pain or shortness of breath and his stamina is improving.  He is anxious to begin cardiac rehab.   Current Outpatient Medications  Medication Sig Dispense Refill  . amoxicillin (AMOXIL) 500 MG capsule Take 2,000 mg by mouth See admin instructions. Take 2000 mg by mouth prior to any dental work done.    Marland Kitchen aspirin EC 81 MG EC tablet Take 1 tablet (81 mg total) by mouth daily. Swallow whole. 30 tablet 11  . atorvastatin (LIPITOR) 80 MG tablet Take 1 tablet (80 mg total) by mouth daily at 6 PM. 30 tablet 2  . fluocinonide cream (LIDEX) 0.09 % Apply 1 application topically daily as needed (itching/rash (Grover's disease)).    Marland Kitchen losartan-hydrochlorothiazide (HYZAAR) 50-12.5 MG tablet Take 1 tablet by mouth daily.  11  . metoprolol tartrate (LOPRESSOR) 25 MG tablet Take 1 tablet (25 mg total) by mouth 2 (two) times daily. 60 tablet 2  . nitroGLYCERIN (NITROSTAT) 0.4 MG SL tablet Place 1 tablet (0.4 mg total) under the tongue every 5 (five) minutes as needed for chest pain. 25 tablet 3  . XARELTO 20 MG TABS tablet Take 20 mg by mouth daily.     No current facility-administered medications for this visit.    Physical Exam: BP 111/74   Pulse 77   Temp 97.7 F (36.5 C) (Skin)   Resp 20   Ht 5\' 10"  (1.778 m)   Wt 202 lb (91.6 kg)   SpO2 95% Comment: RA  BMI 28.98 kg/m  He looks well. Cardiac exam shows a regular rate and rhythm with normal heart sounds. Lungs are clear. Chest incision is healing well and sternum is stable. The right leg incisions are healing  well.  There is a small subcutaneous hematoma in the lower medial thigh beneath the vein harvest incision.  This is nontender and there is no sign of infection.  He said that this developed after going home and has been slowly improving.  Diagnostic Tests:  CLINICAL DATA:  Status post coronary artery bypass grafting  EXAM: CHEST - 2 VIEW  COMPARISON:  October 20, 2019  FINDINGS: No evident pneumothorax. No edema or airspace opacity. Heart is upper normal in size with pulmonary vascularity normal. No adenopathy. Patient is status post coronary artery bypass grafting. There is degenerative change in the thoracic spine.  IMPRESSION: Lungs clear. Heart upper normal in size. Status post coronary artery bypass grafting.   Electronically Signed   By: Lowella Grip III M.D.   On: 11/21/2019 14:21  Impression:  Overall he is doing very well following coronary bypass surgery.  He is very motivated to modify his cardiac risk factors.  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  He would like to return to his job part-time and I told him that I thought that would be fine as long as he does not have to do any manual labor.  Plan:  He will continue to follow-up with cardiology and will return to  see me if he has any problems with his incisions.     Gaye Pollack, MD [facility] [phone]

## 2019-11-27 DIAGNOSIS — Z79899 Other long term (current) drug therapy: Secondary | ICD-10-CM | POA: Diagnosis not present

## 2019-11-27 DIAGNOSIS — Z951 Presence of aortocoronary bypass graft: Secondary | ICD-10-CM | POA: Diagnosis not present

## 2019-11-27 DIAGNOSIS — Z7982 Long term (current) use of aspirin: Secondary | ICD-10-CM | POA: Diagnosis not present

## 2019-11-28 DIAGNOSIS — Z951 Presence of aortocoronary bypass graft: Secondary | ICD-10-CM | POA: Diagnosis not present

## 2019-11-28 DIAGNOSIS — Z79899 Other long term (current) drug therapy: Secondary | ICD-10-CM | POA: Diagnosis not present

## 2019-11-28 DIAGNOSIS — Z7982 Long term (current) use of aspirin: Secondary | ICD-10-CM | POA: Diagnosis not present

## 2019-11-29 DIAGNOSIS — Z951 Presence of aortocoronary bypass graft: Secondary | ICD-10-CM | POA: Diagnosis not present

## 2019-11-29 DIAGNOSIS — Z79899 Other long term (current) drug therapy: Secondary | ICD-10-CM | POA: Diagnosis not present

## 2019-11-29 DIAGNOSIS — Z7982 Long term (current) use of aspirin: Secondary | ICD-10-CM | POA: Diagnosis not present

## 2019-12-03 DIAGNOSIS — Z79899 Other long term (current) drug therapy: Secondary | ICD-10-CM | POA: Diagnosis not present

## 2019-12-03 DIAGNOSIS — Z951 Presence of aortocoronary bypass graft: Secondary | ICD-10-CM | POA: Diagnosis not present

## 2019-12-03 DIAGNOSIS — Z7982 Long term (current) use of aspirin: Secondary | ICD-10-CM | POA: Diagnosis not present

## 2019-12-04 DIAGNOSIS — Z23 Encounter for immunization: Secondary | ICD-10-CM | POA: Diagnosis not present

## 2019-12-05 DIAGNOSIS — Z7982 Long term (current) use of aspirin: Secondary | ICD-10-CM | POA: Diagnosis not present

## 2019-12-05 DIAGNOSIS — Z951 Presence of aortocoronary bypass graft: Secondary | ICD-10-CM | POA: Diagnosis not present

## 2019-12-05 DIAGNOSIS — Z79899 Other long term (current) drug therapy: Secondary | ICD-10-CM | POA: Diagnosis not present

## 2019-12-06 DIAGNOSIS — Z951 Presence of aortocoronary bypass graft: Secondary | ICD-10-CM | POA: Diagnosis not present

## 2019-12-06 DIAGNOSIS — Z79899 Other long term (current) drug therapy: Secondary | ICD-10-CM | POA: Diagnosis not present

## 2019-12-06 DIAGNOSIS — Z7982 Long term (current) use of aspirin: Secondary | ICD-10-CM | POA: Diagnosis not present

## 2019-12-10 DIAGNOSIS — Z79899 Other long term (current) drug therapy: Secondary | ICD-10-CM | POA: Diagnosis not present

## 2019-12-10 DIAGNOSIS — Z951 Presence of aortocoronary bypass graft: Secondary | ICD-10-CM | POA: Diagnosis not present

## 2019-12-10 DIAGNOSIS — Z7982 Long term (current) use of aspirin: Secondary | ICD-10-CM | POA: Diagnosis not present

## 2019-12-12 DIAGNOSIS — Z79899 Other long term (current) drug therapy: Secondary | ICD-10-CM | POA: Diagnosis not present

## 2019-12-12 DIAGNOSIS — Z951 Presence of aortocoronary bypass graft: Secondary | ICD-10-CM | POA: Diagnosis not present

## 2019-12-12 DIAGNOSIS — Z7982 Long term (current) use of aspirin: Secondary | ICD-10-CM | POA: Diagnosis not present

## 2019-12-13 DIAGNOSIS — Z7982 Long term (current) use of aspirin: Secondary | ICD-10-CM | POA: Diagnosis not present

## 2019-12-13 DIAGNOSIS — Z79899 Other long term (current) drug therapy: Secondary | ICD-10-CM | POA: Diagnosis not present

## 2019-12-13 DIAGNOSIS — Z951 Presence of aortocoronary bypass graft: Secondary | ICD-10-CM | POA: Diagnosis not present

## 2019-12-20 ENCOUNTER — Ambulatory Visit: Payer: Medicare Other | Admitting: Cardiology

## 2019-12-20 DIAGNOSIS — Z79899 Other long term (current) drug therapy: Secondary | ICD-10-CM | POA: Diagnosis not present

## 2019-12-20 DIAGNOSIS — Z951 Presence of aortocoronary bypass graft: Secondary | ICD-10-CM | POA: Diagnosis not present

## 2019-12-20 DIAGNOSIS — Z7982 Long term (current) use of aspirin: Secondary | ICD-10-CM | POA: Diagnosis not present

## 2019-12-21 DIAGNOSIS — Z23 Encounter for immunization: Secondary | ICD-10-CM | POA: Diagnosis not present

## 2019-12-21 DIAGNOSIS — Z7189 Other specified counseling: Secondary | ICD-10-CM | POA: Diagnosis not present

## 2019-12-24 DIAGNOSIS — Z951 Presence of aortocoronary bypass graft: Secondary | ICD-10-CM | POA: Diagnosis not present

## 2019-12-24 DIAGNOSIS — Z7982 Long term (current) use of aspirin: Secondary | ICD-10-CM | POA: Diagnosis not present

## 2019-12-24 DIAGNOSIS — Z79899 Other long term (current) drug therapy: Secondary | ICD-10-CM | POA: Diagnosis not present

## 2019-12-25 DIAGNOSIS — E785 Hyperlipidemia, unspecified: Secondary | ICD-10-CM

## 2019-12-26 ENCOUNTER — Encounter: Payer: Self-pay | Admitting: Surgery

## 2019-12-26 DIAGNOSIS — Z951 Presence of aortocoronary bypass graft: Secondary | ICD-10-CM | POA: Diagnosis not present

## 2019-12-26 DIAGNOSIS — Z79899 Other long term (current) drug therapy: Secondary | ICD-10-CM | POA: Diagnosis not present

## 2019-12-26 DIAGNOSIS — Z7982 Long term (current) use of aspirin: Secondary | ICD-10-CM | POA: Diagnosis not present

## 2019-12-27 DIAGNOSIS — Z79899 Other long term (current) drug therapy: Secondary | ICD-10-CM | POA: Diagnosis not present

## 2019-12-27 DIAGNOSIS — Z7982 Long term (current) use of aspirin: Secondary | ICD-10-CM | POA: Diagnosis not present

## 2019-12-27 DIAGNOSIS — Z951 Presence of aortocoronary bypass graft: Secondary | ICD-10-CM | POA: Diagnosis not present

## 2019-12-31 DIAGNOSIS — Z951 Presence of aortocoronary bypass graft: Secondary | ICD-10-CM | POA: Diagnosis not present

## 2019-12-31 DIAGNOSIS — Z79899 Other long term (current) drug therapy: Secondary | ICD-10-CM | POA: Diagnosis not present

## 2019-12-31 DIAGNOSIS — Z7982 Long term (current) use of aspirin: Secondary | ICD-10-CM | POA: Diagnosis not present

## 2020-01-02 DIAGNOSIS — Z7982 Long term (current) use of aspirin: Secondary | ICD-10-CM | POA: Diagnosis not present

## 2020-01-02 DIAGNOSIS — Z951 Presence of aortocoronary bypass graft: Secondary | ICD-10-CM | POA: Diagnosis not present

## 2020-01-02 DIAGNOSIS — Z79899 Other long term (current) drug therapy: Secondary | ICD-10-CM | POA: Diagnosis not present

## 2020-01-03 DIAGNOSIS — Z7982 Long term (current) use of aspirin: Secondary | ICD-10-CM | POA: Diagnosis not present

## 2020-01-03 DIAGNOSIS — Z951 Presence of aortocoronary bypass graft: Secondary | ICD-10-CM | POA: Diagnosis not present

## 2020-01-03 DIAGNOSIS — Z79899 Other long term (current) drug therapy: Secondary | ICD-10-CM | POA: Diagnosis not present

## 2020-01-07 DIAGNOSIS — Z951 Presence of aortocoronary bypass graft: Secondary | ICD-10-CM | POA: Diagnosis not present

## 2020-01-07 DIAGNOSIS — Z7982 Long term (current) use of aspirin: Secondary | ICD-10-CM | POA: Diagnosis not present

## 2020-01-07 DIAGNOSIS — Z79899 Other long term (current) drug therapy: Secondary | ICD-10-CM | POA: Diagnosis not present

## 2020-01-09 DIAGNOSIS — Z7982 Long term (current) use of aspirin: Secondary | ICD-10-CM | POA: Diagnosis not present

## 2020-01-09 DIAGNOSIS — Z951 Presence of aortocoronary bypass graft: Secondary | ICD-10-CM | POA: Diagnosis not present

## 2020-01-09 DIAGNOSIS — Z79899 Other long term (current) drug therapy: Secondary | ICD-10-CM | POA: Diagnosis not present

## 2020-01-10 DIAGNOSIS — Z79899 Other long term (current) drug therapy: Secondary | ICD-10-CM | POA: Diagnosis not present

## 2020-01-10 DIAGNOSIS — Z951 Presence of aortocoronary bypass graft: Secondary | ICD-10-CM | POA: Diagnosis not present

## 2020-01-10 DIAGNOSIS — Z7982 Long term (current) use of aspirin: Secondary | ICD-10-CM | POA: Diagnosis not present

## 2020-01-12 ENCOUNTER — Other Ambulatory Visit: Payer: Self-pay | Admitting: Physician Assistant

## 2020-01-14 DIAGNOSIS — Z7982 Long term (current) use of aspirin: Secondary | ICD-10-CM | POA: Diagnosis not present

## 2020-01-14 DIAGNOSIS — Z951 Presence of aortocoronary bypass graft: Secondary | ICD-10-CM | POA: Diagnosis not present

## 2020-01-14 DIAGNOSIS — Z79899 Other long term (current) drug therapy: Secondary | ICD-10-CM | POA: Diagnosis not present

## 2020-01-15 NOTE — Telephone Encounter (Signed)
Let's recheck lipid panel

## 2020-01-18 DIAGNOSIS — E785 Hyperlipidemia, unspecified: Secondary | ICD-10-CM | POA: Diagnosis not present

## 2020-01-19 LAB — LIPID PANEL
Chol/HDL Ratio: 2.4 ratio (ref 0.0–5.0)
Cholesterol, Total: 114 mg/dL (ref 100–199)
HDL: 48 mg/dL (ref >39)
LDL Chol Calc (NIH): 51 mg/dL (ref 0–99)
Triglycerides: 74 mg/dL (ref 0–149)
VLDL Cholesterol Cal: 15 mg/dL (ref 5–40)

## 2020-01-21 MED ORDER — ATORVASTATIN CALCIUM 80 MG PO TABS
80.0000 mg | ORAL_TABLET | Freq: Every day | ORAL | 3 refills | Status: DC
Start: 2020-01-21 — End: 2021-01-09

## 2020-01-21 MED ORDER — METOPROLOL TARTRATE 25 MG PO TABS
25.0000 mg | ORAL_TABLET | Freq: Two times a day (BID) | ORAL | 3 refills | Status: DC
Start: 2020-01-21 — End: 2020-08-18

## 2020-01-21 NOTE — Addendum Note (Signed)
Addended by: Patria Mane A on: 01/21/2020 09:54 AM   Modules accepted: Orders

## 2020-02-18 ENCOUNTER — Ambulatory Visit (INDEPENDENT_AMBULATORY_CARE_PROVIDER_SITE_OTHER): Payer: Medicare Other | Admitting: Cardiology

## 2020-02-18 ENCOUNTER — Encounter: Payer: Self-pay | Admitting: Cardiology

## 2020-02-18 ENCOUNTER — Other Ambulatory Visit: Payer: Self-pay

## 2020-02-18 VITALS — BP 122/72 | HR 58 | Ht 70.0 in | Wt 204.0 lb

## 2020-02-18 DIAGNOSIS — E785 Hyperlipidemia, unspecified: Secondary | ICD-10-CM

## 2020-02-18 DIAGNOSIS — I10 Essential (primary) hypertension: Secondary | ICD-10-CM

## 2020-02-18 DIAGNOSIS — I2510 Atherosclerotic heart disease of native coronary artery without angina pectoris: Secondary | ICD-10-CM | POA: Diagnosis not present

## 2020-02-18 NOTE — Patient Instructions (Signed)
Medication Instructions:  Your physician recommends that you continue on your current medications as directed. Please refer to the Current Medication list given to you today.  *If you need a refill on your cardiac medications before your next appointment, please call your pharmacy*  Follow-Up: At [facility], you and your health needs are our priority.  As part of our continuing mission to provide you with exceptional heart care, we have created designated Provider Care Teams.  These Care Teams include your primary Cardiologist (physician) and Advanced Practice Providers (APPs -  Physician Assistants and Nurse Practitioners) who all work together to provide you with the care you need, when you need it.  We recommend signing up for the patient portal called "MyChart".  Sign up information is provided on this After Visit Summary.  MyChart is used to connect with patients for Virtual Visits (Telemedicine).  Patients are able to view lab/test results, encounter notes, upcoming appointments, etc.  Non-urgent messages can be sent to your provider as well.   To learn more about what you can do with MyChart, go to [email or web address].    Your next appointment:   6 month(s)  The format for your next appointment:   In Person  Provider:   Oswaldo Milian, MD   Other Instructions Dr. Gardiner Rhyme recommends: [employer]ron upper arm blood pressure cuff for home If you experience any lightheadedness, check your blood pressure.

## 2020-02-18 NOTE — Progress Notes (Signed)
Cardiology Office Note:    Date:  02/19/2020   ID:  Jose Bowers, DOB Jan 04, 1951, MRN [id number]  PCP:  Wenda Low, MD  Cardiologist:  Donato Heinz, MD  Electrophysiologist:  None   Referring MD: Wenda Low, MD   Chief Complaint  Patient presents with  . Coronary Artery Disease    History of Present Illness:    Jose Bowers is a 69 y.o. male with a hx of CAD status post CABG (LIMA-LAD, SVG-OM, SVG sequential to PDA and PLB) factor V Leiden, hypertension, hyperlipidemia, DVT following shoulder surgery in 2014, saddle PE treated with EKOS in 2019 who presents for follow-up.  He was referred by Dr. Lysle Rubens for evaluation of chest pain, initially seen on 09/19/2019.  Exercise Myoview was attempted on 10/04/2019.  He did not reach target heart rate on treadmill and was converted to pharmacologic study.  Despite not reaching target heart rate, he had 1.5 mm horizontal ST depressions in inferior and anterolateral leads and symptoms of chest pain.  MPI imaging showed reversible defect in apical anterior wall and apex consistent with LAD ischemia.  LVEF 49%.  Cardiac catheterization on 10/16/2019 showed severe heavily calcified multivessel disease (99% proximal LAD, 90% proximal circumflex, 80% distal RCA, 90% RPDA).  Underwent CABG 10/18/19 ((LIMA-LAD, SVG-OM, SVG sequential to PDA and PLB).  Echocardiogram 10/16/2019 showed normal biventricular function, no significant valvular disease.  Since last clinic visit, he reports that he has been doing well.  He is walking 3 to 5 miles per day.  He denies any chest pain or dyspnea.  Reports no issues with his incisions.  Denies any lightheadedness or syncope.  Does report occasional lightheadedness when he bends over.  Taking Xarelto, denies any bleeding issues.  Denies any lower extremity edema    Wt Readings from Last 3 Encounters:  02/18/20 204 lb (92.5 kg)  11/21/19 202 lb (91.6 kg)  11/16/19 204 lb 3.2 oz (92.6 kg)     Past  Medical History:  Diagnosis Date  . Factor V Leiden ([place])   . Hypertension     Past Surgical History:  Procedure Laterality Date  . CORONARY ARTERY BYPASS GRAFT N/A 10/18/2019   Procedure: CORONARY ARTERY BYPASS GRAFTING (CABG), ON PUMP, TIMES FOUR, USING LEFT INTERAL MAMMARY ATERY AND ENDOSCOPICALLY HARVESTED RIGHT GREATER SAPHENOUS VEIN;  Surgeon: Gaye Pollack, MD;  Location: [place];  Service: Open Heart Surgery;  Laterality: N/A;  . HERNIA REPAIR    . IR ANGIOGRAM PULMONARY BILATERAL SELECTIVE  03/19/2017  . IR ANGIOGRAM SELECTIVE EACH ADDITIONAL VESSEL  03/19/2017  . IR ANGIOGRAM SELECTIVE EACH ADDITIONAL VESSEL  03/19/2017  . IR INFUSION THROMBOL ARTERIAL INITIAL (MS)  03/19/2017  . IR INFUSION THROMBOL ARTERIAL INITIAL (MS)  03/19/2017  . IR THROMB F/U EVAL ART/VEN FINAL DAY (MS)  03/20/2017  . IR US GUIDE VASC ACCESS RIGHT  03/19/2017  . JOINT REPLACEMENT    . LEFT HEART CATH AND CORONARY ANGIOGRAPHY N/A 10/16/2019   Procedure: LEFT HEART CATH AND CORONARY ANGIOGRAPHY;  Surgeon: Leonie Man, MD;  Location: [place] [facility];  Service: Cardiovascular;  Laterality: N/A;  . ROTATOR CUFF REPAIR    . TEE WITHOUT CARDIOVERSION N/A 10/18/2019   Procedure: TRANSESOPHAGEAL ECHOCARDIOGRAM (TEE);  Surgeon: Gaye Pollack, MD;  Location: [place];  Service: Open Heart Surgery;  Laterality: N/A;    Current Medications: Current Meds  Medication Sig  . aspirin EC 81 MG EC tablet Take 1 tablet (81 mg total) by mouth daily.  Swallow whole.  Marland Kitchen atorvastatin (LIPITOR) 80 MG tablet Take 1 tablet (80 mg total) by mouth daily at 6 PM.  . fluocinonide cream (LIDEX) 9.50 % Apply 1 application topically daily as needed (itching/rash (Grover's disease)).  Marland Kitchen losartan-hydrochlorothiazide (HYZAAR) 50-12.5 MG tablet Take 1 tablet by mouth daily.  . metoprolol tartrate (LOPRESSOR) 25 MG tablet Take 1 tablet (25 mg total) by mouth 2 (two) times daily.  Alveda Reasons 20 MG TABS tablet Take 20 mg by mouth daily.      Allergies:   Patient has no known allergies.   Social History   Socioeconomic History  . Marital status: Married    Spouse name: Not on file  . Number of children: Not on file  . Years of education: Not on file  . Highest education level: Not on file  Occupational History  . Occupation: retired     Comment: Dietitian   Tobacco Use  . Smoking status: Never Smoker  . Smokeless tobacco: Never Used  Vaping Use  . Vaping Use: Never used  Substance and Sexual Activity  . Alcohol use: Yes    Comment: "few times a week"  . Drug use: No  . Sexual activity: Not on file  Other Topics Concern  . Not on file  Social History Narrative  . Not on file   Social Determinants of Health   Financial Resource Strain:   . Difficulty of Paying Living Expenses: Not on file  Food Insecurity:   . Worried About Charity fundraiser in the Last Year: Not on file  . Ran Out of Food in the Last Year: Not on file  Transportation Needs:   . Lack of Transportation (Medical): Not on file  . Lack of Transportation (Non-Medical): Not on file  Physical Activity:   . Days of Exercise per Week: Not on file  . Minutes of Exercise per Session: Not on file  Stress:   . Feeling of Stress : Not on file  Social Connections:   . Frequency of Communication with Friends and Family: Not on file  . Frequency of Social Gatherings with Friends and Family: Not on file  . Attends Religious Services: Not on file  . Active Member of Clubs or Organizations: Not on file  . Attends Archivist Meetings: Not on file  . Marital Status: Not on file     Family History: The patient's family history includes Clotting disorder in his brother, father, grandchild, and son.  ROS:   Please see the history of present illness.     All other systems reviewed and are negative.  EKGs/Labs/Other Studies Reviewed:    The following studies were reviewed today:   EKG:  EKG is not ordered today.  The ekg ordered  at prior clinic visit demonstrates normal sinus rhythm, rate 63, no ST abnormalities  Recent Labs: 10/19/2019: Magnesium 2.0 10/22/2019: BUN 11; Creatinine, Ser 1.05; Hemoglobin 12.1; Platelets 205; Potassium 3.9; Sodium 140  Recent Lipid Panel    Component Value Date/Time   CHOL 114 01/18/2020 0845   TRIG 74 01/18/2020 0845   HDL 48 01/18/2020 0845   CHOLHDL 2.4 01/18/2020 0845   CHOLHDL 3.6 10/18/2019 0511   VLDL 24 10/18/2019 0511   LDLCALC 51 01/18/2020 0845    Physical Exam:    VS:  BP 122/72   Pulse (!) 58   Ht 5\' 10"  (1.778 m)   Wt 204 lb (92.5 kg)   SpO2 97%   BMI 29.27 kg/m  Wt Readings from Last 3 Encounters:  02/18/20 204 lb (92.5 kg)  11/21/19 202 lb (91.6 kg)  11/16/19 204 lb 3.2 oz (92.6 kg)     GEN: in no acute distress HEENT: Normal NECK: No JVD; No carotid bruits CARDIAC: RRR, no murmurs, rubs, gallops RESPIRATORY:  Clear to auscultation without rales, wheezing or rhonchi  ABDOMEN: Soft, non-tender, non-distended MUSCULOSKELETAL:  No edema; area of induration adjacent to the incision and right lower leg.  No erythema, warmth, or tenderness. SKIN: Warm and dry NEUROLOGIC:  Alert and oriented x 3 PSYCHIATRIC:  Normal affect   ASSESSMENT:    1. CAD in native artery   2. Essential hypertension   3. Hyperlipidemia, unspecified hyperlipidemia type    PLAN:    CAD: Cardiac catheterization on 10/16/2019 showed severe heavily calcified multivessel disease (99% proximal LAD, 90% proximal circumflex, 80% distal RCA, 90% RPDA).  Underwent CABG 10/18/19 (LIMA-LAD, SVG-OM, SVG sequential to PDA and PLB).  Echocardiogram on 10/16/2019 showed normal biventricular function, no significant valvular disease. -Continue aspirin 81 mg daily -Continue atorvastatin 80 mg daily -Continue metoprolol 25 mg twice daily  Hypertension: On losartan-hydrochlorothiazide 50-12.5 mg daily and metoprolol 25 mg twice daily.  Appears controlled  Hyperlipidemia: On atorvastatin 80 mg  daily, LDL 51 on 01/18/2020  Factor V Leiden: History of DVT and PE.  On Xarelto  RTC in 6 months  Medication Adjustments/Labs and Tests Ordered: Current medicines are reviewed at length with the patient today.  Concerns regarding medicines are outlined above.  No orders of the defined types were placed in this encounter.  No orders of the defined types were placed in this encounter.   Patient Instructions  Medication Instructions:  Your physician recommends that you continue on your current medications as directed. Please refer to the Current Medication list given to you today.  *If you need a refill on your cardiac medications before your next appointment, please call your pharmacy*  Follow-Up: At [facility], you and your health needs are our priority.  As part of our continuing mission to provide you with exceptional heart care, we have created designated Provider Care Teams.  These Care Teams include your primary Cardiologist (physician) and Advanced Practice Providers (APPs -  Physician Assistants and Nurse Practitioners) who all work together to provide you with the care you need, when you need it.  We recommend signing up for the patient portal called "MyChart".  Sign up information is provided on this After Visit Summary.  MyChart is used to connect with patients for Virtual Visits (Telemedicine).  Patients are able to view lab/test results, encounter notes, upcoming appointments, etc.  Non-urgent messages can be sent to your provider as well.   To learn more about what you can do with MyChart, go to [email or web address].    Your next appointment:   6 month(s)  The format for your next appointment:   In Person  Provider:   Oswaldo Milian, MD   Other Instructions Dr. Gardiner Rhyme recommends: [employer] upper arm blood pressure cuff for home If you experience any lightheadedness, check your blood pressure.     Signed, Donato Heinz, MD  02/19/2020 12:35 AM     [place]

## 2020-05-30 DIAGNOSIS — E7800 Pure hypercholesterolemia, unspecified: Secondary | ICD-10-CM | POA: Diagnosis not present

## 2020-05-30 DIAGNOSIS — I10 Essential (primary) hypertension: Secondary | ICD-10-CM | POA: Diagnosis not present

## 2020-05-30 DIAGNOSIS — Z0000 Encounter for general adult medical examination without abnormal findings: Secondary | ICD-10-CM | POA: Diagnosis not present

## 2020-05-30 DIAGNOSIS — I2699 Other pulmonary embolism without acute cor pulmonale: Secondary | ICD-10-CM | POA: Diagnosis not present

## 2020-05-30 DIAGNOSIS — I2510 Atherosclerotic heart disease of native coronary artery without angina pectoris: Secondary | ICD-10-CM | POA: Diagnosis not present

## 2020-05-30 DIAGNOSIS — R7303 Prediabetes: Secondary | ICD-10-CM | POA: Diagnosis not present

## 2020-05-30 DIAGNOSIS — Z1389 Encounter for screening for other disorder: Secondary | ICD-10-CM | POA: Diagnosis not present

## 2020-05-30 DIAGNOSIS — R972 Elevated prostate specific antigen [PSA]: Secondary | ICD-10-CM | POA: Diagnosis not present

## 2020-05-30 DIAGNOSIS — D682 Hereditary deficiency of other clotting factors: Secondary | ICD-10-CM | POA: Diagnosis not present

## 2020-05-30 DIAGNOSIS — Z951 Presence of aortocoronary bypass graft: Secondary | ICD-10-CM | POA: Diagnosis not present

## 2020-06-21 DIAGNOSIS — H2513 Age-related nuclear cataract, bilateral: Secondary | ICD-10-CM | POA: Diagnosis not present

## 2020-07-26 DIAGNOSIS — Z1152 Encounter for screening for COVID-19: Secondary | ICD-10-CM | POA: Diagnosis not present

## 2020-08-05 DIAGNOSIS — Z23 Encounter for immunization: Secondary | ICD-10-CM | POA: Diagnosis not present

## 2020-08-17 NOTE — Progress Notes (Deleted)
Cardiology Office Note:    Date:  08/18/2020   ID:  Jose Bowers, DOB 1950/06/03, MRN [id number]  PCP:  Jose Low, MD  Cardiologist:  Jose Heinz, MD  Electrophysiologist:  None   Referring MD: Jose Low, MD   No chief complaint on file.   History of Present Illness:    Jose Bowers is a 70 y.o. male with a hx of CAD status post CABG (LIMA-LAD, SVG-OM, SVG sequential to PDA and PLB) factor V Leiden, hypertension, hyperlipidemia, DVT following shoulder surgery in 2014, saddle PE treated with EKOS in 2019 who presents for follow-up.  He was referred by Dr. Lysle Bowers for evaluation of chest pain, initially seen on 09/19/2019.  Exercise Myoview was attempted on 10/04/2019.  He did not reach target heart rate on treadmill and was converted to pharmacologic study.  Despite not reaching target heart rate, he had 1.5 mm horizontal ST depressions in inferior and anterolateral leads and symptoms of chest pain.  MPI imaging showed reversible defect in apical anterior wall and apex consistent with LAD ischemia.  LVEF 49%.  Cardiac catheterization on 10/16/2019 showed severe heavily calcified multivessel disease (99% proximal LAD, 90% proximal circumflex, 80% distal RCA, 90% RPDA).  Underwent CABG 10/18/19 ((LIMA-LAD, SVG-OM, SVG sequential to PDA and PLB).  Echocardiogram 10/16/2019 showed normal biventricular function, no significant valvular disease.  Since last clinic visit,   he reports that he has been doing well.  He is walking 3 to 5 miles per day.  He denies any chest pain or dyspnea.  Reports no issues with his incisions.  Denies any lightheadedness or syncope.  Does report occasional lightheadedness when he bends over.  Taking Xarelto, denies any bleeding issues.  Denies any lower extremity edema    Wt Readings from Last 3 Encounters:  08/18/20 197 lb (89.4 kg)  02/18/20 204 lb (92.5 kg)  11/21/19 202 lb (91.6 kg)     Past Medical History:  Diagnosis Date  . Factor V  Leiden ([place])   . Hypertension     Past Surgical History:  Procedure Laterality Date  . CORONARY ARTERY BYPASS GRAFT N/A 10/18/2019   Procedure: CORONARY ARTERY BYPASS GRAFTING (CABG), ON PUMP, TIMES FOUR, USING LEFT INTERAL MAMMARY ATERY AND ENDOSCOPICALLY HARVESTED RIGHT GREATER SAPHENOUS VEIN;  Surgeon: Jose Pollack, MD;  Location: [place];  Service: Open Heart Surgery;  Laterality: N/A;  . HERNIA REPAIR    . IR ANGIOGRAM PULMONARY BILATERAL SELECTIVE  03/19/2017  . IR ANGIOGRAM SELECTIVE EACH ADDITIONAL VESSEL  03/19/2017  . IR ANGIOGRAM SELECTIVE EACH ADDITIONAL VESSEL  03/19/2017  . IR INFUSION THROMBOL ARTERIAL INITIAL (MS)  03/19/2017  . IR INFUSION THROMBOL ARTERIAL INITIAL (MS)  03/19/2017  . IR THROMB F/U EVAL ART/VEN FINAL DAY (MS)  03/20/2017  . IR US GUIDE VASC ACCESS RIGHT  03/19/2017  . JOINT REPLACEMENT    . LEFT HEART CATH AND CORONARY ANGIOGRAPHY N/A 10/16/2019   Procedure: LEFT HEART CATH AND CORONARY ANGIOGRAPHY;  Surgeon: Jose Man, MD;  Location: [place] [facility];  Service: Cardiovascular;  Laterality: N/A;  . ROTATOR CUFF REPAIR    . TEE WITHOUT CARDIOVERSION N/A 10/18/2019   Procedure: TRANSESOPHAGEAL ECHOCARDIOGRAM (TEE);  Surgeon: Jose Pollack, MD;  Location: [place];  Service: Open Heart Surgery;  Laterality: N/A;    Current Medications: Current Meds  Medication Sig  . aspirin EC 81 MG EC tablet Take 1 tablet (81 mg total) by mouth daily. Swallow whole.  Marland Kitchen atorvastatin (LIPITOR) 80  MG tablet Take 1 tablet (80 mg total) by mouth daily at 6 PM.  . fluocinonide cream (LIDEX) 0.26 % Apply 1 application topically daily as needed (itching/rash (Grover's disease)).  Marland Kitchen losartan-hydrochlorothiazide (HYZAAR) 50-12.5 MG tablet Take 1 tablet by mouth daily.  . metoprolol tartrate (LOPRESSOR) 25 MG tablet Take 1 tablet (25 mg total) by mouth 2 (two) times daily.  Alveda Reasons 20 MG TABS tablet Take 20 mg by mouth daily.     Allergies:   Patient has no known allergies.   Social  History   Socioeconomic History  . Marital status: Married    Spouse name: Not on file  . Number of children: Not on file  . Years of education: Not on file  . Highest education level: Not on file  Occupational History  . Occupation: retired     Comment: Dietitian   Tobacco Use  . Smoking status: Never Smoker  . Smokeless tobacco: Never Used  Vaping Use  . Vaping Use: Never used  Substance and Sexual Activity  . Alcohol use: Yes    Comment: "few times a week"  . Drug use: No  . Sexual activity: Not on file  Other Topics Concern  . Not on file  Social History Narrative  . Not on file   Social Determinants of Health   Financial Resource Strain: Not on file  Food Insecurity: Not on file  Transportation Needs: Not on file  Physical Activity: Not on file  Stress: Not on file  Social Connections: Not on file     Family History: The patient's family history includes Clotting disorder in his brother, father, grandchild, and son.  ROS:   Please see the history of present illness.     All other systems reviewed and are negative.  EKGs/Labs/Other Studies Reviewed:    The following studies were reviewed today:   EKG:  EKG is not ordered today.  The ekg ordered at prior clinic visit demonstrates normal sinus rhythm, rate 63, no ST abnormalities  Recent Labs: 10/19/2019: Magnesium 2.0 10/22/2019: BUN 11; Creatinine, Ser 1.05; Hemoglobin 12.1; Platelets 205; Potassium 3.9; Sodium 140  Recent Lipid Panel    Component Value Date/Time   CHOL 114 01/18/2020 0845   TRIG 74 01/18/2020 0845   HDL 48 01/18/2020 0845   CHOLHDL 2.4 01/18/2020 0845   CHOLHDL 3.6 10/18/2019 0511   VLDL 24 10/18/2019 0511   LDLCALC 51 01/18/2020 0845    Physical Exam:    VS:  BP 124/72   Ht 5\' 10"  (1.778 m)   Wt 197 lb (89.4 kg)   SpO2 96%   BMI 28.27 kg/m     Wt Readings from Last 3 Encounters:  08/18/20 197 lb (89.4 kg)  02/18/20 204 lb (92.5 kg)  11/21/19 202 lb (91.6 kg)      GEN: in no acute distress HEENT: Normal NECK: No JVD; No carotid bruits CARDIAC: RRR, no murmurs, rubs, gallops RESPIRATORY:  Clear to auscultation without rales, wheezing or rhonchi  ABDOMEN: Soft, non-tender, non-distended MUSCULOSKELETAL:  No edema; area of induration adjacent to the incision and right lower leg.  No erythema, warmth, or tenderness. SKIN: Warm and dry NEUROLOGIC:  Alert and oriented x 3 PSYCHIATRIC:  Normal affect   ASSESSMENT:    No diagnosis found. PLAN:    CAD: Cardiac catheterization on 10/16/2019 showed severe heavily calcified multivessel disease (99% proximal LAD, 90% proximal circumflex, 80% distal RCA, 90% RPDA).  Underwent CABG 10/18/19 (LIMA-LAD, SVG-OM, SVG sequential to  PDA and PLB).  Echocardiogram on 10/16/2019 showed normal biventricular function, no significant valvular disease. -Continue aspirin 81 mg daily -Continue atorvastatin 80 mg daily -Continue metoprolol 25, will consolidate to Toprol-XL 25 mg daily  Hypertension: On losartan-hydrochlorothiazide 50-12.5 mg daily and metoprolol 25 mg twice daily.  Appears controlled  Hyperlipidemia: On atorvastatin 80 mg daily, LDL 51 on 01/18/2020  Factor V Leiden: History of DVT and PE.  On Xarelto    RTC in 6 months  Medication Adjustments/Labs and Tests Ordered: Current medicines are reviewed at length with the patient today.  Concerns regarding medicines are outlined above.  No orders of the defined types were placed in this encounter.  No orders of the defined types were placed in this encounter.   There are no Patient Instructions on file for this visit.   Signed, Jose Heinz, MD  08/18/2020 9:46 AM    [place]

## 2020-08-18 ENCOUNTER — Encounter: Payer: Self-pay | Admitting: Cardiology

## 2020-08-18 ENCOUNTER — Ambulatory Visit (INDEPENDENT_AMBULATORY_CARE_PROVIDER_SITE_OTHER): Payer: Medicare Other | Admitting: Cardiology

## 2020-08-18 ENCOUNTER — Other Ambulatory Visit: Payer: Self-pay

## 2020-08-18 VITALS — BP 124/72 | Ht 70.0 in | Wt 197.0 lb

## 2020-08-18 DIAGNOSIS — I2510 Atherosclerotic heart disease of native coronary artery without angina pectoris: Secondary | ICD-10-CM

## 2020-08-18 DIAGNOSIS — I10 Essential (primary) hypertension: Secondary | ICD-10-CM | POA: Diagnosis not present

## 2020-08-18 DIAGNOSIS — E785 Hyperlipidemia, unspecified: Secondary | ICD-10-CM

## 2020-08-18 MED ORDER — METOPROLOL SUCCINATE ER 25 MG PO TB24: 25.0000 mg | ORAL_TABLET | Freq: Every day | ORAL | 3 refills | Status: DC

## 2020-08-18 NOTE — Progress Notes (Signed)
Cardiology Office Note:    Date:  08/18/2020   ID:  Jose Bowers, DOB 09/21/1950, MRN [id number]  PCP:  Wenda Low, MD  Cardiologist:  Donato Heinz, MD  Electrophysiologist:  None   Referring MD: Wenda Low, MD   Chief Complaint  Patient presents with  . Coronary Artery Disease    History of Present Illness:    Jose Bowers is a 70 y.o. male with a hx of CAD status post CABG (LIMA-LAD, SVG-OM, SVG sequential to PDA and PLB) factor V Leiden, hypertension, hyperlipidemia, DVT following shoulder surgery in 2014, saddle PE treated with EKOS in 2019 who presents for follow-up.  He was referred by Dr. Lysle Rubens for evaluation of chest pain, initially seen on 09/19/2019.  Exercise Myoview was attempted on 10/04/2019.  He did not reach target heart rate on treadmill and was converted to pharmacologic study.  Despite not reaching target heart rate, he had 1.5 mm horizontal ST depressions in inferior and anterolateral leads and symptoms of chest pain.  MPI imaging showed reversible defect in apical anterior wall and apex consistent with LAD ischemia.  LVEF 49%.  Cardiac catheterization on 10/16/2019 showed severe heavily calcified multivessel disease (99% proximal LAD, 90% proximal circumflex, 80% distal RCA, 90% RPDA).  Underwent CABG 10/18/19 ((LIMA-LAD, SVG-OM, SVG sequential to PDA and PLB).  Echocardiogram 10/16/2019 showed normal biventricular function, no significant valvular disease.  Since last clinic visit, he has been feeling great overall, and healed well from his CABG. Today, his main complaint is pruritis along his incision. He has lost 30 pounds in total these past 10 months. His current goal is 185 pounds. He is not having any chest pains. In hot weather he can feel a little short of breath but nothing restrictive or worrisome. At home his blood pressure typically averages in the 120s.  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 He remains compliant with his medications. He denies any palpitations, or exertional symptoms. No headaches, lightheadedness, or syncope to report. Also has no lower extremity edema, orthopnea or PND.    Wt Readings from Last 3 Encounters:  08/18/20 197 lb (89.4 kg)  02/18/20 204 lb (92.5 kg)  11/21/19 202 lb (91.6 kg)     Past Medical History:  Diagnosis Date  . Factor V Leiden ([place])   . Hypertension     Past Surgical History:  Procedure Laterality Date  . CORONARY ARTERY BYPASS GRAFT N/A 10/18/2019   Procedure: CORONARY ARTERY BYPASS GRAFTING (CABG), ON PUMP, TIMES FOUR, USING LEFT INTERAL MAMMARY ATERY AND ENDOSCOPICALLY HARVESTED RIGHT GREATER SAPHENOUS VEIN;  Surgeon: Gaye Pollack, MD;  Location: [facility];  Service: Open Heart Surgery;  Laterality: N/A;  . HERNIA REPAIR    . IR ANGIOGRAM PULMONARY BILATERAL SELECTIVE  03/19/2017  . IR ANGIOGRAM SELECTIVE EACH ADDITIONAL VESSEL  03/19/2017  . IR ANGIOGRAM SELECTIVE EACH ADDITIONAL VESSEL  03/19/2017  . IR INFUSION THROMBOL ARTERIAL INITIAL (MS)  03/19/2017  . IR INFUSION THROMBOL ARTERIAL INITIAL (MS)  03/19/2017  . IR THROMB F/U EVAL ART/VEN FINAL DAY (MS)  03/20/2017  . IR US GUIDE VASC ACCESS RIGHT  03/19/2017  . JOINT REPLACEMENT    . LEFT HEART CATH AND CORONARY ANGIOGRAPHY N/A 10/16/2019   Procedure: LEFT HEART CATH AND CORONARY ANGIOGRAPHY;  Surgeon: Leonie Man, MD;  Location: [facility];  Service: Cardiovascular;  Laterality: N/A;  . ROTATOR CUFF REPAIR    .  TEE WITHOUT CARDIOVERSION N/A 10/18/2019   Procedure: TRANSESOPHAGEAL ECHOCARDIOGRAM (TEE);  Surgeon: Gaye Pollack, MD;  Location: [facility];  Service: Open Heart Surgery;  Laterality: N/A;    Current Medications: Current Meds  Medication Sig  . aspirin EC 81 MG EC tablet Take 1 tablet (81 mg total) by mouth daily. Swallow whole.  Marland Kitchen atorvastatin (LIPITOR) 80 MG tablet Take 1 tablet (80 mg total) by mouth daily at 6 PM.  . fluocinonide cream (LIDEX)  7.41 % Apply 1 application topically daily as needed (itching/rash (Grover's disease)).  Marland Kitchen losartan-hydrochlorothiazide (HYZAAR) 50-12.5 MG tablet Take 1 tablet by mouth daily.  . metoprolol succinate (TOPROL XL) 25 MG 24 hr tablet Take 1 tablet (25 mg total) by mouth daily.  Alveda Reasons 20 MG TABS tablet Take 20 mg by mouth daily.  . [DISCONTINUED] metoprolol tartrate (LOPRESSOR) 25 MG tablet Take 1 tablet (25 mg total) by mouth 2 (two) times daily.     Allergies:   Patient has no known allergies.   Social History   Socioeconomic History  . Marital status: Married    Spouse name: Not on file  . Number of children: Not on file  . Years of education: Not on file  . Highest education level: Not on file  Occupational History  . Occupation: retired     Comment: Dietitian   Tobacco Use  . Smoking status: Never Smoker  . Smokeless tobacco: Never Used  Vaping Use  . Vaping Use: Never used  Substance and Sexual Activity  . Alcohol use: Yes    Comment: "few times a week"  . Drug use: No  . Sexual activity: Not on file  Other Topics Concern  . Not on file  Social History Narrative  . Not on file   Social Determinants of Health   Financial Resource Strain: Not on file  Food Insecurity: Not on file  Transportation Needs: Not on file  Physical Activity: Not on file  Stress: Not on file  Social Connections: Not on file     Family History: The patient's family history includes Clotting disorder in his brother, father, grandchild, and son.  ROS:   Please see the history of present illness.    (+) Shortness of breath (+) Pruritis along incision All other systems reviewed and are negative.  EKGs/Labs/Other Studies Reviewed:    The following studies were reviewed today:  Intraoperative TEE 10/18/2019: - Left Ventricle: The left ventricle is essentially unchanged from  pre-bypass.  - Right Ventricle: The right ventricle appears unchanged from pre-bypass.  - Aorta: The  aorta appears unchanged from pre-bypass.  - Left Atrium: The left atrium appears unchanged from pre-bypass.  - Left Atrial Appendage: The left atrial appendage appears unchanged from  pre-bypass.  - Aortic Valve: The aortic valve appears unchanged from pre-bypass.  - Mitral Valve: The mitral valve appears essentially unchanged from  pre-bypass.  - Tricuspid Valve: The tricuspid valve appears essentially unchanged from  pre-bypass.  - Interatrial Septum: The interatrial septum appears unchanged from  pre-bypass.  - Interventricular Septum: The interventricular septum appears unchanged  from  pre-bypass.  - Pericardium: The pericardium appears unchanged from pre-bypass.   US Doppler Pre CABG 10/17/2019: Right Carotid: Velocities in the right ICA are consistent with a 1-39%  stenosis.  Left Carotid: Velocities in the left ICA are consistent with a 1-39%  stenosis.  Vertebrals: Bilateral vertebral arteries demonstrate antegrade flow.  Subclavians: Normal flow hemodynamics were seen in bilateral subclavian arteries.  Right Upper Extremity: Doppler waveforms remain within normal limits with right radial compression. Doppler waveforms remain within normal limits with right ulnar compression.  Left Upper Extremity: Doppler waveforms remain within normal limits with left radial compression. Doppler waveforms remain within normal limits with left ulnar compression.   Echo 10/16/2019: 1. Left ventricular ejection fraction, by estimation, is 55 to 60%. The  left ventricle has normal function. The left ventricle has no regional  wall motion abnormalities. There is mild left ventricular hypertrophy of  the basal-septal segment. Left  ventricular diastolic parameters are consistent with Grade I diastolic  dysfunction (impaired relaxation).  2. Right ventricular systolic function is normal. The right ventricular  size is normal. Tricuspid regurgitation signal is inadequate for assessing  PA  pressure.  3. Left atrial size was mildly dilated.  4. The mitral valve is normal in structure. No evidence of mitral valve  regurgitation. No evidence of mitral stenosis.  5. The aortic valve is normal in structure. Aortic valve regurgitation is  not visualized. No aortic stenosis is present.  6. Aortic dilatation noted. There is borderline dilatation of the aortic  root measuring 38 mm.  7. The inferior vena cava is normal in size with greater than 50%  respiratory variability, suggesting right atrial pressure of 3 mmHg.   Comparison(s): No significant change from prior study. Prior images  reviewed side by side.   LHC 10/16/2019:  -- LEFT VENTRICULAR HEMODYNAMICS & VENTRICULOGRAPHY  There is mild left ventricular systolic dysfunction.  LV end diastolic pressure is normal.  The left ventricular ejection fraction is 45-50% by visual estimate.  -- CORONARY ANGIOGRAPHY  Prox LAD to Mid LAD lesion is 99% stenosed with 50% stenosed side branch in 1st Diag.  Prox Cx lesion is 90% stenosed.  Dist RCA lesion is 80% stenosed with 90% stenosed side branch in RPDA. SUMMARY   Severe heavily calcified multivessel disease:  Heavily calcified proximal LAD subtotal occlusion (99%) lesion beginning just prior to major SP trunk, terminating after1st Diag -->remainder the LAD is relatively free of disease with TIMI I flow and competitive flow from right left collaterals to the apex  Heavily calcified very focal 90% eccentric stenosis in the proximal LCx--LCx gives off 2 major Mrgbranches and a posterior AV groove branch with 2 small PL branches--free of disease.  Heavily calcified dominant RCA with diffuse 30% mid to distal disease, and 80% focal stenosis just prior to RPAV continuing into the RPDA with 90% ostial PDA lesion.  Mildly reduced LVEF of roughly 45% with mid to apical anterior hypokinesis RECOMMENDATION  Admit under Dr. Lucienne Capers for medication optimization and  CVTS consultation  He will be started on IV Heparin while we continue to hold his Xarelto.  Increase to Max dose Atorvastatin & adjust BP medications    Lexiscan Stress Test 10/04/2019:  Nuclear stress EF: 49%.  The left ventricular ejection fraction is mildly decreased (45-54%).  Horizontal ST segment depression ST segment depression of 1.5 mm was noted during stress in the II, III, aVF, V5 and V6 leads, beginning at 7 minutes of stress, and returning to baseline after less than 1 minute of recovery.  Findings consistent with ischemia.  This is a high risk study.   1. Initial ECG treadmill stress did not reach target HR and was converted to pharmacological study. Despite not reaching target HR, the patient had 1.5 mm horizontal ST depression in inferior leads (II, III, AVF) and anterolateral leads (V5-6) and symptoms of chest pain. 2. On  MPI imaging, there is a medium size (10-12% of the LV), moderate perfusion defect present on stress imaging in the apical anterior, and apex consistent with ischemia in the LAD distrubution.  3. LVEF is normal for this modality, 49%.  4. Average exercise capacity (8:11 min:s; 7.0 METS). 5. Overall, this is a high risk study concerning for ischemia.  EKG:   08/18/2020: NSR, rate 57, No ST abnormalities 11/16/2019: normal sinus rhythm, rate 63, no ST abnormalities 10/09/2019: NSR, rate 72, no ST abnormalities  Recent Labs: 10/19/2019: Magnesium 2.0 10/22/2019: BUN 11; Creatinine, Ser 1.05; Hemoglobin 12.1; Platelets 205; Potassium 3.9; Sodium 140  Recent Lipid Panel    Component Value Date/Time   CHOL 114 01/18/2020 0845   TRIG 74 01/18/2020 0845   HDL 48 01/18/2020 0845   CHOLHDL 2.4 01/18/2020 0845   CHOLHDL 3.6 10/18/2019 0511   VLDL 24 10/18/2019 0511   LDLCALC 51 01/18/2020 0845    Physical Exam:    VS:  BP 124/72   Ht 5\' 10"  (1.778 m)   Wt 197 lb (89.4 kg)   SpO2 96%   BMI 28.27 kg/m     Wt Readings from Last 3 Encounters:  08/18/20  197 lb (89.4 kg)  02/18/20 204 lb (92.5 kg)  11/21/19 202 lb (91.6 kg)     GEN: in no acute distress HEENT: Normal NECK: No JVD; No carotid bruits CARDIAC: RRR, no murmurs, rubs, gallops RESPIRATORY:  Clear to auscultation without rales, wheezing or rhonchi  ABDOMEN: Soft, non-tender, non-distended MUSCULOSKELETAL:  No edema SKIN: Warm and dry NEUROLOGIC:  Alert and oriented x 3 PSYCHIATRIC:  Normal affect   ASSESSMENT:    1. CAD in native artery   2. Essential hypertension   3. Hyperlipidemia, unspecified hyperlipidemia type    PLAN:    CAD: Cardiac catheterization on 10/16/2019 showed severe heavily calcified multivessel disease (99% proximal LAD, 90% proximal circumflex, 80% distal RCA, 90% RPDA).  Underwent CABG 10/18/19 (LIMA-LAD, SVG-OM, SVG sequential to PDA and PLB).  Echocardiogram on 10/16/2019 showed normal biventricular function, no significant valvular disease. -Continue aspirin 81 mg daily -Continue atorvastatin 80 mg daily -Continue metoprolol 25, will consolidate to Toprol-XL 25 mg daily  Hypertension: On losartan-hydrochlorothiazide 50-12.5 mg daily and metoprolol 25 mg twice daily.  Appears controlled  Hyperlipidemia: On atorvastatin 80 mg daily, LDL 51 on 01/18/2020  Factor V Leiden: History of DVT and PE.  On Xarelto    RTC in 6 months   Medication Adjustments/Labs and Tests Ordered: Current medicines are reviewed at length with the patient today.  Concerns regarding medicines are outlined above.  Orders Placed This Encounter  Procedures  . EKG 12-Lead   Meds ordered this encounter  Medications  . metoprolol succinate (TOPROL XL) 25 MG 24 hr tablet    Sig: Take 1 tablet (25 mg total) by mouth daily.    Dispense:  90 tablet    Refill:  3    STOP Lopressor    Patient Instructions  Medication Instructions:  STOP metoprolol tartrate (Lopressor)  START metoprolol succinate (Toprol XL) 25 mg once daily  *If you need a refill on your cardiac medications  before your next appointment, please call your pharmacy*  Follow-Up: At [facility], you and your health needs are our priority.  As part of our continuing mission to provide you with exceptional heart care, we have created designated Provider Care Teams.  These Care Teams include your primary Cardiologist (physician) and Advanced Practice Providers (APPs -  Physician  Assistants and Nurse Practitioners) who all work together to provide you with the care you need, when you need it.  We recommend signing up for the patient portal called "MyChart".  Sign up information is provided on this After Visit Summary.  MyChart is used to connect with patients for Virtual Visits (Telemedicine).  Patients are able to view lab/test results, encounter notes, upcoming appointments, etc.  Non-urgent messages can be sent to your provider as well.   To learn more about what you can do with MyChart, go to [email or web address].    Your next appointment:   6 month(s)  The format for your next appointment:   In Person  Provider:   Oswaldo Milian, MD      [facility],acting as a scribe for Donato Heinz, MD.,have documented all relevant documentation on the behalf of Donato Heinz, MD,as directed by  Donato Heinz, MD while in the presence of Donato Heinz, MD.  I, Donato Heinz, MD, have reviewed all documentation for this visit. The documentation on 08/18/20 for the exam, diagnosis, procedures, and orders are all accurate and complete.   Signed, Donato Heinz, MD  08/18/2020 5:19 PM    [place]

## 2020-08-18 NOTE — Patient Instructions (Signed)
Medication Instructions:  STOP metoprolol tartrate (Lopressor)  START metoprolol succinate (Toprol XL) 25 mg once daily  *If you need a refill on your cardiac medications before your next appointment, please call your pharmacy*  Follow-Up: At [facility], you and your health needs are our priority.  As part of our continuing mission to provide you with exceptional heart care, we have created designated Provider Care Teams.  These Care Teams include your primary Cardiologist (physician) and Advanced Practice Providers (APPs -  Physician Assistants and Nurse Practitioners) who all work together to provide you with the care you need, when you need it.  We recommend signing up for the patient portal called "MyChart".  Sign up information is provided on this After Visit Summary.  MyChart is used to connect with patients for Virtual Visits (Telemedicine).  Patients are able to view lab/test results, encounter notes, upcoming appointments, etc.  Non-urgent messages can be sent to your provider as well.   To learn more about what you can do with MyChart, go to [email or web address].    Your next appointment:   6 month(s)  The format for your next appointment:   In Person  Provider:   Oswaldo Milian, MD

## 2020-08-19 IMAGING — DX DG CHEST 1V PORT
1 series · 1 of 1 positions shown · non-contrast
Comparison: October 17, 2019

CLINICAL DATA: Hypoxia. Status post coronary artery bypass
grafting.

EXAM:
PORTABLE CHEST 1 VIEW

[chest ap]
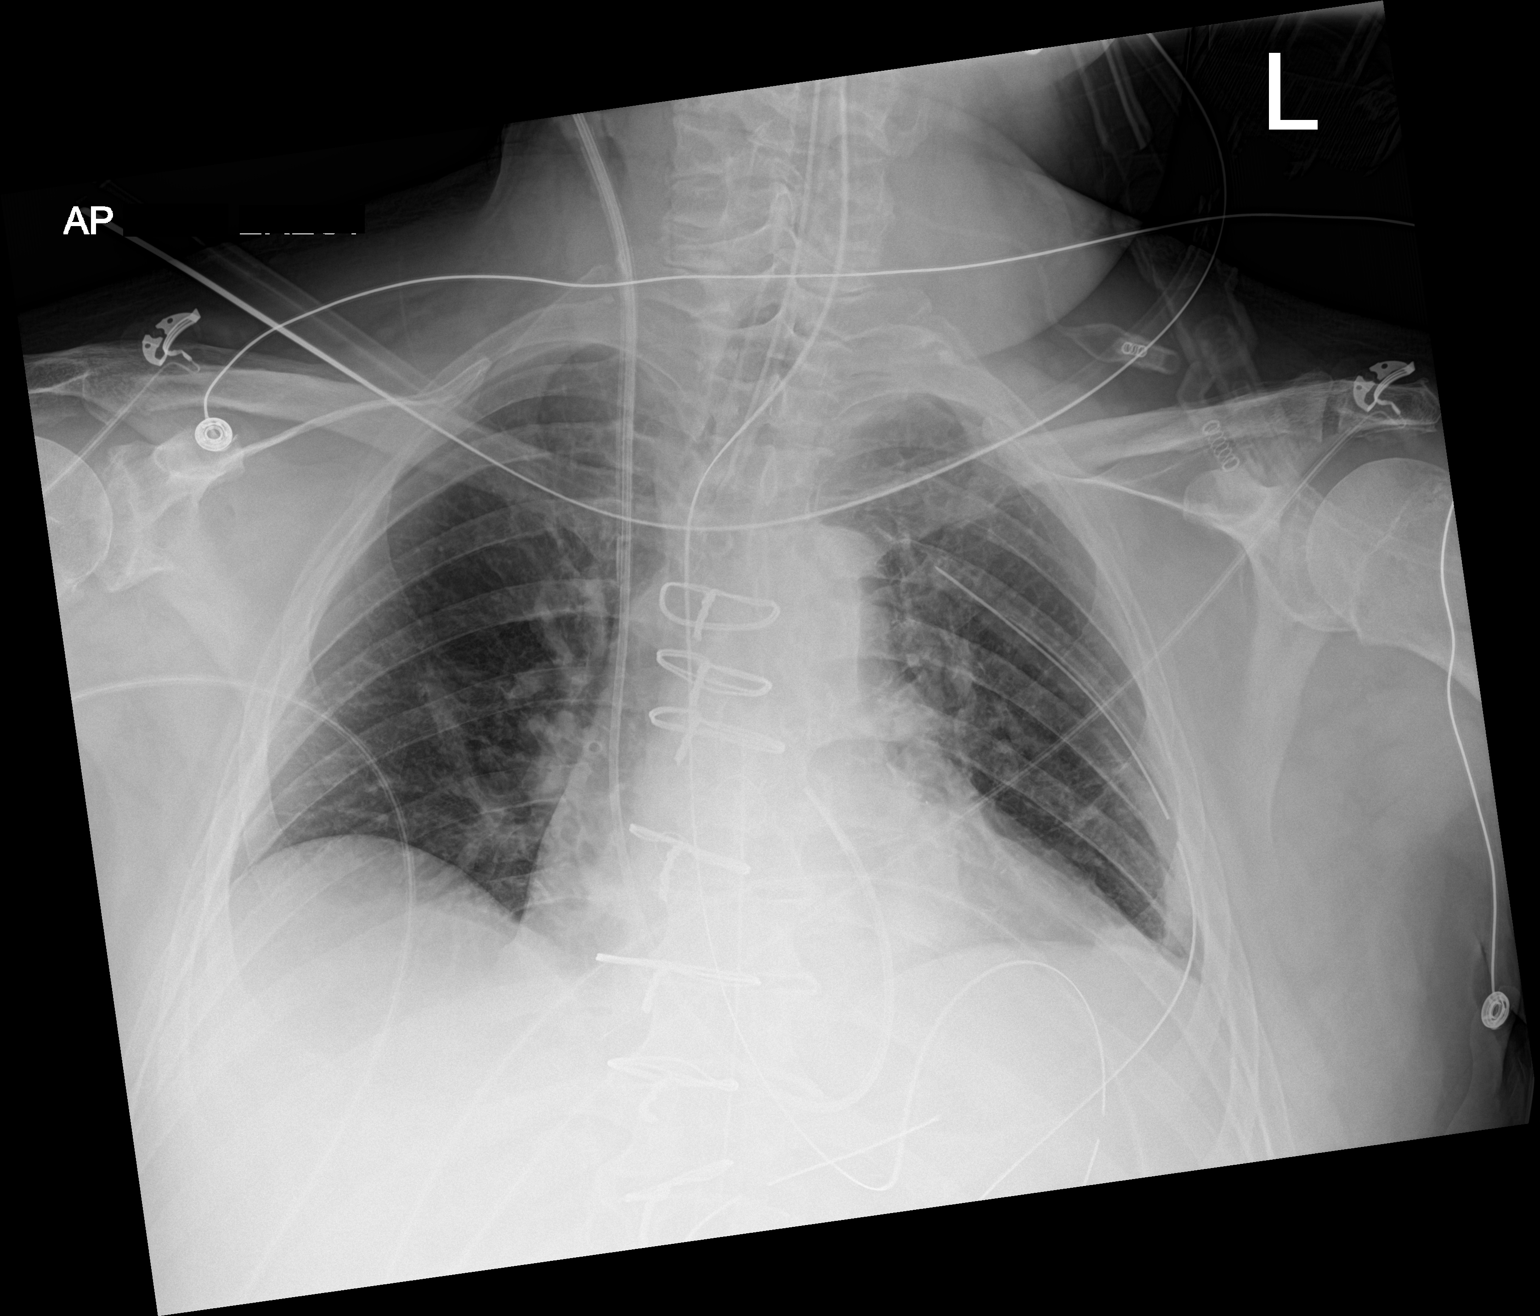

[1 of 1 positions shown; findings below may reference images not displayed]

FINDINGS: Endotracheal tube tip is 4.8 cm above the carina. Nasogastric tube
tip and side port are below the diaphragm; the side port is seen in
the stomach. Swan-Ganz catheter tip is in the main pulmonary outflow
tract. There is a mediastinal drain as well as a left chest tube. No
evident pneumothorax. There is mild left base atelectasis. Lungs
otherwise are clear. Heart is borderline prominent with pulmonary
vascularity normal. Status post coronary artery bypass grafting. No
adenopathy. No bone lesions.
IMPRESSION: Tube and catheter positions as described without pneumothorax.
Slight left base atelectasis. Lungs elsewhere clear. Stable cardiac
silhouette. Status post coronary artery bypass grafting.

## 2020-08-21 IMAGING — DX DG CHEST 1V PORT
1 series · 1 of 1 positions shown · non-contrast
Comparison: 10/19/2019

CLINICAL DATA: Status post CABG.

EXAM:
PORTABLE CHEST 1 VIEW

[chest ap]
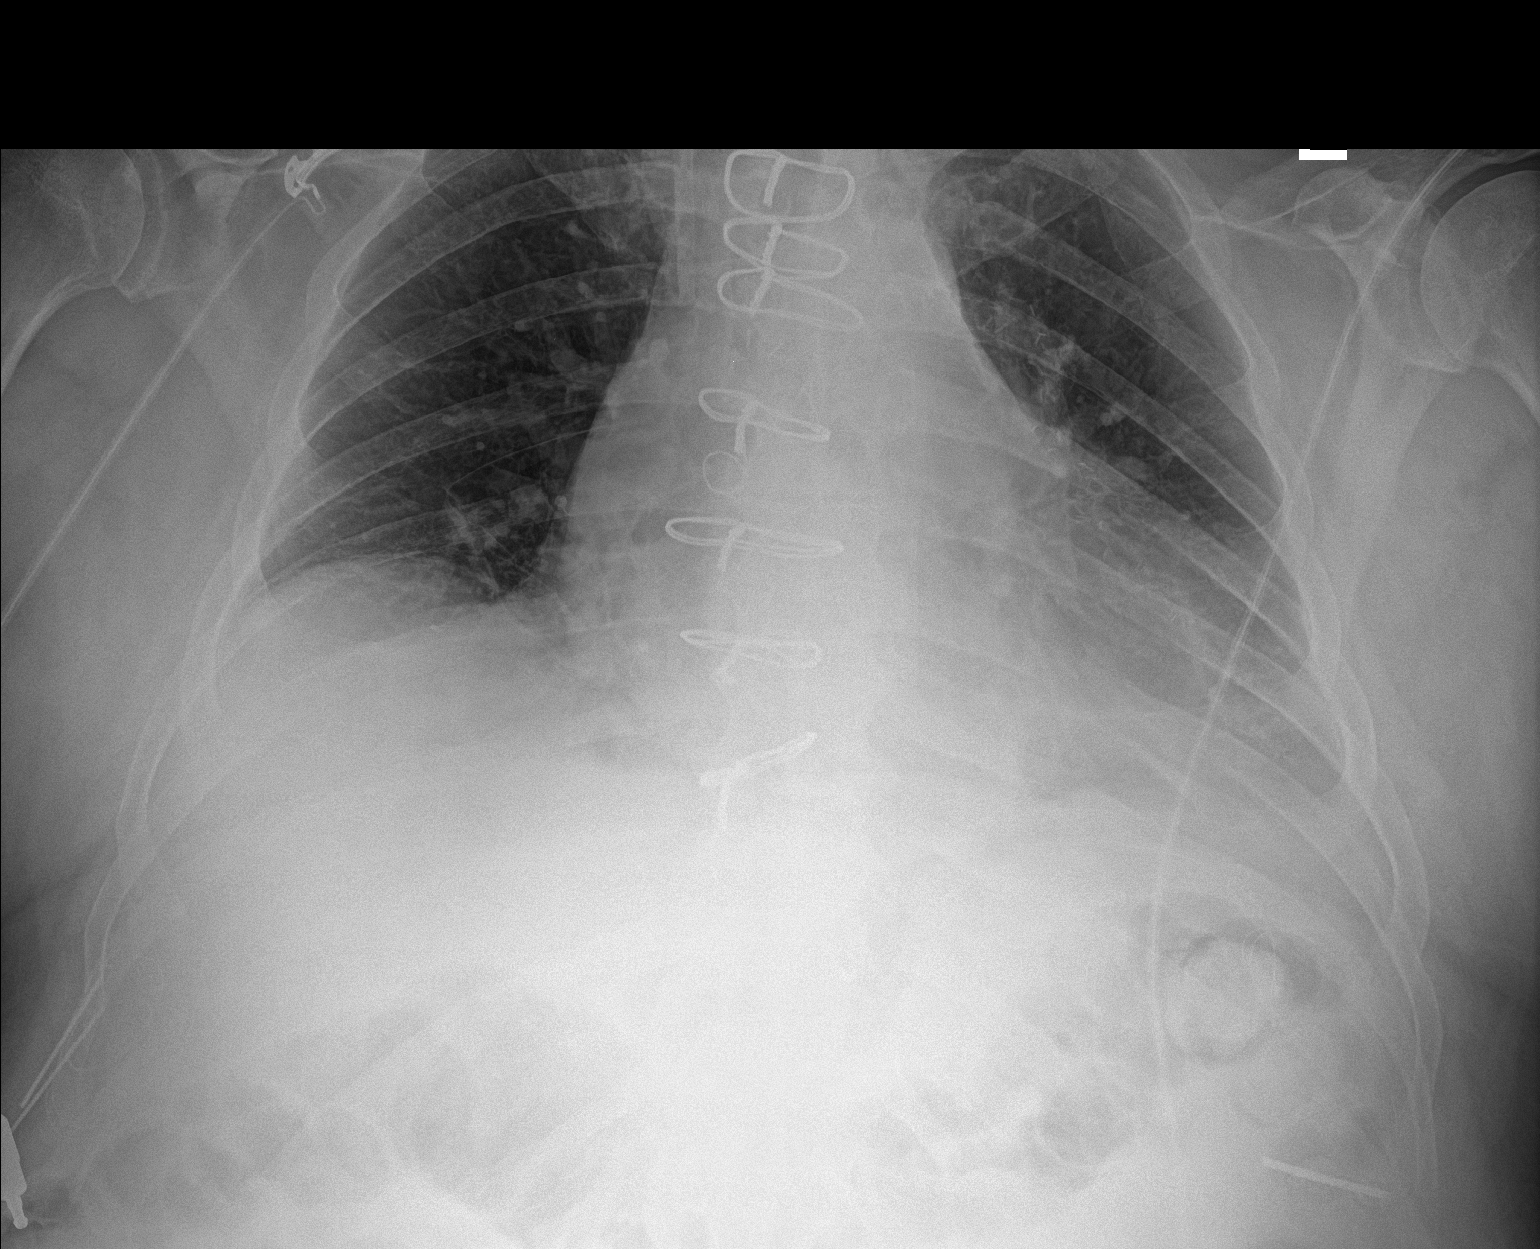

[1 of 1 positions shown; findings below may reference images not displayed]

FINDINGS: Swan-Ganz catheter, left chest tube and mediastinal drains have been
removed. Heart size and mediastinal contours are stable. No
pneumothorax. Stable bibasilar atelectasis and probable small
bilateral pleural effusions. No pulmonary edema.
IMPRESSION: No pneumothorax. Stable bibasilar atelectasis and probable small
bilateral pleural effusions.

## 2020-11-05 DIAGNOSIS — R509 Fever, unspecified: Secondary | ICD-10-CM | POA: Diagnosis not present

## 2020-11-05 DIAGNOSIS — B349 Viral infection, unspecified: Secondary | ICD-10-CM | POA: Diagnosis not present

## 2020-11-05 DIAGNOSIS — Z1152 Encounter for screening for COVID-19: Secondary | ICD-10-CM | POA: Diagnosis not present

## 2020-11-05 DIAGNOSIS — Z20822 Contact with and (suspected) exposure to covid-19: Secondary | ICD-10-CM | POA: Diagnosis not present

## 2020-12-01 DIAGNOSIS — I2699 Other pulmonary embolism without acute cor pulmonale: Secondary | ICD-10-CM | POA: Diagnosis not present

## 2020-12-01 DIAGNOSIS — R972 Elevated prostate specific antigen [PSA]: Secondary | ICD-10-CM | POA: Diagnosis not present

## 2020-12-01 DIAGNOSIS — D682 Hereditary deficiency of other clotting factors: Secondary | ICD-10-CM | POA: Diagnosis not present

## 2020-12-01 DIAGNOSIS — I2510 Atherosclerotic heart disease of native coronary artery without angina pectoris: Secondary | ICD-10-CM | POA: Diagnosis not present

## 2020-12-01 DIAGNOSIS — I10 Essential (primary) hypertension: Secondary | ICD-10-CM | POA: Diagnosis not present

## 2020-12-01 DIAGNOSIS — R7303 Prediabetes: Secondary | ICD-10-CM | POA: Diagnosis not present

## 2020-12-01 DIAGNOSIS — Z951 Presence of aortocoronary bypass graft: Secondary | ICD-10-CM | POA: Diagnosis not present

## 2020-12-01 DIAGNOSIS — E7800 Pure hypercholesterolemia, unspecified: Secondary | ICD-10-CM | POA: Diagnosis not present

## 2021-01-02 DIAGNOSIS — Z23 Encounter for immunization: Secondary | ICD-10-CM | POA: Diagnosis not present

## 2021-01-09 ENCOUNTER — Other Ambulatory Visit: Payer: Self-pay | Admitting: Cardiology

## 2021-02-10 DIAGNOSIS — D485 Neoplasm of uncertain behavior of skin: Secondary | ICD-10-CM | POA: Diagnosis not present

## 2021-02-10 DIAGNOSIS — D235 Other benign neoplasm of skin of trunk: Secondary | ICD-10-CM | POA: Diagnosis not present

## 2021-02-10 DIAGNOSIS — L905 Scar conditions and fibrosis of skin: Secondary | ICD-10-CM | POA: Diagnosis not present

## 2021-02-10 DIAGNOSIS — L738 Other specified follicular disorders: Secondary | ICD-10-CM | POA: Diagnosis not present

## 2021-02-10 DIAGNOSIS — B351 Tinea unguium: Secondary | ICD-10-CM | POA: Diagnosis not present

## 2021-02-10 DIAGNOSIS — Z7189 Other specified counseling: Secondary | ICD-10-CM | POA: Diagnosis not present

## 2021-02-10 DIAGNOSIS — L814 Other melanin hyperpigmentation: Secondary | ICD-10-CM | POA: Diagnosis not present

## 2021-02-10 DIAGNOSIS — L821 Other seborrheic keratosis: Secondary | ICD-10-CM | POA: Diagnosis not present

## 2021-02-19 NOTE — Progress Notes (Signed)
Cardiology Office Note:    Date:  02/22/2021   ID:  Jose Bowers, DOB 1950/11/07, MRN [id number]  PCP:  Wenda Low, MD  Cardiologist:  Donato Heinz, MD  Electrophysiologist:  None   Referring MD: Wenda Low, MD   Chief Complaint  Patient presents with   Coronary Artery Disease     History of Present Illness:    Jose Bowers is a 70 y.o. male with a hx of CAD status post CABG (LIMA-LAD, SVG-OM, SVG sequential to PDA and PLB) factor V Leiden, hypertension, hyperlipidemia, DVT following shoulder surgery in 2014, saddle PE treated with EKOS in 2019 who presents for follow-up.  He was referred by Dr. Lysle Rubens for evaluation of chest pain, initially seen on 09/19/2019.  Exercise Myoview was attempted on 10/04/2019.  He did not reach target heart rate on treadmill and was converted to pharmacologic study.  Despite not reaching target heart rate, he had 1.5 mm horizontal ST depressions in inferior and anterolateral leads and symptoms of chest pain.  MPI imaging showed reversible defect in apical anterior wall and apex consistent with LAD ischemia.  LVEF 49%.  Cardiac catheterization on 10/16/2019 showed severe heavily calcified multivessel disease (99% proximal LAD, 90% proximal circumflex, 80% distal RCA, 90% RPDA).  Underwent CABG 10/18/19 ((LIMA-LAD, SVG-OM, SVG sequential to PDA and PLB).  Echocardiogram 10/16/2019 showed normal biventricular function, no significant valvular disease.  Since last clinic visit, he reports that he has been doing well.  Denies any chest pain, dyspnea, lower extremity edema, or palpitations.  Occasional lightheadedness.  Denies any bleeding issues.   Wt Readings from Last 3 Encounters:  02/20/21 193 lb 6.4 oz (87.7 kg)  08/18/20 197 lb (89.4 kg)  02/18/20 204 lb (92.5 kg)     Past Medical History:  Diagnosis Date   Factor V Leiden ([place])    Hypertension     Past Surgical History:  Procedure Laterality Date   CORONARY ARTERY BYPASS GRAFT  N/A 10/18/2019   Procedure: CORONARY ARTERY BYPASS GRAFTING (CABG), ON PUMP, TIMES FOUR, USING LEFT INTERAL MAMMARY ATERY AND ENDOSCOPICALLY HARVESTED RIGHT GREATER SAPHENOUS VEIN;  Surgeon: Gaye Pollack, MD;  Location: [facility];  Service: Open Heart Surgery;  Laterality: N/A;   HERNIA REPAIR     IR ANGIOGRAM PULMONARY BILATERAL SELECTIVE  03/19/2017   IR ANGIOGRAM SELECTIVE EACH ADDITIONAL VESSEL  03/19/2017   IR ANGIOGRAM SELECTIVE EACH ADDITIONAL VESSEL  03/19/2017   IR INFUSION THROMBOL ARTERIAL INITIAL (MS)  03/19/2017   IR INFUSION THROMBOL ARTERIAL INITIAL (MS)  03/19/2017   IR THROMB F/U EVAL ART/VEN FINAL DAY (MS)  03/20/2017   IR US GUIDE VASC ACCESS RIGHT  03/19/2017   JOINT REPLACEMENT     LEFT HEART CATH AND CORONARY ANGIOGRAPHY N/A 10/16/2019   Procedure: LEFT HEART CATH AND CORONARY ANGIOGRAPHY;  Surgeon: Leonie Man, MD;  Location: [facility];  Service: Cardiovascular;  Laterality: N/A;   ROTATOR CUFF REPAIR     TEE WITHOUT CARDIOVERSION N/A 10/18/2019   Procedure: TRANSESOPHAGEAL ECHOCARDIOGRAM (TEE);  Surgeon: Gaye Pollack, MD;  Location: [facility];  Service: Open Heart Surgery;  Laterality: N/A;    Current Medications: Current Meds  Medication Sig   aspirin EC 81 MG EC tablet Take 1 tablet (81 mg total) by mouth daily. Swallow whole.   atorvastatin (LIPITOR) 80 MG tablet TAKE 1 TABLET BY MOUTH DAILY AT 6 PM.   fluocinonide cream (LIDEX) 2.95 % Apply 1 application topically daily as needed (itching/rash ([place]ver's  disease)).   losartan (COZAAR) 50 MG tablet Take 1 tablet (50 mg total) by mouth daily.   metoprolol succinate (TOPROL XL) 25 MG 24 hr tablet Take 1 tablet (25 mg total) by mouth daily.   XARELTO 20 MG TABS tablet Take 20 mg by mouth daily.   [DISCONTINUED] losartan-hydrochlorothiazide (HYZAAR) 50-12.5 MG tablet Take 1 tablet by mouth daily.     Allergies:   Patient has no known allergies.   Social History   Socioeconomic History   Marital status: Married     Spouse name: Not on file   Number of children: Not on file   Years of education: Not on file   Highest education level: Not on file  Occupational History   Occupation: retired     Comment: Dietitian   Tobacco Use   Smoking status: Never   Smokeless tobacco: Never  Vaping Use   Vaping Use: Never used  Substance and Sexual Activity   Alcohol use: Yes    Comment: "few times a week"   Drug use: No   Sexual activity: Not on file  Other Topics Concern   Not on file  Social History Narrative   Not on file   Social Determinants of Health   Financial Resource Strain: Not on file  Food Insecurity: Not on file  Transportation Needs: Not on file  Physical Activity: Not on file  Stress: Not on file  Social Connections: Not on file     Family History: The patient's family history includes Clotting disorder in his brother, father, grandchild, and son.  ROS:   Please see the history of present illness.    (+) Shortness of breath (+) Pruritis along incision All other systems reviewed and are negative.  EKGs/Labs/Other Studies Reviewed:    The following studies were reviewed today:  Intraoperative TEE 10/18/2019: - Left Ventricle: The left ventricle is essentially unchanged from  pre-bypass.  - Right Ventricle: The right ventricle appears unchanged from pre-bypass.  - Aorta: The aorta appears unchanged from pre-bypass.  - Left Atrium: The left atrium appears unchanged from pre-bypass.  - Left Atrial Appendage: The left atrial appendage appears unchanged from  pre-bypass.  - Aortic Valve: The aortic valve appears unchanged from pre-bypass.  - Mitral Valve: The mitral valve appears essentially unchanged from  pre-bypass.  - Tricuspid Valve: The tricuspid valve appears essentially unchanged from  pre-bypass.  - Interatrial Septum: The interatrial septum appears unchanged from  pre-bypass.  - Interventricular Septum: The interventricular septum appears unchanged  from   pre-bypass.  - Pericardium: The pericardium appears unchanged from pre-bypass.   US Doppler Pre CABG 10/17/2019: Right Carotid: Velocities in the right ICA are consistent with a 1-39%  stenosis.  Left Carotid: Velocities in the left ICA are consistent with a 1-39%  stenosis.  Vertebrals:  Bilateral vertebral arteries demonstrate antegrade flow.  Subclavians: Normal flow hemodynamics were seen in bilateral subclavian arteries.  Right Upper Extremity: Doppler waveforms remain within normal limits with right radial compression. Doppler waveforms remain within normal limits with right ulnar compression.  Left Upper Extremity: Doppler waveforms remain within normal limits with left radial compression. Doppler waveforms remain within normal limits with left ulnar compression.   Echo 10/16/2019:  1. Left ventricular ejection fraction, by estimation, is 55 to 60%. The  left ventricle has normal function. The left ventricle has no regional  wall motion abnormalities. There is mild left ventricular hypertrophy of  the basal-septal segment. Left  ventricular diastolic parameters are consistent  with Grade I diastolic  dysfunction (impaired relaxation).   2. Right ventricular systolic function is normal. The right ventricular  size is normal. Tricuspid regurgitation signal is inadequate for assessing  PA pressure.   3. Left atrial size was mildly dilated.   4. The mitral valve is normal in structure. No evidence of mitral valve  regurgitation. No evidence of mitral stenosis.   5. The aortic valve is normal in structure. Aortic valve regurgitation is  not visualized. No aortic stenosis is present.   6. Aortic dilatation noted. There is borderline dilatation of the aortic  root measuring 38 mm.   7. The inferior vena cava is normal in size with greater than 50%  respiratory variability, suggesting right atrial pressure of 3 mmHg.   Comparison(s): No significant change from prior study. Prior images   reviewed side by side.   LHC 10/16/2019: -- LEFT VENTRICULAR HEMODYNAMICS & VENTRICULOGRAPHY There is mild left ventricular systolic dysfunction. LV end diastolic pressure is normal. The left ventricular ejection fraction is 45-50% by visual estimate. -- CORONARY ANGIOGRAPHY Prox LAD to Mid LAD lesion is 99% stenosed with 50% stenosed side branch in 1st Diag. Prox Cx lesion is 90% stenosed. Dist RCA lesion is 80% stenosed with 90% stenosed side branch in RPDA. SUMMARY  Severe heavily calcified multivessel disease: Heavily calcified proximal LAD subtotal occlusion (99%) lesion beginning just prior to major SP trunk, terminating after 1st Diag --> remainder the LAD is relatively free of disease with TIMI I flow and competitive flow from right left collaterals to the apex Heavily calcified very focal 90% eccentric stenosis in the proximal LCx--LCx gives off 2 major Mrg branches and a posterior AV groove branch with 2 small PL branches--free of disease. Heavily calcified dominant RCA with diffuse 30% mid to distal disease, and 80% focal stenosis just prior to RPAV continuing into the RPDA with 90% ostial PDA lesion. Mildly reduced LVEF of roughly 45% with mid to apical anterior hypokinesis RECOMMENDATION Admit under Dr. Newman Nickels service for medication optimization and CVTS consultation He will be started on IV Heparin while we continue to hold his Xarelto. Increase to Max dose Atorvastatin & adjust BP medications    Lexiscan Stress Test 10/04/2019: Nuclear stress EF: 49%. The left ventricular ejection fraction is mildly decreased (45-54%). Horizontal ST segment depression ST segment depression of 1.5 mm was noted during stress in the II, III, aVF, V5 and V6 leads, beginning at 7 minutes of stress, and returning to baseline after less than 1 minute of recovery. Findings consistent with ischemia. This is a high risk study.   1. Initial ECG treadmill stress did not reach target HR and was  converted to pharmacological study. Despite not reaching target HR, the patient had 1.5 mm horizontal ST depression in inferior leads (II, III, AVF) and anterolateral leads (V5-6) and symptoms of chest pain. 2. On MPI imaging, there is a medium size (10-12% of the LV), moderate perfusion defect present on stress imaging in the apical anterior, and apex consistent with ischemia in the LAD distrubution.  3. LVEF is normal for this modality, 49%.  4. Average exercise capacity (8:11 min:s; 7.0 METS). 5. Overall, this is a high risk study concerning for ischemia.  EKG:   02/20/21: NSR, rate 81, no ST abnormalities 08/18/2020: NSR, rate 57, No ST abnormalities 11/16/2019: normal sinus rhythm, rate 63, no ST abnormalities 10/09/2019: NSR, rate 72, no ST abnormalities  Recent Labs: No results found for requested labs within last 8760 hours.  Recent Lipid Panel    Component Value Date/Time   CHOL 114 01/18/2020 0845   TRIG 74 01/18/2020 0845   HDL 48 01/18/2020 0845   CHOLHDL 2.4 01/18/2020 0845   CHOLHDL 3.6 10/18/2019 0511   VLDL 24 10/18/2019 0511   LDLCALC 51 01/18/2020 0845    Physical Exam:    VS:  BP 110/66   Pulse 81   Ht 5\' 10"  (1.778 m)   Wt 193 lb 6.4 oz (87.7 kg)   SpO2 98%   BMI 27.75 kg/m     Wt Readings from Last 3 Encounters:  02/20/21 193 lb 6.4 oz (87.7 kg)  08/18/20 197 lb (89.4 kg)  02/18/20 204 lb (92.5 kg)     GEN: in no acute distress HEENT: Normal NECK: No JVD; No carotid bruits CARDIAC: RRR, no murmurs, rubs, gallops RESPIRATORY:  Clear to auscultation without rales, wheezing or rhonchi  ABDOMEN: Soft, non-tender, non-distended MUSCULOSKELETAL:  No edema SKIN: Warm and dry NEUROLOGIC:  Alert and oriented x 3 PSYCHIATRIC:  Normal affect   ASSESSMENT:    1. CAD in native artery   2. Essential hypertension   3. Hyperlipidemia, unspecified hyperlipidemia type     PLAN:    CAD: Cardiac catheterization on 10/16/2019 showed severe heavily calcified  multivessel disease (99% proximal LAD, 90% proximal circumflex, 80% distal RCA, 90% RPDA).  Underwent CABG 10/18/19 (LIMA-LAD, SVG-OM, SVG sequential to PDA and PLB).  Echocardiogram on 10/16/2019 showed normal biventricular function, no significant valvular disease. -Will discontinue aspirin and continue Xarelto monotherapy -Continue atorvastatin 80 mg daily -Continue Toprol-XL 25 mg daily  Hypertension: On losartan-hydrochlorothiazide 50-12.5 mg daily and Toprol-XL 25 mg daily.  He reports some soft pressures and has been having intermittent lightheadedness.  Will discontinue HCTZ, continue losartan 50 mg daily and Toprol-XL 25 mg daily  Hyperlipidemia: On atorvastatin 80 mg daily, LDL 54 on 05/30/2020  Factor V Leiden: History of DVT and PE.  On Xarelto    RTC in 6 months   Medication Adjustments/Labs and Tests Ordered: Current medicines are reviewed at length with the patient today.  Concerns regarding medicines are outlined above.  Orders Placed This Encounter  Procedures   Basic metabolic panel   EKG [id number]    Meds ordered this encounter  Medications   losartan (COZAAR) 50 MG tablet    Sig: Take 1 tablet (50 mg total) by mouth daily.    Dispense:  90 tablet    Refill:  3     Patient Instructions  Medication Instructions:  STOP losartan-hctz START losartan 50mg  daily  *If you need a refill on your cardiac medications before your next appointment, please call your pharmacy*   Lab Work: NON-FASTING BMET in 1 week  If you have labs (blood work) drawn today and your tests are completely normal, you will receive your results only by: [place] (if you have MyChart) OR A paper copy in the mail If you have any lab test that is abnormal or we need to change your treatment, we will call you to review the results.   Testing/Procedures: NONE   Follow-Up: At [facility], you and your health needs are our priority.  As part of our continuing mission to provide you  with exceptional heart care, we have created designated Provider Care Teams.  These Care Teams include your primary Cardiologist (physician) and Advanced Practice Providers (APPs -  Physician Assistants and Nurse Practitioners) who all work together to provide you with the care you need, when  you need it.  We recommend signing up for the patient portal called "MyChart".  Sign up information is provided on this After Visit Summary.  MyChart is used to connect with patients for Virtual Visits (Telemedicine).  Patients are able to view lab/test results, encounter notes, upcoming appointments, etc.  Non-urgent messages can be sent to your provider as well.   To learn more about what you can do with MyChart, go to [email or web address].    Your next appointment:   6 month(s)  The format for your next appointment:   In Person  Provider:   Donato Heinz, MD {     Signed, Donato Heinz, MD  02/22/2021 9:53 PM    [place]

## 2021-02-20 ENCOUNTER — Ambulatory Visit (INDEPENDENT_AMBULATORY_CARE_PROVIDER_SITE_OTHER): Payer: Medicare Other | Admitting: Cardiology

## 2021-02-20 ENCOUNTER — Other Ambulatory Visit: Payer: Self-pay

## 2021-02-20 ENCOUNTER — Encounter: Payer: Self-pay | Admitting: Cardiology

## 2021-02-20 VITALS — BP 110/66 | HR 81 | Ht 70.0 in | Wt 193.4 lb

## 2021-02-20 DIAGNOSIS — I2510 Atherosclerotic heart disease of native coronary artery without angina pectoris: Secondary | ICD-10-CM | POA: Diagnosis not present

## 2021-02-20 DIAGNOSIS — E785 Hyperlipidemia, unspecified: Secondary | ICD-10-CM | POA: Diagnosis not present

## 2021-02-20 DIAGNOSIS — I10 Essential (primary) hypertension: Secondary | ICD-10-CM

## 2021-02-20 MED ORDER — LOSARTAN POTASSIUM 50 MG PO TABS: 50.0000 mg | ORAL_TABLET | Freq: Every day | ORAL | 3 refills | Status: DC

## 2021-02-20 NOTE — Patient Instructions (Signed)
Medication Instructions:  STOP losartan-hctz START losartan 50mg  daily  *If you need a refill on your cardiac medications before your next appointment, please call your pharmacy*   Lab Work: NON-FASTING BMET in 1 week  If you have labs (blood work) drawn today and your tests are completely normal, you will receive your results only by: [place] (if you have MyChart) OR A paper copy in the mail If you have any lab test that is abnormal or we need to change your treatment, we will call you to review the results.   Testing/Procedures: NONE   Follow-Up: At [facility], you and your health needs are our priority.  As part of our continuing mission to provide you with exceptional heart care, we have created designated Provider Care Teams.  These Care Teams include your primary Cardiologist (physician) and Advanced Practice Providers (APPs -  Physician Assistants and Nurse Practitioners) who all work together to provide you with the care you need, when you need it.  We recommend signing up for the patient portal called "MyChart".  Sign up information is provided on this After Visit Summary.  MyChart is used to connect with patients for Virtual Visits (Telemedicine).  Patients are able to view lab/test results, encounter notes, upcoming appointments, etc.  Non-urgent messages can be sent to your provider as well.   To learn more about what you can do with MyChart, go to [email or web address].    Your next appointment:   6 month(s)  The format for your next appointment:   In Person  Provider:   Donato Heinz, MD {

## 2021-03-03 DIAGNOSIS — I2510 Atherosclerotic heart disease of native coronary artery without angina pectoris: Secondary | ICD-10-CM | POA: Diagnosis not present

## 2021-03-04 LAB — BASIC METABOLIC PANEL
BUN/Creatinine Ratio: 18 (ref 10–24)
BUN: 16 mg/dL (ref 8–27)
CO2: 26 mmol/L (ref 20–29)
Calcium: 9.1 mg/dL (ref 8.6–10.2)
Chloride: 104 mmol/L (ref 96–106)
Creatinine, Ser: 0.87 mg/dL (ref 0.76–1.27)
Glucose: 94 mg/dL (ref 70–99)
Potassium: 4.8 mmol/L (ref 3.5–5.2)
Sodium: 143 mmol/L (ref 134–144)
eGFR: 94 mL/min/{1.73_m2} (ref >59)

## 2021-06-02 DIAGNOSIS — Z1389 Encounter for screening for other disorder: Secondary | ICD-10-CM | POA: Diagnosis not present

## 2021-06-02 DIAGNOSIS — Z0000 Encounter for general adult medical examination without abnormal findings: Secondary | ICD-10-CM | POA: Diagnosis not present

## 2021-06-02 DIAGNOSIS — E7800 Pure hypercholesterolemia, unspecified: Secondary | ICD-10-CM | POA: Diagnosis not present

## 2021-06-02 DIAGNOSIS — Z23 Encounter for immunization: Secondary | ICD-10-CM | POA: Diagnosis not present

## 2021-06-02 DIAGNOSIS — Z86711 Personal history of pulmonary embolism: Secondary | ICD-10-CM | POA: Diagnosis not present

## 2021-06-02 DIAGNOSIS — M519 Unspecified thoracic, thoracolumbar and lumbosacral intervertebral disc disorder: Secondary | ICD-10-CM | POA: Diagnosis not present

## 2021-06-02 DIAGNOSIS — R972 Elevated prostate specific antigen [PSA]: Secondary | ICD-10-CM | POA: Diagnosis not present

## 2021-06-02 DIAGNOSIS — D682 Hereditary deficiency of other clotting factors: Secondary | ICD-10-CM | POA: Diagnosis not present

## 2021-06-02 DIAGNOSIS — R7303 Prediabetes: Secondary | ICD-10-CM | POA: Diagnosis not present

## 2021-06-02 DIAGNOSIS — I10 Essential (primary) hypertension: Secondary | ICD-10-CM | POA: Diagnosis not present

## 2021-06-02 DIAGNOSIS — Z951 Presence of aortocoronary bypass graft: Secondary | ICD-10-CM | POA: Diagnosis not present

## 2021-07-28 DIAGNOSIS — H2513 Age-related nuclear cataract, bilateral: Secondary | ICD-10-CM | POA: Diagnosis not present

## 2021-08-10 ENCOUNTER — Other Ambulatory Visit: Payer: Self-pay | Admitting: Cardiology

## 2021-08-10 NOTE — Progress Notes (Unsigned)
Cardiology Office Note:    Date:  08/11/2021   ID:  Jose Bowers, DOB 04-28-1950, MRN [id number]  PCP:  Wenda Low, MD  Cardiologist:  Donato Heinz, MD  Electrophysiologist:  None   Referring MD: Wenda Low, MD   No chief complaint on file.    History of Present Illness:    Jose Bowers is a 71 y.o. male with a hx of CAD status post CABG (LIMA-LAD, SVG-OM, SVG sequential to PDA and PLB) factor V Leiden, hypertension, hyperlipidemia, DVT following shoulder surgery in 2014, saddle PE treated with EKOS in 2019 who presents for follow-up.  He was referred by Dr. Lysle Rubens for evaluation of chest pain, initially seen on 09/19/2019.  Exercise Myoview was attempted on 10/04/2019.  He did not reach target heart rate on treadmill and was converted to pharmacologic study.  Despite not reaching target heart rate, he had 1.5 mm horizontal ST depressions in inferior and anterolateral leads and symptoms of chest pain.  MPI imaging showed reversible defect in apical anterior wall and apex consistent with LAD ischemia.  LVEF 49%.  Cardiac catheterization on 10/16/2019 showed severe heavily calcified multivessel disease (99% proximal LAD, 90% proximal circumflex, 80% distal RCA, 90% RPDA).  Underwent CABG 10/18/19 ((LIMA-LAD, SVG-OM, SVG sequential to PDA and PLB).  Echocardiogram 10/16/2019 showed normal biventricular function, no significant valvular disease.  Since last clinic visit, he reports that he has been doing well.  Denies any chest pain, dyspnea, lightheadedness, syncope, lower extremity edema, or palpitations.  Reports BP 120s to 130s at home.  Denies any bleeding issues.  Has been walking regularly, denies any exertional symptoms   Wt Readings from Last 3 Encounters:  08/11/21 192 lb 9.6 oz (87.4 kg)  02/20/21 193 lb 6.4 oz (87.7 kg)  08/18/20 197 lb (89.4 kg)     Past Medical History:  Diagnosis Date   Factor V Leiden ([place])    Hypertension     Past Surgical History:   Procedure Laterality Date   CORONARY ARTERY BYPASS GRAFT N/A 10/18/2019   Procedure: CORONARY ARTERY BYPASS GRAFTING (CABG), ON PUMP, TIMES FOUR, USING LEFT INTERAL MAMMARY ATERY AND ENDOSCOPICALLY HARVESTED RIGHT GREATER SAPHENOUS VEIN;  Surgeon: Gaye Pollack, MD;  Location: [facility];  Service: Open Heart Surgery;  Laterality: N/A;   HERNIA REPAIR     IR ANGIOGRAM PULMONARY BILATERAL SELECTIVE  03/19/2017   IR ANGIOGRAM SELECTIVE EACH ADDITIONAL VESSEL  03/19/2017   IR ANGIOGRAM SELECTIVE EACH ADDITIONAL VESSEL  03/19/2017   IR INFUSION THROMBOL ARTERIAL INITIAL (MS)  03/19/2017   IR INFUSION THROMBOL ARTERIAL INITIAL (MS)  03/19/2017   IR THROMB F/U EVAL ART/VEN FINAL DAY (MS)  03/20/2017   IR US GUIDE VASC ACCESS RIGHT  03/19/2017   JOINT REPLACEMENT     LEFT HEART CATH AND CORONARY ANGIOGRAPHY N/A 10/16/2019   Procedure: LEFT HEART CATH AND CORONARY ANGIOGRAPHY;  Surgeon: Leonie Man, MD;  Location: [facility];  Service: Cardiovascular;  Laterality: N/A;   ROTATOR CUFF REPAIR     TEE WITHOUT CARDIOVERSION N/A 10/18/2019   Procedure: TRANSESOPHAGEAL ECHOCARDIOGRAM (TEE);  Surgeon: Gaye Pollack, MD;  Location: [facility];  Service: Open Heart Surgery;  Laterality: N/A;    Current Medications: Current Meds  Medication Sig   atorvastatin (LIPITOR) 80 MG tablet TAKE 1 TABLET BY MOUTH DAILY AT 6 PM.   metoprolol succinate (TOPROL XL) 25 MG 24 hr tablet Take 1 tablet (25 mg total) by mouth daily.   XARELTO 20  MG TABS tablet Take 20 mg by mouth daily.     Allergies:   Patient has no known allergies.   Social History   Socioeconomic History   Marital status: Married    Spouse name: Not on file   Number of children: Not on file   Years of education: Not on file   Highest education level: Not on file  Occupational History   Occupation: retired     Comment: Dietitian   Tobacco Use   Smoking status: Never   Smokeless tobacco: Never  Vaping Use   Vaping Use: Never used  Substance  and Sexual Activity   Alcohol use: Yes    Comment: "few times a week"   Drug use: No   Sexual activity: Not on file  Other Topics Concern   Not on file  Social History Narrative   Not on file   Social Determinants of Health   Financial Resource Strain: Not on file  Food Insecurity: Not on file  Transportation Needs: Not on file  Physical Activity: Not on file  Stress: Not on file  Social Connections: Not on file     Family History: The patient's family history includes Clotting disorder in his brother, father, grandchild, and son.  ROS:   Please see the history of present illness.     All other systems reviewed and are negative.  EKGs/Labs/Other Studies Reviewed:    The following studies were reviewed today:  Intraoperative TEE 10/18/2019: - Left Ventricle: The left ventricle is essentially unchanged from  pre-bypass.  - Right Ventricle: The right ventricle appears unchanged from pre-bypass.  - Aorta: The aorta appears unchanged from pre-bypass.  - Left Atrium: The left atrium appears unchanged from pre-bypass.  - Left Atrial Appendage: The left atrial appendage appears unchanged from  pre-bypass.  - Aortic Valve: The aortic valve appears unchanged from pre-bypass.  - Mitral Valve: The mitral valve appears essentially unchanged from  pre-bypass.  - Tricuspid Valve: The tricuspid valve appears essentially unchanged from  pre-bypass.  - Interatrial Septum: The interatrial septum appears unchanged from  pre-bypass.  - Interventricular Septum: The interventricular septum appears unchanged  from  pre-bypass.  - Pericardium: The pericardium appears unchanged from pre-bypass.   US Doppler Pre CABG 10/17/2019: Right Carotid: Velocities in the right ICA are consistent with a 1-39%  stenosis.  Left Carotid: Velocities in the left ICA are consistent with a 1-39%  stenosis.  Vertebrals:  Bilateral vertebral arteries demonstrate antegrade flow.  Subclavians: Normal flow  hemodynamics were seen in bilateral subclavian arteries.  Right Upper Extremity: Doppler waveforms remain within normal limits with right radial compression. Doppler waveforms remain within normal limits with right ulnar compression.  Left Upper Extremity: Doppler waveforms remain within normal limits with left radial compression. Doppler waveforms remain within normal limits with left ulnar compression.   Echo 10/16/2019:  1. Left ventricular ejection fraction, by estimation, is 55 to 60%. The  left ventricle has normal function. The left ventricle has no regional  wall motion abnormalities. There is mild left ventricular hypertrophy of  the basal-septal segment. Left  ventricular diastolic parameters are consistent with Grade I diastolic  dysfunction (impaired relaxation).   2. Right ventricular systolic function is normal. The right ventricular  size is normal. Tricuspid regurgitation signal is inadequate for assessing  PA pressure.   3. Left atrial size was mildly dilated.   4. The mitral valve is normal in structure. No evidence of mitral valve  regurgitation. No evidence  of mitral stenosis.   5. The aortic valve is normal in structure. Aortic valve regurgitation is  not visualized. No aortic stenosis is present.   6. Aortic dilatation noted. There is borderline dilatation of the aortic  root measuring 38 mm.   7. The inferior vena cava is normal in size with greater than 50%  respiratory variability, suggesting right atrial pressure of 3 mmHg.   Comparison(s): No significant change from prior study. Prior images  reviewed side by side.   LHC 10/16/2019: -- LEFT VENTRICULAR HEMODYNAMICS & VENTRICULOGRAPHY There is mild left ventricular systolic dysfunction. LV end diastolic pressure is normal. The left ventricular ejection fraction is 45-50% by visual estimate. -- CORONARY ANGIOGRAPHY Prox LAD to Mid LAD lesion is 99% stenosed with 50% stenosed side branch in 1st Diag. Prox Cx  lesion is 90% stenosed. Dist RCA lesion is 80% stenosed with 90% stenosed side branch in RPDA. SUMMARY  Severe heavily calcified multivessel disease: Heavily calcified proximal LAD subtotal occlusion (99%) lesion beginning just prior to major SP trunk, terminating after 1st Diag --> remainder the LAD is relatively free of disease with TIMI I flow and competitive flow from right left collaterals to the apex Heavily calcified very focal 90% eccentric stenosis in the proximal LCx--LCx gives off 2 major Mrg branches and a posterior AV groove branch with 2 small PL branches--free of disease. Heavily calcified dominant RCA with diffuse 30% mid to distal disease, and 80% focal stenosis just prior to RPAV continuing into the RPDA with 90% ostial PDA lesion. Mildly reduced LVEF of roughly 45% with mid to apical anterior hypokinesis RECOMMENDATION Admit under Dr. Newman Nickels service for medication optimization and CVTS consultation He will be started on IV Heparin while we continue to hold his Xarelto. Increase to Max dose Atorvastatin & adjust BP medications    Lexiscan Stress Test 10/04/2019: Nuclear stress EF: 49%. The left ventricular ejection fraction is mildly decreased (45-54%). Horizontal ST segment depression ST segment depression of 1.5 mm was noted during stress in the II, III, aVF, V5 and V6 leads, beginning at 7 minutes of stress, and returning to baseline after less than 1 minute of recovery. Findings consistent with ischemia. This is a high risk study.   1. Initial ECG treadmill stress did not reach target HR and was converted to pharmacological study. Despite not reaching target HR, the patient had 1.5 mm horizontal ST depression in inferior leads (II, III, AVF) and anterolateral leads (V5-6) and symptoms of chest pain. 2. On MPI imaging, there is a medium size (10-12% of the LV), moderate perfusion defect present on stress imaging in the apical anterior, and apex consistent with ischemia  in the LAD distrubution.  3. LVEF is normal for this modality, 49%.  4. Average exercise capacity (8:11 min:s; 7.0 METS). 5. Overall, this is a high risk study concerning for ischemia.  EKG:   02/20/21: NSR, rate 81, no ST abnormalities 08/18/2020: NSR, rate 57, No ST abnormalities 11/16/2019: normal sinus rhythm, rate 63, no ST abnormalities 10/09/2019: NSR, rate 72, no ST abnormalities  Recent Labs: 03/03/2021: BUN 16; Creatinine, Ser 0.87; Potassium 4.8; Sodium 143  Recent Lipid Panel    Component Value Date/Time   CHOL 114 01/18/2020 0845   TRIG 74 01/18/2020 0845   HDL 48 01/18/2020 0845   CHOLHDL 2.4 01/18/2020 0845   CHOLHDL 3.6 10/18/2019 0511   VLDL 24 10/18/2019 0511   LDLCALC 51 01/18/2020 0845    Physical Exam:    VS:  BP  123/72   Pulse 64   Ht '5\' 10"'$  (1.778 m)   Wt 192 lb 9.6 oz (87.4 kg)   SpO2 93%   BMI 27.64 kg/m     Wt Readings from Last 3 Encounters:  08/11/21 192 lb 9.6 oz (87.4 kg)  02/20/21 193 lb 6.4 oz (87.7 kg)  08/18/20 197 lb (89.4 kg)     GEN: in no acute distress HEENT: Normal NECK: No JVD; No carotid bruits CARDIAC: RRR, no murmurs, rubs, gallops RESPIRATORY:  Clear to auscultation without rales, wheezing or rhonchi  ABDOMEN: Soft, non-tender, non-distended MUSCULOSKELETAL:  No edema SKIN: Warm and dry NEUROLOGIC:  Alert and oriented x 3 PSYCHIATRIC:  Normal affect   ASSESSMENT:    1. CAD in native artery   2. S/P CABG (coronary artery bypass graft)   3. Essential hypertension   4. Hyperlipidemia, unspecified hyperlipidemia type      PLAN:    CAD: Cardiac catheterization on 10/16/2019 showed severe heavily calcified multivessel disease (99% proximal LAD, 90% proximal circumflex, 80% distal RCA, 90% RPDA).  Underwent CABG 10/18/19 (LIMA-LAD, SVG-OM, SVG sequential to PDA and PLB).  Echocardiogram on 10/16/2019 showed normal biventricular function, no significant valvular disease. -Continue Xarelto  -Continue atorvastatin 80 mg  daily -Continue Toprol-XL 25 mg daily  Hypertension: continue losartan 50 mg daily and Toprol-XL 25 mg daily.  Appears controlled.  Hyperlipidemia: On atorvastatin 80 mg daily, LDL 61 on 06/02/21  Factor V Leiden: History of DVT and PE.  On Xarelto    RTC in 6 months   Medication Adjustments/Labs and Tests Ordered: Current medicines are reviewed at length with the patient today.  Concerns regarding medicines are outlined above.  No orders of the defined types were placed in this encounter.   No orders of the defined types were placed in this encounter.    Patient Instructions  Medication Instructions:  Your physician recommends that you continue on your current medications as directed. Please refer to the Current Medication list given to you today.  *If you need a refill on your cardiac medications before your next appointment, please call your pharmacy*  Follow-Up: At [facility], you and your health needs are our priority.  As part of our continuing mission to provide you with exceptional heart care, we have created designated Provider Care Teams.  These Care Teams include your primary Cardiologist (physician) and Advanced Practice Providers (APPs -  Physician Assistants and Nurse Practitioners) who all work together to provide you with the care you need, when you need it.  We recommend signing up for the patient portal called "MyChart".  Sign up information is provided on this After Visit Summary.  MyChart is used to connect with patients for Virtual Visits (Telemedicine).  Patients are able to view lab/test results, encounter notes, upcoming appointments, etc.  Non-urgent messages can be sent to your provider as well.   To learn more about what you can do with MyChart, go to [email or web address].    Your next appointment:   6 month(s)  The format for your next appointment:   In Person  Provider:   Donato Heinz, MD {   Important Information About  Sugar          Signed, Donato Heinz, MD  08/11/2021 8:45 AM    [place]

## 2021-08-11 ENCOUNTER — Ambulatory Visit (INDEPENDENT_AMBULATORY_CARE_PROVIDER_SITE_OTHER): Payer: Medicare Other | Admitting: Cardiology

## 2021-08-11 ENCOUNTER — Encounter: Payer: Self-pay | Admitting: Cardiology

## 2021-08-11 VITALS — BP 123/72 | HR 64 | Ht 70.0 in | Wt 192.6 lb

## 2021-08-11 DIAGNOSIS — E785 Hyperlipidemia, unspecified: Secondary | ICD-10-CM

## 2021-08-11 DIAGNOSIS — I2510 Atherosclerotic heart disease of native coronary artery without angina pectoris: Secondary | ICD-10-CM

## 2021-08-11 DIAGNOSIS — I10 Essential (primary) hypertension: Secondary | ICD-10-CM | POA: Diagnosis not present

## 2021-08-11 DIAGNOSIS — Z951 Presence of aortocoronary bypass graft: Secondary | ICD-10-CM

## 2021-08-11 NOTE — Patient Instructions (Signed)
Medication Instructions:  Your physician recommends that you continue on your current medications as directed. Please refer to the Current Medication list given to you today.  *If you need a refill on your cardiac medications before your next appointment, please call your pharmacy*  Follow-Up: At [facility], you and your health needs are our priority.  As part of our continuing mission to provide you with exceptional heart care, we have created designated Provider Care Teams.  These Care Teams include your primary Cardiologist (physician) and Advanced Practice Providers (APPs -  Physician Assistants and Nurse Practitioners) who all work together to provide you with the care you need, when you need it.  We recommend signing up for the patient portal called "MyChart".  Sign up information is provided on this After Visit Summary.  MyChart is used to connect with patients for Virtual Visits (Telemedicine).  Patients are able to view lab/test results, encounter notes, upcoming appointments, etc.  Non-urgent messages can be sent to your provider as well.   To learn more about what you can do with MyChart, go to [email or web address].    Your next appointment:   6 month(s)  The format for your next appointment:   In Person  Provider:   Christopher L Schumann, MD {  Important Information About Sugar       

## 2021-08-26 ENCOUNTER — Ambulatory Visit: Payer: Medicare Other | Admitting: Cardiology

## 2021-09-01 DIAGNOSIS — M25531 Pain in right wrist: Secondary | ICD-10-CM | POA: Diagnosis not present

## 2021-12-03 DIAGNOSIS — E7800 Pure hypercholesterolemia, unspecified: Secondary | ICD-10-CM | POA: Diagnosis not present

## 2021-12-03 DIAGNOSIS — R972 Elevated prostate specific antigen [PSA]: Secondary | ICD-10-CM | POA: Diagnosis not present

## 2021-12-03 DIAGNOSIS — Z951 Presence of aortocoronary bypass graft: Secondary | ICD-10-CM | POA: Diagnosis not present

## 2021-12-03 DIAGNOSIS — D682 Hereditary deficiency of other clotting factors: Secondary | ICD-10-CM | POA: Diagnosis not present

## 2021-12-03 DIAGNOSIS — I10 Essential (primary) hypertension: Secondary | ICD-10-CM | POA: Diagnosis not present

## 2021-12-03 DIAGNOSIS — I2510 Atherosclerotic heart disease of native coronary artery without angina pectoris: Secondary | ICD-10-CM | POA: Diagnosis not present

## 2021-12-03 DIAGNOSIS — R7303 Prediabetes: Secondary | ICD-10-CM | POA: Diagnosis not present

## 2021-12-03 DIAGNOSIS — Z86711 Personal history of pulmonary embolism: Secondary | ICD-10-CM | POA: Diagnosis not present

## 2021-12-23 DIAGNOSIS — Z872 Personal history of diseases of the skin and subcutaneous tissue: Secondary | ICD-10-CM | POA: Diagnosis not present

## 2021-12-23 DIAGNOSIS — X32XXXA Exposure to sunlight, initial encounter: Secondary | ICD-10-CM | POA: Diagnosis not present

## 2021-12-23 DIAGNOSIS — L910 Hypertrophic scar: Secondary | ICD-10-CM | POA: Diagnosis not present

## 2021-12-23 DIAGNOSIS — Z7189 Other specified counseling: Secondary | ICD-10-CM | POA: Diagnosis not present

## 2021-12-23 DIAGNOSIS — L570 Actinic keratosis: Secondary | ICD-10-CM | POA: Diagnosis not present

## 2021-12-23 DIAGNOSIS — Z09 Encounter for follow-up examination after completed treatment for conditions other than malignant neoplasm: Secondary | ICD-10-CM | POA: Diagnosis not present

## 2021-12-23 DIAGNOSIS — L821 Other seborrheic keratosis: Secondary | ICD-10-CM | POA: Diagnosis not present

## 2021-12-23 DIAGNOSIS — D234 Other benign neoplasm of skin of scalp and neck: Secondary | ICD-10-CM | POA: Diagnosis not present

## 2022-01-04 DIAGNOSIS — Z23 Encounter for immunization: Secondary | ICD-10-CM | POA: Diagnosis not present

## 2022-02-07 NOTE — Progress Notes (Signed)
Cardiology Office Note:    Date:  02/10/2022   ID:  Jose Bowers, DOB December 17, 1950, MRN [id number]  PCP:  Wenda Low, MD  Cardiologist:  Donato Heinz, MD  Electrophysiologist:  None   Referring MD: Wenda Low, MD   No chief complaint on file.    History of Present Illness:    Jose Bowers is a 71 y.o. male with a hx of CAD status post CABG (LIMA-LAD, SVG-OM, SVG sequential to PDA and PLB) factor V Leiden, hypertension, hyperlipidemia, DVT following shoulder surgery in 2014, saddle PE treated with EKOS in 2019 who presents for follow-up.  He was referred by Dr. Lysle Rubens for evaluation of chest pain, initially seen on 09/19/2019.  Exercise Myoview was attempted on 10/04/2019.  He did not reach target heart rate on treadmill and was converted to pharmacologic study.  Despite not reaching target heart rate, he had 1.5 mm horizontal ST depressions in inferior and anterolateral leads and symptoms of chest pain.  MPI imaging showed reversible defect in apical anterior wall and apex consistent with LAD ischemia.  LVEF 49%.  Cardiac catheterization on 10/16/2019 showed severe heavily calcified multivessel disease (99% proximal LAD, 90% proximal circumflex, 80% distal RCA, 90% RPDA).  Underwent CABG 10/18/19 ((LIMA-LAD, SVG-OM, SVG sequential to PDA and PLB).  Echocardiogram 10/16/2019 showed normal biventricular function, no significant valvular disease.  Since last clinic visit, he reports has been doing well.  Denies any chest pain, dyspnea, lightheadedness, syncope, lower extremity edema, or palpitations.  Reports BP 120-130s at home.  Reports walks up to 6 miles per day.  Reports epigastric pain when exercising after eating on occasion, but he can continue to exercise and it will resolve.  Denies any bleeding on Xarelto.   Wt Readings from Last 3 Encounters:  02/10/22 192 lb 12.8 oz (87.5 kg)  08/11/21 192 lb 9.6 oz (87.4 kg)  02/20/21 193 lb 6.4 oz (87.7 kg)     Past Medical  History:  Diagnosis Date   Factor V Leiden ([place])    Hypertension     Past Surgical History:  Procedure Laterality Date   CORONARY ARTERY BYPASS GRAFT N/A 10/18/2019   Procedure: CORONARY ARTERY BYPASS GRAFTING (CABG), ON PUMP, TIMES FOUR, USING LEFT INTERAL MAMMARY ATERY AND ENDOSCOPICALLY HARVESTED RIGHT GREATER SAPHENOUS VEIN;  Surgeon: Gaye Pollack, MD;  Location: [facility];  Service: Open Heart Surgery;  Laterality: N/A;   HERNIA REPAIR     IR ANGIOGRAM PULMONARY BILATERAL SELECTIVE  03/19/2017   IR ANGIOGRAM SELECTIVE EACH ADDITIONAL VESSEL  03/19/2017   IR ANGIOGRAM SELECTIVE EACH ADDITIONAL VESSEL  03/19/2017   IR INFUSION THROMBOL ARTERIAL INITIAL (MS)  03/19/2017   IR INFUSION THROMBOL ARTERIAL INITIAL (MS)  03/19/2017   IR THROMB F/U EVAL ART/VEN FINAL DAY (MS)  03/20/2017   IR US GUIDE VASC ACCESS RIGHT  03/19/2017   JOINT REPLACEMENT     LEFT HEART CATH AND CORONARY ANGIOGRAPHY N/A 10/16/2019   Procedure: LEFT HEART CATH AND CORONARY ANGIOGRAPHY;  Surgeon: Leonie Man, MD;  Location: [facility];  Service: Cardiovascular;  Laterality: N/A;   ROTATOR CUFF REPAIR     TEE WITHOUT CARDIOVERSION N/A 10/18/2019   Procedure: TRANSESOPHAGEAL ECHOCARDIOGRAM (TEE);  Surgeon: Gaye Pollack, MD;  Location: [facility];  Service: Open Heart Surgery;  Laterality: N/A;    Current Medications: Current Meds  Medication Sig   atorvastatin (LIPITOR) 80 MG tablet TAKE 1 TABLET BY MOUTH DAILY AT 6 PM.   metoprolol succinate (TOPROL-XL)  25 MG 24 hr tablet TAKE 1 TABLET (25 MG TOTAL) BY MOUTH DAILY.   XARELTO 20 MG TABS tablet Take 20 mg by mouth daily.     Allergies:   Patient has no known allergies.   Social History   Socioeconomic History   Marital status: Married    Spouse name: Not on file   Number of children: Not on file   Years of education: Not on file   Highest education level: Not on file  Occupational History   Occupation: retired     Comment: Dietitian   Tobacco Use    Smoking status: Never   Smokeless tobacco: Never  Vaping Use   Vaping Use: Never used  Substance and Sexual Activity   Alcohol use: Yes    Comment: "few times a week"   Drug use: No   Sexual activity: Not on file  Other Topics Concern   Not on file  Social History Narrative   Not on file   Social Determinants of Health   Financial Resource Strain: Not on file  Food Insecurity: Not on file  Transportation Needs: Not on file  Physical Activity: Not on file  Stress: Not on file  Social Connections: Not on file     Family History: The patient's family history includes Clotting disorder in his brother, father, grandchild, and son.  ROS:   Please see the history of present illness.     All other systems reviewed and are negative.  EKGs/Labs/Other Studies Reviewed:    The following studies were reviewed today:  Intraoperative TEE 10/18/2019: - Left Ventricle: The left ventricle is essentially unchanged from  pre-bypass.  - Right Ventricle: The right ventricle appears unchanged from pre-bypass.  - Aorta: The aorta appears unchanged from pre-bypass.  - Left Atrium: The left atrium appears unchanged from pre-bypass.  - Left Atrial Appendage: The left atrial appendage appears unchanged from  pre-bypass.  - Aortic Valve: The aortic valve appears unchanged from pre-bypass.  - Mitral Valve: The mitral valve appears essentially unchanged from  pre-bypass.  - Tricuspid Valve: The tricuspid valve appears essentially unchanged from  pre-bypass.  - Interatrial Septum: The interatrial septum appears unchanged from  pre-bypass.  - Interventricular Septum: The interventricular septum appears unchanged  from  pre-bypass.  - Pericardium: The pericardium appears unchanged from pre-bypass.   US Doppler Pre CABG 10/17/2019: Right Carotid: Velocities in the right ICA are consistent with a 1-39%  stenosis.  Left Carotid: Velocities in the left ICA are consistent with a 1-39%  stenosis.   Vertebrals:  Bilateral vertebral arteries demonstrate antegrade flow.  Subclavians: Normal flow hemodynamics were seen in bilateral subclavian arteries.  Right Upper Extremity: Doppler waveforms remain within normal limits with right radial compression. Doppler waveforms remain within normal limits with right ulnar compression.  Left Upper Extremity: Doppler waveforms remain within normal limits with left radial compression. Doppler waveforms remain within normal limits with left ulnar compression.   Echo 10/16/2019:  1. Left ventricular ejection fraction, by estimation, is 55 to 60%. The  left ventricle has normal function. The left ventricle has no regional  wall motion abnormalities. There is mild left ventricular hypertrophy of  the basal-septal segment. Left  ventricular diastolic parameters are consistent with Grade I diastolic  dysfunction (impaired relaxation).   2. Right ventricular systolic function is normal. The right ventricular  size is normal. Tricuspid regurgitation signal is inadequate for assessing  PA pressure.   3. Left atrial size was mildly dilated.  4. The mitral valve is normal in structure. No evidence of mitral valve  regurgitation. No evidence of mitral stenosis.   5. The aortic valve is normal in structure. Aortic valve regurgitation is  not visualized. No aortic stenosis is present.   6. Aortic dilatation noted. There is borderline dilatation of the aortic  root measuring 38 mm.   7. The inferior vena cava is normal in size with greater than 50%  respiratory variability, suggesting right atrial pressure of 3 mmHg.   Comparison(s): No significant change from prior study. Prior images  reviewed side by side.   LHC 10/16/2019: -- LEFT VENTRICULAR HEMODYNAMICS & VENTRICULOGRAPHY There is mild left ventricular systolic dysfunction. LV end diastolic pressure is normal. The left ventricular ejection fraction is 45-50% by visual estimate. -- CORONARY  ANGIOGRAPHY Prox LAD to Mid LAD lesion is 99% stenosed with 50% stenosed side branch in 1st Diag. Prox Cx lesion is 90% stenosed. Dist RCA lesion is 80% stenosed with 90% stenosed side branch in RPDA. SUMMARY  Severe heavily calcified multivessel disease: Heavily calcified proximal LAD subtotal occlusion (99%) lesion beginning just prior to major SP trunk, terminating after 1st Diag --> remainder the LAD is relatively free of disease with TIMI I flow and competitive flow from right left collaterals to the apex Heavily calcified very focal 90% eccentric stenosis in the proximal LCx--LCx gives off 2 major Mrg branches and a posterior AV groove branch with 2 small PL branches--free of disease. Heavily calcified dominant RCA with diffuse 30% mid to distal disease, and 80% focal stenosis just prior to RPAV continuing into the RPDA with 90% ostial PDA lesion. Mildly reduced LVEF of roughly 45% with mid to apical anterior hypokinesis RECOMMENDATION Admit under Dr. Newman Nickels service for medication optimization and CVTS consultation He will be started on IV Heparin while we continue to hold his Xarelto. Increase to Max dose Atorvastatin & adjust BP medications    Lexiscan Stress Test 10/04/2019: Nuclear stress EF: 49%. The left ventricular ejection fraction is mildly decreased (45-54%). Horizontal ST segment depression ST segment depression of 1.5 mm was noted during stress in the II, III, aVF, V5 and V6 leads, beginning at 7 minutes of stress, and returning to baseline after less than 1 minute of recovery. Findings consistent with ischemia. This is a high risk study.   1. Initial ECG treadmill stress did not reach target HR and was converted to pharmacological study. Despite not reaching target HR, the patient had 1.5 mm horizontal ST depression in inferior leads (II, III, AVF) and anterolateral leads (V5-6) and symptoms of chest pain. 2. On MPI imaging, there is a medium size (10-12% of the LV),  moderate perfusion defect present on stress imaging in the apical anterior, and apex consistent with ischemia in the LAD distrubution.  3. LVEF is normal for this modality, 49%.  4. Average exercise capacity (8:11 min:s; 7.0 METS). 5. Overall, this is a high risk study concerning for ischemia.  EKG:   02/20/21: NSR, rate 81, no ST abnormalities 08/18/2020: NSR, rate 57, No ST abnormalities 11/16/2019: normal sinus rhythm, rate 63, no ST abnormalities 10/09/2019: NSR, rate 72, no ST abnormalities  Recent Labs: 03/03/2021: BUN 16; Creatinine, Ser 0.87; Potassium 4.8; Sodium 143  Recent Lipid Panel    Component Value Date/Time   CHOL 114 01/18/2020 0845   TRIG 74 01/18/2020 0845   HDL 48 01/18/2020 0845   CHOLHDL 2.4 01/18/2020 0845   CHOLHDL 3.6 10/18/2019 0511   VLDL 24 10/18/2019 0511  [place] 51 01/18/2020 0845    Physical Exam:    VS:  BP 130/72   Pulse 70   Ht '5\' 10"'$  (1.778 m)   Wt 192 lb 12.8 oz (87.5 kg)   SpO2 99%   BMI 27.66 kg/m     Wt Readings from Last 3 Encounters:  02/10/22 192 lb 12.8 oz (87.5 kg)  08/11/21 192 lb 9.6 oz (87.4 kg)  02/20/21 193 lb 6.4 oz (87.7 kg)     GEN: in no acute distress HEENT: Normal NECK: No JVD; No carotid bruits CARDIAC: RRR, no murmurs, rubs, gallops RESPIRATORY:  Clear to auscultation without rales, wheezing or rhonchi  ABDOMEN: Soft, non-tender, non-distended MUSCULOSKELETAL:  No edema SKIN: Warm and dry NEUROLOGIC:  Alert and oriented x 3 PSYCHIATRIC:  Normal affect   ASSESSMENT:    1. CAD in native artery   2. S/P CABG (coronary artery bypass graft)   3. Essential hypertension   4. Hyperlipidemia, unspecified hyperlipidemia type      PLAN:    CAD: Cardiac catheterization on 10/16/2019 showed severe heavily calcified multivessel disease (99% proximal LAD, 90% proximal circumflex, 80% distal RCA, 90% RPDA).  Underwent CABG 10/18/19 (LIMA-LAD, SVG-OM, SVG sequential to PDA and PLB).  Echocardiogram on 10/16/2019 showed  normal biventricular function, no significant valvular disease. -Continue Xarelto  -Continue atorvastatin 80 mg daily -Continue Toprol-XL 25 mg daily  Hypertension: continue losartan 50 mg daily and Toprol-XL 25 mg daily.  Appears controlled.  Hyperlipidemia: On atorvastatin 80 mg daily, LDL 61 on 06/02/21  Factor V Leiden: History of DVT and PE.  On Xarelto    RTC in 6 months   Medication Adjustments/Labs and Tests Ordered: Current medicines are reviewed at length with the patient today.  Concerns regarding medicines are outlined above.  No orders of the defined types were placed in this encounter.   No orders of the defined types were placed in this encounter.    Patient Instructions  Medication Instructions:  Your physician recommends that you continue on your current medications as directed. Please refer to the Current Medication list given to you today.  *If you need a refill on your cardiac medications before your next appointment, please call your pharmacy*  Follow-Up: At [facility], you and your health needs are our priority.  As part of our continuing mission to provide you with exceptional heart care, we have created designated Provider Care Teams.  These Care Teams include your primary Cardiologist (physician) and Advanced Practice Providers (APPs -  Physician Assistants and Nurse Practitioners) who all work together to provide you with the care you need, when you need it.  We recommend signing up for the patient portal called "MyChart".  Sign up information is provided on this After Visit Summary.  MyChart is used to connect with patients for Virtual Visits (Telemedicine).  Patients are able to view lab/test results, encounter notes, upcoming appointments, etc.  Non-urgent messages can be sent to your provider as well.   To learn more about what you can do with MyChart, go to [email or web address].    Your next appointment:   6 month(s)  The format for  your next appointment:   In Person  Provider:   Donato Heinz, MD               Signed, Donato Heinz, MD  02/10/2022 9:16 AM    [place]

## 2022-02-10 ENCOUNTER — Encounter: Payer: Self-pay | Admitting: Cardiology

## 2022-02-10 ENCOUNTER — Ambulatory Visit: Payer: Medicare Other | Attending: Cardiology | Admitting: Cardiology

## 2022-02-10 VITALS — BP 130/72 | HR 70 | Ht 70.0 in | Wt 192.8 lb

## 2022-02-10 DIAGNOSIS — I2510 Atherosclerotic heart disease of native coronary artery without angina pectoris: Secondary | ICD-10-CM | POA: Diagnosis not present

## 2022-02-10 DIAGNOSIS — Z951 Presence of aortocoronary bypass graft: Secondary | ICD-10-CM

## 2022-02-10 DIAGNOSIS — E785 Hyperlipidemia, unspecified: Secondary | ICD-10-CM | POA: Diagnosis not present

## 2022-02-10 DIAGNOSIS — I10 Essential (primary) hypertension: Secondary | ICD-10-CM

## 2022-02-10 NOTE — Patient Instructions (Signed)
Medication Instructions:  Your physician recommends that you continue on your current medications as directed. Please refer to the Current Medication list given to you today.  *If you need a refill on your cardiac medications before your next appointment, please call your pharmacy*  Follow-Up: At [facility], you and your health needs are our priority.  As part of our continuing mission to provide you with exceptional heart care, we have created designated Provider Care Teams.  These Care Teams include your primary Cardiologist (physician) and Advanced Practice Providers (APPs -  Physician Assistants and Nurse Practitioners) who all work together to provide you with the care you need, when you need it.  We recommend signing up for the patient portal called "MyChart".  Sign up information is provided on this After Visit Summary.  MyChart is used to connect with patients for Virtual Visits (Telemedicine).  Patients are able to view lab/test results, encounter notes, upcoming appointments, etc.  Non-urgent messages can be sent to your provider as well.   To learn more about what you can do with MyChart, go to [email or web address].    Your next appointment:   6 month(s)  The format for your next appointment:   In Person  Provider:   Christopher L Schumann, MD         

## 2022-03-25 DIAGNOSIS — Z23 Encounter for immunization: Secondary | ICD-10-CM | POA: Diagnosis not present

## 2022-05-05 ENCOUNTER — Other Ambulatory Visit: Payer: Self-pay | Admitting: Cardiology

## 2022-05-27 ENCOUNTER — Encounter: Payer: Self-pay | Admitting: Cardiology

## 2022-06-04 DIAGNOSIS — R7309 Other abnormal glucose: Secondary | ICD-10-CM | POA: Diagnosis not present

## 2022-06-04 DIAGNOSIS — E7800 Pure hypercholesterolemia, unspecified: Secondary | ICD-10-CM | POA: Diagnosis not present

## 2022-06-04 DIAGNOSIS — Z86711 Personal history of pulmonary embolism: Secondary | ICD-10-CM | POA: Diagnosis not present

## 2022-06-04 DIAGNOSIS — D682 Hereditary deficiency of other clotting factors: Secondary | ICD-10-CM | POA: Diagnosis not present

## 2022-06-04 DIAGNOSIS — R7303 Prediabetes: Secondary | ICD-10-CM | POA: Diagnosis not present

## 2022-06-04 DIAGNOSIS — Z951 Presence of aortocoronary bypass graft: Secondary | ICD-10-CM | POA: Diagnosis not present

## 2022-06-04 DIAGNOSIS — I2510 Atherosclerotic heart disease of native coronary artery without angina pectoris: Secondary | ICD-10-CM | POA: Diagnosis not present

## 2022-06-04 DIAGNOSIS — Z1389 Encounter for screening for other disorder: Secondary | ICD-10-CM | POA: Diagnosis not present

## 2022-06-04 DIAGNOSIS — Z0000 Encounter for general adult medical examination without abnormal findings: Secondary | ICD-10-CM | POA: Diagnosis not present

## 2022-06-04 DIAGNOSIS — R972 Elevated prostate specific antigen [PSA]: Secondary | ICD-10-CM | POA: Diagnosis not present

## 2022-06-04 DIAGNOSIS — I10 Essential (primary) hypertension: Secondary | ICD-10-CM | POA: Diagnosis not present

## 2022-08-09 NOTE — Progress Notes (Deleted)
Cardiology Office Note:    Date:  08/09/2022   ID:  Jose Bowers, DOB [birth year], MRN [id number]  PCP:  Georgann Housekeeper, MD  Cardiologist:  Little Ishikawa, MD  Electrophysiologist:  None   Referring MD: Georgann Housekeeper, MD   No chief complaint on file.    History of Present Illness:    Jose Bowers is a 72 y.o. male with a hx of CAD status post CABG (LIMA-LAD, SVG-OM, SVG sequential to PDA and PLB) factor V Leiden, hypertension, hyperlipidemia, DVT following shoulder surgery in 2014, saddle PE treated with EKOS in 2019 who presents for follow-up.  He was referred by Dr. Donette Larry for evaluation of chest pain, initially seen on 09/19/2019.  Exercise Myoview was attempted on 10/04/2019.  He did not reach target heart rate on treadmill and was converted to pharmacologic study.  Despite not reaching target heart rate, he had 1.5 mm horizontal ST depressions in inferior and anterolateral leads and symptoms of chest pain.  MPI imaging showed reversible defect in apical anterior wall and apex consistent with LAD ischemia.  LVEF 49%.  Cardiac catheterization on 10/16/2019 showed severe heavily calcified multivessel disease (99% proximal LAD, 90% proximal circumflex, 80% distal RCA, 90% RPDA).  Underwent CABG 10/18/19 ((LIMA-LAD, SVG-OM, SVG sequential to PDA and PLB).  Echocardiogram 10/16/2019 showed normal biventricular function, no significant valvular disease.  Since last clinic visit,  he reports has been doing well.  Denies any chest pain, dyspnea, lightheadedness, syncope, lower extremity edema, or palpitations.  Reports BP 120-130s at home.  Reports walks up to 6 miles per day.  Reports epigastric pain when exercising after eating on occasion, but he can continue to exercise and it will resolve.  Denies any bleeding on Xarelto.   Wt Readings from Last 3 Encounters:  02/10/22 192 lb 12.8 oz (87.5 kg)  08/11/21 192 lb 9.6 oz (87.4 kg)  02/20/21 193 lb 6.4 oz (87.7 kg)     Past Medical  History:  Diagnosis Date   Factor V Leiden (HCC)    Hypertension     Past Surgical History:  Procedure Laterality Date   CORONARY ARTERY BYPASS GRAFT N/A 10/18/2019   Procedure: CORONARY ARTERY BYPASS GRAFTING (CABG), ON PUMP, TIMES FOUR, USING LEFT INTERAL MAMMARY ATERY AND ENDOSCOPICALLY HARVESTED RIGHT GREATER SAPHENOUS VEIN;  Surgeon: Alleen Borne, MD;  Location: [facility] OR;  Service: Open Heart Surgery;  Laterality: N/A;   HERNIA REPAIR     IR ANGIOGRAM PULMONARY BILATERAL SELECTIVE  03/19/2017   IR ANGIOGRAM SELECTIVE EACH ADDITIONAL VESSEL  03/19/2017   IR ANGIOGRAM SELECTIVE EACH ADDITIONAL VESSEL  03/19/2017   IR INFUSION THROMBOL ARTERIAL INITIAL (MS)  03/19/2017   IR INFUSION THROMBOL ARTERIAL INITIAL (MS)  03/19/2017   IR THROMB F/U EVAL ART/VEN FINAL DAY (MS)  03/20/2017   IR US GUIDE VASC ACCESS RIGHT  03/19/2017   JOINT REPLACEMENT     LEFT HEART CATH AND CORONARY ANGIOGRAPHY N/A 10/16/2019   Procedure: LEFT HEART CATH AND CORONARY ANGIOGRAPHY;  Surgeon: Marykay Lex, MD;  Location: [facility] INVASIVE CV LAB;  Service: Cardiovascular;  Laterality: N/A;   ROTATOR CUFF REPAIR     TEE WITHOUT CARDIOVERSION N/A 10/18/2019   Procedure: TRANSESOPHAGEAL ECHOCARDIOGRAM (TEE);  Surgeon: Alleen Borne, MD;  Location: [facility] OR;  Service: Open Heart Surgery;  Laterality: N/A;    Current Medications: No outpatient medications have been marked as taking for the 08/11/22 encounter (Appointment) with Little Ishikawa, MD.     Allergies:  Patient has no known allergies.   Social History   Socioeconomic History   Marital status: Married    Spouse name: Not on file   Number of children: Not on file   Years of education: Not on file   Highest education level: Not on file  Occupational History   Occupation: retired     Comment: Dentist   Tobacco Use   Smoking status: Never   Smokeless tobacco: Never  Vaping Use   Vaping Use: Never used  Substance and Sexual Activity   Alcohol use:  Yes    Comment: "few times a week"   Drug use: No   Sexual activity: Not on file  Other Topics Concern   Not on file  Social History Narrative   Not on file   Social Determinants of Health   Financial Resource Strain: Not on file  Food Insecurity: Not on file  Transportation Needs: Not on file  Physical Activity: Not on file  Stress: Not on file  Social Connections: Not on file     Family History: The patient's family history includes Clotting disorder in his brother, father, grandchild, and son.  ROS:   Please see the history of present illness.     All other systems reviewed and are negative.  EKGs/Labs/Other Studies Reviewed:    The following studies were reviewed today:  Intraoperative TEE 10/18/2019: - Left Ventricle: The left ventricle is essentially unchanged from  pre-bypass.  - Right Ventricle: The right ventricle appears unchanged from pre-bypass.  - Aorta: The aorta appears unchanged from pre-bypass.  - Left Atrium: The left atrium appears unchanged from pre-bypass.  - Left Atrial Appendage: The left atrial appendage appears unchanged from  pre-bypass.  - Aortic Valve: The aortic valve appears unchanged from pre-bypass.  - Mitral Valve: The mitral valve appears essentially unchanged from  pre-bypass.  - Tricuspid Valve: The tricuspid valve appears essentially unchanged from  pre-bypass.  - Interatrial Septum: The interatrial septum appears unchanged from  pre-bypass.  - Interventricular Septum: The interventricular septum appears unchanged  from  pre-bypass.  - Pericardium: The pericardium appears unchanged from pre-bypass.   US Doppler Pre CABG 10/17/2019: Right Carotid: Velocities in the right ICA are consistent with a 1-39%  stenosis.  Left Carotid: Velocities in the left ICA are consistent with a 1-39%  stenosis.  Vertebrals:  Bilateral vertebral arteries demonstrate antegrade flow.  Subclavians: Normal flow hemodynamics were seen in bilateral  subclavian arteries.  Right Upper Extremity: Doppler waveforms remain within normal limits with right radial compression. Doppler waveforms remain within normal limits with right ulnar compression.  Left Upper Extremity: Doppler waveforms remain within normal limits with left radial compression. Doppler waveforms remain within normal limits with left ulnar compression.   Echo 10/16/2019:  1. Left ventricular ejection fraction, by estimation, is 55 to 60%. The  left ventricle has normal function. The left ventricle has no regional  wall motion abnormalities. There is mild left ventricular hypertrophy of  the basal-septal segment. Left  ventricular diastolic parameters are consistent with Grade I diastolic  dysfunction (impaired relaxation).   2. Right ventricular systolic function is normal. The right ventricular  size is normal. Tricuspid regurgitation signal is inadequate for assessing  PA pressure.   3. Left atrial size was mildly dilated.   4. The mitral valve is normal in structure. No evidence of mitral valve  regurgitation. No evidence of mitral stenosis.   5. The aortic valve is normal in structure. Aortic valve regurgitation  is  not visualized. No aortic stenosis is present.   6. Aortic dilatation noted. There is borderline dilatation of the aortic  root measuring 38 mm.   7. The inferior vena cava is normal in size with greater than 50%  respiratory variability, suggesting right atrial pressure of 3 mmHg.   Comparison(s): No significant change from prior study. Prior images  reviewed side by side.   LHC 10/16/2019: -- LEFT VENTRICULAR HEMODYNAMICS & VENTRICULOGRAPHY There is mild left ventricular systolic dysfunction. LV end diastolic pressure is normal. The left ventricular ejection fraction is 45-50% by visual estimate. -- CORONARY ANGIOGRAPHY Prox LAD to Mid LAD lesion is 99% stenosed with 50% stenosed side branch in 1st Diag. Prox Cx lesion is 90% stenosed. Dist RCA lesion  is 80% stenosed with 90% stenosed side branch in RPDA. SUMMARY  Severe heavily calcified multivessel disease: Heavily calcified proximal LAD subtotal occlusion (99%) lesion beginning just prior to major SP trunk, terminating after 1st Diag --> remainder the LAD is relatively free of disease with TIMI I flow and competitive flow from right left collaterals to the apex Heavily calcified very focal 90% eccentric stenosis in the proximal LCx--LCx gives off 2 major Mrg branches and a posterior AV groove branch with 2 small PL branches--free of disease. Heavily calcified dominant RCA with diffuse 30% mid to distal disease, and 80% focal stenosis just prior to RPAV continuing into the RPDA with 90% ostial PDA lesion. Mildly reduced LVEF of roughly 45% with mid to apical anterior hypokinesis RECOMMENDATION Admit under Dr. Campbell Lerner service for medication optimization and CVTS consultation He will be started on IV Heparin while we continue to hold his Xarelto. Increase to Max dose Atorvastatin & adjust BP medications    Lexiscan Stress Test 10/04/2019: Nuclear stress EF: 49%. The left ventricular ejection fraction is mildly decreased (45-54%). Horizontal ST segment depression ST segment depression of 1.5 mm was noted during stress in the II, III, aVF, V5 and V6 leads, beginning at 7 minutes of stress, and returning to baseline after less than 1 minute of recovery. Findings consistent with ischemia. This is a high risk study.   1. Initial ECG treadmill stress did not reach target HR and was converted to pharmacological study. Despite not reaching target HR, the patient had 1.5 mm horizontal ST depression in inferior leads (II, III, AVF) and anterolateral leads (V5-6) and symptoms of chest pain. 2. On MPI imaging, there is a medium size (10-12% of the LV), moderate perfusion defect present on stress imaging in the apical anterior, and apex consistent with ischemia in the LAD distrubution.  3. LVEF is  normal for this modality, 49%.  4. Average exercise capacity (8:11 min:s; 7.0 METS). 5. Overall, this is a high risk study concerning for ischemia.  EKG:   02/20/21: NSR, rate 81, no ST abnormalities 08/18/2020: NSR, rate 57, No ST abnormalities 11/16/2019: normal sinus rhythm, rate 63, no ST abnormalities 10/09/2019: NSR, rate 72, no ST abnormalities  Recent Labs: No results found for requested labs within last 365 days.  Recent Lipid Panel    Component Value Date/Time   CHOL 114 01/18/2020 0845   TRIG 74 01/18/2020 0845   HDL 48 01/18/2020 0845   CHOLHDL 2.4 01/18/2020 0845   CHOLHDL 3.6 10/18/2019 0511   VLDL 24 10/18/2019 0511   LDLCALC 51 01/18/2020 0845    Physical Exam:    VS:  There were no vitals taken for this visit.    Wt Readings from Last 3 Encounters:  02/10/22 192 lb 12.8 oz (87.5 kg)  08/11/21 192 lb 9.6 oz (87.4 kg)  02/20/21 193 lb 6.4 oz (87.7 kg)     GEN: in no acute distress HEENT: Normal NECK: No JVD; No carotid bruits CARDIAC: RRR, no murmurs, rubs, gallops RESPIRATORY:  Clear to auscultation without rales, wheezing or rhonchi  ABDOMEN: Soft, non-tender, non-distended MUSCULOSKELETAL:  No edema SKIN: Warm and dry NEUROLOGIC:  Alert and oriented x 3 PSYCHIATRIC:  Normal affect   ASSESSMENT:    No diagnosis found.    PLAN:    CAD: Cardiac catheterization on 10/16/2019 showed severe heavily calcified multivessel disease (99% proximal LAD, 90% proximal circumflex, 80% distal RCA, 90% RPDA).  Underwent CABG 10/18/19 (LIMA-LAD, SVG-OM, SVG sequential to PDA and PLB).  Echocardiogram on 10/16/2019 showed normal biventricular function, no significant valvular disease. -Continue Xarelto  -Continue atorvastatin 80 mg daily -Continue Toprol-XL 25 mg daily  Hypertension: continue losartan 50 mg daily and Toprol-XL 25 mg daily.  Appears controlled.  Hyperlipidemia: On atorvastatin 80 mg daily, LDL 61 on 06/02/21  Factor V Leiden: History of DVT and PE.  On  Xarelto    RTC in 6 months   Medication Adjustments/Labs and Tests Ordered: Current medicines are reviewed at length with the patient today.  Concerns regarding medicines are outlined above.  No orders of the defined types were placed in this encounter.   No orders of the defined types were placed in this encounter.    There are no Patient Instructions on file for this visit.    Signed, Little Ishikawa, MD  08/09/2022 8:48 PM    [facility]

## 2022-08-11 ENCOUNTER — Ambulatory Visit: Payer: Medicare Other | Admitting: Cardiology

## 2022-08-18 DIAGNOSIS — L820 Inflamed seborrheic keratosis: Secondary | ICD-10-CM | POA: Diagnosis not present

## 2022-08-18 DIAGNOSIS — L538 Other specified erythematous conditions: Secondary | ICD-10-CM | POA: Diagnosis not present

## 2022-08-18 DIAGNOSIS — L821 Other seborrheic keratosis: Secondary | ICD-10-CM | POA: Diagnosis not present

## 2022-08-18 DIAGNOSIS — Z789 Other specified health status: Secondary | ICD-10-CM | POA: Diagnosis not present

## 2022-08-18 DIAGNOSIS — D485 Neoplasm of uncertain behavior of skin: Secondary | ICD-10-CM | POA: Diagnosis not present

## 2022-08-18 DIAGNOSIS — R208 Other disturbances of skin sensation: Secondary | ICD-10-CM | POA: Diagnosis not present

## 2022-09-14 DIAGNOSIS — R972 Elevated prostate specific antigen [PSA]: Secondary | ICD-10-CM | POA: Diagnosis not present

## 2022-09-18 ENCOUNTER — Encounter: Payer: Self-pay | Admitting: Cardiology

## 2022-10-14 ENCOUNTER — Ambulatory Visit: Payer: Medicare Other | Attending: Cardiology | Admitting: Cardiology

## 2022-10-14 ENCOUNTER — Encounter: Payer: Self-pay | Admitting: Cardiology

## 2022-10-14 VITALS — BP 122/74 | HR 62 | Ht 70.0 in | Wt 178.0 lb

## 2022-10-14 DIAGNOSIS — E785 Hyperlipidemia, unspecified: Secondary | ICD-10-CM | POA: Diagnosis not present

## 2022-10-14 DIAGNOSIS — I2510 Atherosclerotic heart disease of native coronary artery without angina pectoris: Secondary | ICD-10-CM | POA: Insufficient documentation

## 2022-10-14 DIAGNOSIS — I10 Essential (primary) hypertension: Secondary | ICD-10-CM | POA: Diagnosis not present

## 2022-10-14 NOTE — Patient Instructions (Signed)
Medication Instructions:  Continue same medications *If you need a refill on your cardiac medications before your next appointment, please call your pharmacy*   Lab Work: None ordered   Testing/Procedures: None ordered   Follow-Up: At [facility], you and your health needs are our priority.  As part of our continuing mission to provide you with exceptional heart care, we have created designated Provider Care Teams.  These Care Teams include your primary Cardiologist (physician) and Advanced Practice Providers (APPs -  Physician Assistants and Nurse Practitioners) who all work together to provide you with the care you need, when you need it.  We recommend signing up for the patient portal called "MyChart".  Sign up information is provided on this After Visit Summary.  MyChart is used to connect with patients for Virtual Visits (Telemedicine).  Patients are able to view lab/test results, encounter notes, upcoming appointments, etc.  Non-urgent messages can be sent to your provider as well.   To learn more about what you can do with MyChart, go to [email or web address].    Your next appointment:  6 months    Call in Oct to schedule Feb appointment     Provider:  Dr.Schumann

## 2022-10-14 NOTE — Progress Notes (Signed)
Cardiology Office Note:    Date:  10/14/2022   ID:  Jose Bowers, DOB 1950-04-27, MRN [id number]  PCP:  Georgann Housekeeper, MD  Cardiologist:  Little Ishikawa, MD  Electrophysiologist:  None   Referring MD: Georgann Housekeeper, MD   Chief Complaint  Patient presents with   Coronary Artery Disease     History of Present Illness:    Jose Bowers is a 72 y.o. male with a hx of CAD status post CABG (LIMA-LAD, SVG-OM, SVG sequential to PDA and PLB) factor V Leiden, hypertension, hyperlipidemia, DVT following shoulder surgery in 2014, saddle PE treated with EKOS in 2019 who presents for follow-up.  He was referred by Dr. Donette Larry for evaluation of chest pain, initially seen on 09/19/2019.  Exercise Myoview was attempted on 10/04/2019.  He did not reach target heart rate on treadmill and was converted to pharmacologic study.  Despite not reaching target heart rate, he had 1.5 mm horizontal ST depressions in inferior and anterolateral leads and symptoms of chest pain.  MPI imaging showed reversible defect in apical anterior wall and apex consistent with LAD ischemia.  LVEF 49%.  Cardiac catheterization on 10/16/2019 showed severe heavily calcified multivessel disease (99% proximal LAD, 90% proximal circumflex, 80% distal RCA, 90% RPDA).  Underwent CABG 10/18/19 ((LIMA-LAD, SVG-OM, SVG sequential to PDA and PLB).  Echocardiogram 10/16/2019 showed normal biventricular function, no significant valvular disease.  Since last clinic visit, reports he is doing well.  He had COVID last month, reports he is recovering well.  Went to a wedding in [place] recently and then toward [place] for a week, walked over 40 miles during his trip.  Denies any exertional chest pain or dyspnea.  Does sometimes report chest pain if he exercises right after eating, otherwise denies any exertional symptoms.  Denies any lightheadedness or syncope.  Reports no bleeding on Xarelto.  Wt Readings from Last 3 Encounters:  10/14/22 178 lb  (80.7 kg)  02/10/22 192 lb 12.8 oz (87.5 kg)  08/11/21 192 lb 9.6 oz (87.4 kg)     Past Medical History:  Diagnosis Date   Factor V Leiden (HCC)    Hypertension     Past Surgical History:  Procedure Laterality Date   CORONARY ARTERY BYPASS GRAFT N/A 10/18/2019   Procedure: CORONARY ARTERY BYPASS GRAFTING (CABG), ON PUMP, TIMES FOUR, USING LEFT INTERAL MAMMARY ATERY AND ENDOSCOPICALLY HARVESTED RIGHT GREATER SAPHENOUS VEIN;  Surgeon: Alleen Borne, MD;  Location: [facility] OR;  Service: Open Heart Surgery;  Laterality: N/A;   HERNIA REPAIR     IR ANGIOGRAM PULMONARY BILATERAL SELECTIVE  03/19/2017   IR ANGIOGRAM SELECTIVE EACH ADDITIONAL VESSEL  03/19/2017   IR ANGIOGRAM SELECTIVE EACH ADDITIONAL VESSEL  03/19/2017   IR INFUSION THROMBOL ARTERIAL INITIAL (MS)  03/19/2017   IR INFUSION THROMBOL ARTERIAL INITIAL (MS)  03/19/2017   IR THROMB F/U EVAL ART/VEN FINAL DAY (MS)  03/20/2017   IR US GUIDE VASC ACCESS RIGHT  03/19/2017   JOINT REPLACEMENT     LEFT HEART CATH AND CORONARY ANGIOGRAPHY N/A 10/16/2019   Procedure: LEFT HEART CATH AND CORONARY ANGIOGRAPHY;  Surgeon: Marykay Lex, MD;  Location: [facility] INVASIVE CV LAB;  Service: Cardiovascular;  Laterality: N/A;   ROTATOR CUFF REPAIR     TEE WITHOUT CARDIOVERSION N/A 10/18/2019   Procedure: TRANSESOPHAGEAL ECHOCARDIOGRAM (TEE);  Surgeon: Alleen Borne, MD;  Location: [facility] OR;  Service: Open Heart Surgery;  Laterality: N/A;    Current Medications: Current Meds  Medication Sig  atorvastatin (LIPITOR) 80 MG tablet TAKE 1 TABLET BY MOUTH DAILY AT 6 PM.   metoprolol succinate (TOPROL-XL) 25 MG 24 hr tablet TAKE 1 TABLET (25 MG TOTAL) BY MOUTH DAILY.   XARELTO 20 MG TABS tablet Take 20 mg by mouth daily.     Allergies:   Patient has no known allergies.   Social History   Socioeconomic History   Marital status: Married    Spouse name: Not on file   Number of children: Not on file   Years of education: Not on file   Highest education level: Not on  file  Occupational History   Occupation: retired     Comment: Dentist   Tobacco Use   Smoking status: Never   Smokeless tobacco: Never  Vaping Use   Vaping status: Never Used  Substance and Sexual Activity   Alcohol use: Yes    Comment: "few times a week"   Drug use: No   Sexual activity: Not on file  Other Topics Concern   Not on file  Social History Narrative   Not on file   Social Determinants of Health   Financial Resource Strain: Not on file  Food Insecurity: Not on file  Transportation Needs: Not on file  Physical Activity: Not on file  Stress: Not on file  Social Connections: Not on file     Family History: The patient's family history includes Clotting disorder in his brother, father, grandchild, and son.  ROS:   Please see the history of present illness.     All other systems reviewed and are negative.  EKGs/Labs/Other Studies Reviewed:    The following studies were reviewed today:  Intraoperative TEE 10/18/2019: - Left Ventricle: The left ventricle is essentially unchanged from  pre-bypass.  - Right Ventricle: The right ventricle appears unchanged from pre-bypass.  - Aorta: The aorta appears unchanged from pre-bypass.  - Left Atrium: The left atrium appears unchanged from pre-bypass.  - Left Atrial Appendage: The left atrial appendage appears unchanged from  pre-bypass.  - Aortic Valve: The aortic valve appears unchanged from pre-bypass.  - Mitral Valve: The mitral valve appears essentially unchanged from  pre-bypass.  - Tricuspid Valve: The tricuspid valve appears essentially unchanged from  pre-bypass.  - Interatrial Septum: The interatrial septum appears unchanged from  pre-bypass.  - Interventricular Septum: The interventricular septum appears unchanged  from  pre-bypass.  - Pericardium: The pericardium appears unchanged from pre-bypass.   US Doppler Pre CABG 10/17/2019: Right Carotid: Velocities in the right ICA are consistent with a  1-39%  stenosis.  Left Carotid: Velocities in the left ICA are consistent with a 1-39%  stenosis.  Vertebrals:  Bilateral vertebral arteries demonstrate antegrade flow.  Subclavians: Normal flow hemodynamics were seen in bilateral subclavian arteries.  Right Upper Extremity: Doppler waveforms remain within normal limits with right radial compression. Doppler waveforms remain within normal limits with right ulnar compression.  Left Upper Extremity: Doppler waveforms remain within normal limits with left radial compression. Doppler waveforms remain within normal limits with left ulnar compression.   Echo 10/16/2019:  1. Left ventricular ejection fraction, by estimation, is 55 to 60%. The  left ventricle has normal function. The left ventricle has no regional  wall motion abnormalities. There is mild left ventricular hypertrophy of  the basal-septal segment. Left  ventricular diastolic parameters are consistent with Grade I diastolic  dysfunction (impaired relaxation).   2. Right ventricular systolic function is normal. The right ventricular  size is normal. Tricuspid  regurgitation signal is inadequate for assessing  PA pressure.   3. Left atrial size was mildly dilated.   4. The mitral valve is normal in structure. No evidence of mitral valve  regurgitation. No evidence of mitral stenosis.   5. The aortic valve is normal in structure. Aortic valve regurgitation is  not visualized. No aortic stenosis is present.   6. Aortic dilatation noted. There is borderline dilatation of the aortic  root measuring 38 mm.   7. The inferior vena cava is normal in size with greater than 50%  respiratory variability, suggesting right atrial pressure of 3 mmHg.   Comparison(s): No significant change from prior study. Prior images  reviewed side by side.   LHC 10/16/2019: -- LEFT VENTRICULAR HEMODYNAMICS & VENTRICULOGRAPHY There is mild left ventricular systolic dysfunction. LV end diastolic pressure is  normal. The left ventricular ejection fraction is 45-50% by visual estimate. -- CORONARY ANGIOGRAPHY Prox LAD to Mid LAD lesion is 99% stenosed with 50% stenosed side branch in 1st Diag. Prox Cx lesion is 90% stenosed. Dist RCA lesion is 80% stenosed with 90% stenosed side branch in RPDA. SUMMARY  Severe heavily calcified multivessel disease: Heavily calcified proximal LAD subtotal occlusion (99%) lesion beginning just prior to major SP trunk, terminating after 1st Diag --> remainder the LAD is relatively free of disease with TIMI I flow and competitive flow from right left collaterals to the apex Heavily calcified very focal 90% eccentric stenosis in the proximal LCx--LCx gives off 2 major Mrg branches and a posterior AV groove branch with 2 small PL branches--free of disease. Heavily calcified dominant RCA with diffuse 30% mid to distal disease, and 80% focal stenosis just prior to RPAV continuing into the RPDA with 90% ostial PDA lesion. Mildly reduced LVEF of roughly 45% with mid to apical anterior hypokinesis RECOMMENDATION Admit under Dr. Campbell Lerner service for medication optimization and CVTS consultation He will be started on IV Heparin while we continue to hold his Xarelto. Increase to Max dose Atorvastatin & adjust BP medications    Lexiscan Stress Test 10/04/2019: Nuclear stress EF: 49%. The left ventricular ejection fraction is mildly decreased (45-54%). Horizontal ST segment depression ST segment depression of 1.5 mm was noted during stress in the II, III, aVF, V5 and V6 leads, beginning at 7 minutes of stress, and returning to baseline after less than 1 minute of recovery. Findings consistent with ischemia. This is a high risk study.   1. Initial ECG treadmill stress did not reach target HR and was converted to pharmacological study. Despite not reaching target HR, the patient had 1.5 mm horizontal ST depression in inferior leads (II, III, AVF) and anterolateral leads (V5-6)  and symptoms of chest pain. 2. On MPI imaging, there is a medium size (10-12% of the LV), moderate perfusion defect present on stress imaging in the apical anterior, and apex consistent with ischemia in the LAD distrubution.  3. LVEF is normal for this modality, 49%.  4. Average exercise capacity (8:11 min:s; 7.0 METS). 5. Overall, this is a high risk study concerning for ischemia.  EKG:   10/14/22: Normal sinus rhythm, rate 62, no ST abnormalities 02/20/21: NSR, rate 81, no ST abnormalities 08/18/2020: NSR, rate 57, No ST abnormalities 11/16/2019: normal sinus rhythm, rate 63, no ST abnormalities 10/09/2019: NSR, rate 72, no ST abnormalities  Recent Labs: No results found for requested labs within last 365 days.  Recent Lipid Panel    Component Value Date/Time   CHOL 114 01/18/2020 0845  TRIG 74 01/18/2020 0845   HDL 48 01/18/2020 0845   CHOLHDL 2.4 01/18/2020 0845   CHOLHDL 3.6 10/18/2019 0511   VLDL 24 10/18/2019 0511   LDLCALC 51 01/18/2020 0845    Physical Exam:    VS:  BP 122/74 (BP Location: Left Arm, Patient Position: Sitting, Cuff Size: Normal)   Pulse 62   Ht 5\' 10"  (1.778 m)   Wt 178 lb (80.7 kg)   SpO2 91%   BMI 25.54 kg/m     Wt Readings from Last 3 Encounters:  10/14/22 178 lb (80.7 kg)  02/10/22 192 lb 12.8 oz (87.5 kg)  08/11/21 192 lb 9.6 oz (87.4 kg)     GEN: in no acute distress HEENT: Normal NECK: No JVD; No carotid bruits CARDIAC: RRR, no murmurs, rubs, gallops RESPIRATORY:  Clear to auscultation without rales, wheezing or rhonchi  ABDOMEN: Soft, non-tender, non-distended MUSCULOSKELETAL:  No edema SKIN: Warm and dry NEUROLOGIC:  Alert and oriented x 3 PSYCHIATRIC:  Normal affect   ASSESSMENT:    1. CAD in native artery   2. Essential hypertension   3. Hyperlipidemia, unspecified hyperlipidemia type      PLAN:    CAD: Cardiac catheterization on 10/16/2019 showed severe heavily calcified multivessel disease (99% proximal LAD, 90% proximal  circumflex, 80% distal RCA, 90% RPDA).  Underwent CABG 10/18/19 (LIMA-LAD, SVG-OM, SVG sequential to PDA and PLB).  Echocardiogram on 10/16/2019 showed normal biventricular function, no significant valvular disease. -Continue Xarelto  -Continue atorvastatin 80 mg daily -Continue Toprol-XL 25 mg daily  Hypertension: continue losartan 50 mg daily and Toprol-XL 25 mg daily.  Appears controlled.  Hyperlipidemia: On atorvastatin 80 mg daily, LDL 39 on 06/04/22  Factor V Leiden: History of DVT and PE.  On Xarelto    Prediabetes: A1c 5.9%.  RTC in 6 months   Medication Adjustments/Labs and Tests Ordered: Current medicines are reviewed at length with the patient today.  Concerns regarding medicines are outlined above.  Orders Placed This Encounter  Procedures   EKG 12-Lead    No orders of the defined types were placed in this encounter.    Patient Instructions  Medication Instructions:  Continue same medications *If you need a refill on your cardiac medications before your next appointment, please call your pharmacy*   Lab Work: None ordered   Testing/Procedures: None ordered   Follow-Up: At [facility], you and your health needs are our priority.  As part of our continuing mission to provide you with exceptional heart care, we have created designated Provider Care Teams.  These Care Teams include your primary Cardiologist (physician) and Advanced Practice Providers (APPs -  Physician Assistants and Nurse Practitioners) who all work together to provide you with the care you need, when you need it.  We recommend signing up for the patient portal called "MyChart".  Sign up information is provided on this After Visit Summary.  MyChart is used to connect with patients for Virtual Visits (Telemedicine).  Patients are able to view lab/test results, encounter notes, upcoming appointments, etc.  Non-urgent messages can be sent to your provider as well.   To learn more about what you  can do with MyChart, go to [email or web address].    Your next appointment:  6 months    Call in Oct to schedule Feb appointment     Provider:  Dr.Satara Virella       Signed, Little Ishikawa, MD  10/14/2022 9:31 AM    [facility]

## 2022-11-03 ENCOUNTER — Ambulatory Visit: Payer: Medicare Other | Admitting: Cardiology

## 2022-12-07 DIAGNOSIS — E7800 Pure hypercholesterolemia, unspecified: Secondary | ICD-10-CM | POA: Diagnosis not present

## 2022-12-07 DIAGNOSIS — N400 Enlarged prostate without lower urinary tract symptoms: Secondary | ICD-10-CM | POA: Diagnosis not present

## 2022-12-07 DIAGNOSIS — R7303 Prediabetes: Secondary | ICD-10-CM | POA: Diagnosis not present

## 2022-12-07 DIAGNOSIS — I2510 Atherosclerotic heart disease of native coronary artery without angina pectoris: Secondary | ICD-10-CM | POA: Diagnosis not present

## 2022-12-07 DIAGNOSIS — I10 Essential (primary) hypertension: Secondary | ICD-10-CM | POA: Diagnosis not present

## 2022-12-07 DIAGNOSIS — R972 Elevated prostate specific antigen [PSA]: Secondary | ICD-10-CM | POA: Diagnosis not present

## 2022-12-07 DIAGNOSIS — Z86711 Personal history of pulmonary embolism: Secondary | ICD-10-CM | POA: Diagnosis not present

## 2022-12-07 DIAGNOSIS — Z23 Encounter for immunization: Secondary | ICD-10-CM | POA: Diagnosis not present

## 2022-12-07 DIAGNOSIS — Z951 Presence of aortocoronary bypass graft: Secondary | ICD-10-CM | POA: Diagnosis not present

## 2022-12-27 DIAGNOSIS — Z09 Encounter for follow-up examination after completed treatment for conditions other than malignant neoplasm: Secondary | ICD-10-CM | POA: Diagnosis not present

## 2022-12-27 DIAGNOSIS — L821 Other seborrheic keratosis: Secondary | ICD-10-CM | POA: Diagnosis not present

## 2022-12-27 DIAGNOSIS — Z872 Personal history of diseases of the skin and subcutaneous tissue: Secondary | ICD-10-CM | POA: Diagnosis not present

## 2022-12-27 DIAGNOSIS — D1801 Hemangioma of skin and subcutaneous tissue: Secondary | ICD-10-CM | POA: Diagnosis not present

## 2022-12-27 DIAGNOSIS — Z7189 Other specified counseling: Secondary | ICD-10-CM | POA: Diagnosis not present

## 2022-12-27 DIAGNOSIS — L910 Hypertrophic scar: Secondary | ICD-10-CM | POA: Diagnosis not present

## 2022-12-27 DIAGNOSIS — L814 Other melanin hyperpigmentation: Secondary | ICD-10-CM | POA: Diagnosis not present

## 2023-03-13 ENCOUNTER — Encounter: Payer: Self-pay | Admitting: Cardiology

## 2023-03-14 NOTE — Telephone Encounter (Signed)
Spoke to pt who states that he has been having this issue for about one year, but it is getting worse and more frequent.  He is going to see his PCP next week, and thinks it may be a GI problem.  He sent [place] the [facility] message so that Dr. Bjorn Pippin can be informed of this.  Will fwd msg to him and nurse.

## 2023-03-22 NOTE — Telephone Encounter (Signed)
 Called and patient apppt made for 1/13 @10 :00. Patient verbalizes an understanding.

## 2023-03-22 NOTE — Telephone Encounter (Signed)
 Can we add him to my schedule at 10am on 1/13?

## 2023-03-27 NOTE — Progress Notes (Signed)
 Cardiology Office Note:    Date:  03/28/2023   ID:  Jose Bowers, DOB 1951-01-08, MRN [id number]  PCP:  Ransom Other, MD  Cardiologist:  Lonni LITTIE Nanas, MD  Electrophysiologist:  None   Referring MD: Ransom Other, MD   Chief Complaint  Patient presents with   Coronary Artery Disease     History of Present Illness:    Jose Bowers is a 73 y.o. male with a hx of CAD status post CABG (LIMA-LAD, SVG-OM, SVG sequential to PDA and PLB) factor V Leiden, hypertension, hyperlipidemia, DVT following shoulder surgery in 2014, saddle PE treated with EKOS in 2019 who presents for follow-up.  He was referred by Dr. Husain for evaluation of chest pain, initially seen on 09/19/2019.  Exercise Myoview  was attempted on 10/04/2019.  He did not reach target heart rate on treadmill and was converted to pharmacologic study.  Despite not reaching target heart rate, he had 1.5 mm horizontal ST depressions in inferior and anterolateral leads and symptoms of chest pain.  MPI imaging showed reversible defect in apical anterior wall and apex consistent with LAD ischemia.  LVEF 49%.  Cardiac catheterization on 10/16/2019 showed severe heavily calcified multivessel disease (99% proximal LAD, 90% proximal circumflex, 80% distal RCA, 90% RPDA).  Underwent CABG 10/18/19 ((LIMA-LAD, SVG-OM, SVG sequential to PDA and PLB).  Echocardiogram 10/16/2019 showed normal biventricular function, no significant valvular disease.  Since last clinic visit, he reports he is doing okay.  Reports having pain in epigastric area/lower chest if exercising after eating.  States that even if he tries walking a few blocks after eating he has discomfort in this region.  Wt Readings from Last 3 Encounters:  03/28/23 198 lb 3.2 oz (89.9 kg)  10/14/22 178 lb (80.7 kg)  02/10/22 192 lb 12.8 oz (87.5 kg)     Past Medical History:  Diagnosis Date   Factor V Leiden (HCC)    Hypertension     Past Surgical History:  Procedure  Laterality Date   CORONARY ARTERY BYPASS GRAFT N/A 10/18/2019   Procedure: CORONARY ARTERY BYPASS GRAFTING (CABG), ON PUMP, TIMES FOUR, USING LEFT INTERAL MAMMARY ATERY AND ENDOSCOPICALLY HARVESTED RIGHT GREATER SAPHENOUS VEIN;  Surgeon: Lucas Dorise POUR, MD;  Location: [facility] OR;  Service: Open Heart Surgery;  Laterality: N/A;   HERNIA REPAIR     IR ANGIOGRAM PULMONARY BILATERAL SELECTIVE  03/19/2017   IR ANGIOGRAM SELECTIVE EACH ADDITIONAL VESSEL  03/19/2017   IR ANGIOGRAM SELECTIVE EACH ADDITIONAL VESSEL  03/19/2017   IR INFUSION THROMBOL ARTERIAL INITIAL (MS)  03/19/2017   IR INFUSION THROMBOL ARTERIAL INITIAL (MS)  03/19/2017   IR THROMB F/U EVAL ART/VEN FINAL DAY (MS)  03/20/2017   IR US  GUIDE VASC ACCESS RIGHT  03/19/2017   JOINT REPLACEMENT     LEFT HEART CATH AND CORONARY ANGIOGRAPHY N/A 10/16/2019   Procedure: LEFT HEART CATH AND CORONARY ANGIOGRAPHY;  Surgeon: Anner Alm ORN, MD;  Location: [facility] INVASIVE CV LAB;  Service: Cardiovascular;  Laterality: N/A;   ROTATOR CUFF REPAIR     TEE WITHOUT CARDIOVERSION N/A 10/18/2019   Procedure: TRANSESOPHAGEAL ECHOCARDIOGRAM (TEE);  Surgeon: Lucas Dorise POUR, MD;  Location: [facility] OR;  Service: Open Heart Surgery;  Laterality: N/A;    Current Medications: Current Meds  Medication Sig   atorvastatin  (LIPITOR ) 80 MG tablet TAKE 1 TABLET BY MOUTH DAILY AT 6 PM.   metoprolol  succinate (TOPROL -XL) 25 MG 24 hr tablet TAKE 1 TABLET (25 MG TOTAL) BY MOUTH DAILY.   omeprazole  (PRILOSEC) 20  MG capsule Take 1 capsule (20 mg total) by mouth daily.   XARELTO  20 MG TABS tablet Take 20 mg by mouth daily.     Allergies:   Patient has no known allergies.   Social History   Socioeconomic History   Marital status: Married    Spouse name: Not on file   Number of children: Not on file   Years of education: Not on file   Highest education level: Not on file  Occupational History   Occupation: retired     Comment: dentist   Tobacco Use   Smoking status: Never    Smokeless tobacco: Never  Vaping Use   Vaping status: Never Used  Substance and Sexual Activity   Alcohol use: Yes    Comment: few times a week   Drug use: No   Sexual activity: Not on file  Other Topics Concern   Not on file  Social History Narrative   Not on file   Social Drivers of Health   Financial Resource Strain: Not on file  Food Insecurity: Not on file  Transportation Needs: Not on file  Physical Activity: Not on file  Stress: Not on file  Social Connections: Not on file     Family History: The patient's family history includes Clotting disorder in his brother, father, grandchild, and son.  ROS:   Please see the history of present illness.     All other systems reviewed and are negative.  EKGs/Labs/Other Studies Reviewed:    The following studies were reviewed today:  Intraoperative TEE 10/18/2019: - Left Ventricle: The left ventricle is essentially unchanged from  pre-bypass.  - Right Ventricle: The right ventricle appears unchanged from pre-bypass.  - Aorta: The aorta appears unchanged from pre-bypass.  - Left Atrium: The left atrium appears unchanged from pre-bypass.  - Left Atrial Appendage: The left atrial appendage appears unchanged from  pre-bypass.  - Aortic Valve: The aortic valve appears unchanged from pre-bypass.  - Mitral Valve: The mitral valve appears essentially unchanged from  pre-bypass.  - Tricuspid Valve: The tricuspid valve appears essentially unchanged from  pre-bypass.  - Interatrial Septum: The interatrial septum appears unchanged from  pre-bypass.  - Interventricular Septum: The interventricular septum appears unchanged  from  pre-bypass.  - Pericardium: The pericardium appears unchanged from pre-bypass.   US  Doppler Pre CABG 10/17/2019: Right Carotid: Velocities in the right ICA are consistent with a 1-39%  stenosis.  Left Carotid: Velocities in the left ICA are consistent with a 1-39%  stenosis.  Vertebrals:  Bilateral  vertebral arteries demonstrate antegrade flow.  Subclavians: Normal flow hemodynamics were seen in bilateral subclavian arteries.  Right Upper Extremity: Doppler waveforms remain within normal limits with right radial compression. Doppler waveforms remain within normal limits with right ulnar compression.  Left Upper Extremity: Doppler waveforms remain within normal limits with left radial compression. Doppler waveforms remain within normal limits with left ulnar compression.   Echo 10/16/2019:  1. Left ventricular ejection fraction, by estimation, is 55 to 60%. The  left ventricle has normal function. The left ventricle has no regional  wall motion abnormalities. There is mild left ventricular hypertrophy of  the basal-septal segment. Left  ventricular diastolic parameters are consistent with Grade I diastolic  dysfunction (impaired relaxation).   2. Right ventricular systolic function is normal. The right ventricular  size is normal. Tricuspid regurgitation signal is inadequate for assessing  PA pressure.   3. Left atrial size was mildly dilated.   4. The  mitral valve is normal in structure. No evidence of mitral valve  regurgitation. No evidence of mitral stenosis.   5. The aortic valve is normal in structure. Aortic valve regurgitation is  not visualized. No aortic stenosis is present.   6. Aortic dilatation noted. There is borderline dilatation of the aortic  root measuring 38 mm.   7. The inferior vena cava is normal in size with greater than 50%  respiratory variability, suggesting right atrial pressure of 3 mmHg.   Comparison(s): No significant change from prior study. Prior images  reviewed side by side.   LHC 10/16/2019: -- LEFT VENTRICULAR HEMODYNAMICS & VENTRICULOGRAPHY There is mild left ventricular systolic dysfunction. LV end diastolic pressure is normal. The left ventricular ejection fraction is 45-50% by visual estimate. -- CORONARY ANGIOGRAPHY Prox LAD to Mid LAD  lesion is 99% stenosed with 50% stenosed side branch in 1st Diag. Prox Cx lesion is 90% stenosed. Dist RCA lesion is 80% stenosed with 90% stenosed side branch in RPDA. SUMMARY  Severe heavily calcified multivessel disease: Heavily calcified proximal LAD subtotal occlusion (99%) lesion beginning just prior to major SP trunk, terminating after 1st Diag --> remainder the LAD is relatively free of disease with TIMI I flow and competitive flow from right left collaterals to the apex Heavily calcified very focal 90% eccentric stenosis in the proximal LCx--LCx gives off 2 major Mrg branches and a posterior AV groove branch with 2 small PL branches--free of disease. Heavily calcified dominant RCA with diffuse 30% mid to distal disease, and 80% focal stenosis just prior to RPAV continuing into the RPDA with 90% ostial PDA lesion. Mildly reduced LVEF of roughly 45% with mid to apical anterior hypokinesis RECOMMENDATION Admit under Dr. Alvan service for medication optimization and CVTS consultation He will be started on IV Heparin  while we continue to hold his Xarelto . Increase to Max dose Atorvastatin  & adjust BP medications    Lexiscan  Stress Test 10/04/2019: Nuclear stress EF: 49%. The left ventricular ejection fraction is mildly decreased (45-54%). Horizontal ST segment depression ST segment depression of 1.5 mm was noted during stress in the II, III, aVF, V5 and V6 leads, beginning at 7 minutes of stress, and returning to baseline after less than 1 minute of recovery. Findings consistent with ischemia. This is a high risk study.   1. Initial ECG treadmill stress did not reach target HR and was converted to pharmacological study. Despite not reaching target HR, the patient had 1.5 mm horizontal ST depression in inferior leads (II, III, AVF) and anterolateral leads (V5-6) and symptoms of chest pain. 2. On MPI imaging, there is a medium size (10-12% of the LV), moderate perfusion defect present  on stress imaging in the apical anterior, and apex consistent with ischemia in the LAD distrubution.  3. LVEF is normal for this modality, 49%.  4. Average exercise capacity (8:11 min:s; 7.0 METS). 5. Overall, this is a high risk study concerning for ischemia.  EKG:   03/28/2023: Normal sinus rhythm, rate 62 10/14/22: Normal sinus rhythm, rate 62, no ST abnormalities 02/20/21: NSR, rate 81, no ST abnormalities 08/18/2020: NSR, rate 57, No ST abnormalities 11/16/2019: normal sinus rhythm, rate 63, no ST abnormalities 10/09/2019: NSR, rate 72, no ST abnormalities  Recent Labs: No results found for requested labs within last 365 days.  Recent Lipid Panel    Component Value Date/Time   CHOL 114 01/18/2020 0845   TRIG 74 01/18/2020 0845   HDL 48 01/18/2020 0845   CHOLHDL 2.4 01/18/2020  0845   CHOLHDL 3.6 10/18/2019 0511   VLDL 24 10/18/2019 0511   LDLCALC 51 01/18/2020 0845    Physical Exam:    VS:  BP 128/76 (BP Location: Left Arm, Patient Position: Sitting)   Pulse 75   Ht 5' 10 (1.778 m)   Wt 198 lb 3.2 oz (89.9 kg)   SpO2 98%   BMI 28.44 kg/m     Wt Readings from Last 3 Encounters:  03/28/23 198 lb 3.2 oz (89.9 kg)  10/14/22 178 lb (80.7 kg)  02/10/22 192 lb 12.8 oz (87.5 kg)     GEN: in no acute distress HEENT: Normal NECK: No JVD; No carotid bruits CARDIAC: RRR, no murmurs, rubs, gallops RESPIRATORY:  Clear to auscultation without rales, wheezing or rhonchi  ABDOMEN: Soft, non-tender, non-distended MUSCULOSKELETAL:  No edema SKIN: Warm and dry NEUROLOGIC:  Alert and oriented x 3 PSYCHIATRIC:  Normal affect   ASSESSMENT:    1. Chest pain, unspecified type   2. CAD in native artery   3. Essential hypertension   4. Hyperlipidemia, unspecified hyperlipidemia type       PLAN:    CAD: Cardiac catheterization on 10/16/2019 showed severe heavily calcified multivessel disease (99% proximal LAD, 90% proximal circumflex, 80% distal RCA, 90% RPDA).  Underwent CABG 10/18/19  (LIMA-LAD, SVG-OM, SVG sequential to PDA and PLB).  Echocardiogram on 10/16/2019 showed normal biventricular function, no significant valvular disease. -Continue Xarelto   -Continue atorvastatin  80 mg daily -Continue Toprol -XL 25 mg daily -He is reporting atypical chest pain, reports exertional lower chest/upper abdomen pain but only after eating.  Given his history, recommend evaluation with cardiac PET to rule out ischemia.  Given relationship with eating, will also start omeprazole  20 mg daily as may be component of GERD  Hypertension: continue losartan  50 mg daily and Toprol -XL 25 mg daily.  Appears controlled.  Hyperlipidemia: On atorvastatin  80 mg daily, LDL 39 on 06/04/22  Factor V Leiden: History of DVT and PE.  On Xarelto     Prediabetes: A1c 5.9% on 06/04/2022  RTC in 3 months  Informed Consent   Shared Decision Making/Informed Consent The risks [chest pain, shortness of breath, cardiac arrhythmias, dizziness, blood pressure fluctuations, myocardial infarction, stroke/transient ischemic attack, nausea, vomiting, allergic reaction, radiation exposure, metallic taste sensation and life-threatening complications (estimated to be 1 in 10,000)], benefits (risk stratification, diagnosing coronary artery disease, treatment guidance) and alternatives of a cardiac PET stress test were discussed in detail with Jose Bowers and he agrees to proceed.      Medication Adjustments/Labs and Tests Ordered: Current medicines are reviewed at length with the patient today.  Concerns regarding medicines are outlined above.  Orders Placed This Encounter  Procedures   NM PET CT CARDIAC PERFUSION MULTI W/ABSOLUTE BLOODFLOW   EKG 12-Lead   EKG 12-Lead    Meds ordered this encounter  Medications   omeprazole  (PRILOSEC) 20 MG capsule    Sig: Take 1 capsule (20 mg total) by mouth daily.    Dispense:  90 capsule    Refill:  3     Patient Instructions  Medication Instructions:  Start Prilosec 20mg   daily *If you need a refill on your cardiac medications before your next appointment, please call your pharmacy*  Lab Work: none  Testing/Procedures:    Please report to Radiology at the [facility] Main Entrance 30 minutes early for your test.  [address] [place], [place] [address]  How to Prepare for Your Cardiac PET/CT Stress Test:  Nothing to eat or drink, except water , 3 hours prior to arrival time.  NO caffeine/decaffeinated products, or chocolate 12 hours prior to arrival. (Please note decaffeinated beverages (teas/coffees) still contain caffeine).  If you have caffeine within 12 hours prior, the test will need to be rescheduled.  Medication instructions: Do not take erectile dysfunction medications for 72 hours prior to test (sildenafil, tadalafil) Do not take nitrates (isosorbide mononitrate, Ranexa) the day before or day of test Do not take tamsulosin the day before or morning of test Hold theophylline containing medications for 12 hours. Hold Dipyridamole 48 hours prior to the test.  Diabetic Preparation: If able to eat breakfast prior to 3 hour fasting, you may take all medications, including your insulin . Do not worry if you miss your breakfast dose of insulin  - start at your next meal. If you do not eat prior to 3 hour fast-Hold all diabetes (oral and insulin ) medications. Patients who wear a continuous glucose monitor MUST remove the device prior to scanning.  You may take your remaining medications with water .  NO perfume, cologne or lotion on chest or abdomen area. FEMALES - Please avoid wearing dresses to this appointment.  Total time is 1 to 2 hours; you may want to bring reading material for the waiting time.  IF YOU THINK YOU MAY BE PREGNANT, OR ARE NURSING PLEASE INFORM THE TECHNOLOGIST.  In preparation for your appointment, medication and supplies will be purchased.  Appointment availability is limited, so if you need to cancel or  reschedule, please call the Radiology Department at [phone] [facility]) OR [phone] [facility]) 24 hours in advance to avoid a cancellation fee of $100.00  What to Expect When you Arrive:  Once you arrive and check in for your appointment, you will be taken to a preparation room within the Radiology Department.  A technologist or Nurse will obtain your medical history, verify that you are correctly prepped for the exam, and explain the procedure.  Afterwards, an IV will be started in your arm and electrodes will be placed on your skin for EKG monitoring during the stress portion of the exam. Then you will be escorted to the PET/CT scanner.  There, staff will get you positioned on the scanner and obtain a blood pressure and EKG.  During the exam, you will continue to be connected to the EKG and blood pressure machines.  A small, safe amount of a radioactive tracer will be injected in your IV to obtain a series of pictures of your heart along with an injection of a stress agent.    After your Exam:  It is recommended that you eat a meal and drink a caffeinated beverage to counter act any effects of the stress agent.  Drink plenty of fluids for the remainder of the day and urinate frequently for the first couple of hours after the exam.  Your doctor will inform you of your test results within 7-10 business days.  For more information and frequently asked questions, please visit our website: [email or web address]  For questions about your test or how to prepare for your test, please call: Cardiac Imaging Nurse Navigators Office: [phone]  Follow-Up: At [facility], you and your health needs are our priority.  As part of our continuing mission to provide you with exceptional heart care, we have created designated Provider Care Teams.  These Care Teams include your primary Cardiologist (physician) and Advanced Practice Providers (APPs -  Physician Assistants and Nurse  Practitioners) who all work  together to provide you with the care you need, when you need it.  We recommend signing up for the patient portal called MyChart.  Sign up information is provided on this After Visit Summary.  MyChart is used to connect with patients for Virtual Visits (Telemedicine).  Patients are able to view lab/test results, encounter notes, upcoming appointments, etc.  Non-urgent messages can be sent to your provider as well.   To learn more about what you can do with MyChart, go to [email or web address].    Your next appointment:   Keep your next schedle appt.  Provider:   Lonni LITTIE Nanas, MD     Other Instructions       Signed, Lonni LITTIE Nanas, MD  03/28/2023 1:19 PM    [facility]

## 2023-03-28 ENCOUNTER — Ambulatory Visit: Payer: Medicare Other | Attending: Cardiology | Admitting: Cardiology

## 2023-03-28 ENCOUNTER — Encounter: Payer: Self-pay | Admitting: Cardiology

## 2023-03-28 VITALS — BP 128/76 | HR 75 | Ht 70.0 in | Wt 198.2 lb

## 2023-03-28 DIAGNOSIS — I10 Essential (primary) hypertension: Secondary | ICD-10-CM | POA: Insufficient documentation

## 2023-03-28 DIAGNOSIS — E785 Hyperlipidemia, unspecified: Secondary | ICD-10-CM | POA: Insufficient documentation

## 2023-03-28 DIAGNOSIS — I2510 Atherosclerotic heart disease of native coronary artery without angina pectoris: Secondary | ICD-10-CM | POA: Diagnosis not present

## 2023-03-28 DIAGNOSIS — R079 Chest pain, unspecified: Secondary | ICD-10-CM | POA: Diagnosis not present

## 2023-03-28 MED ORDER — OMEPRAZOLE 20 MG PO CPDR: 20.0000 mg | DELAYED_RELEASE_CAPSULE | Freq: Every day | ORAL | 3 refills | Status: DC

## 2023-03-28 NOTE — Patient Instructions (Addendum)
 Medication Instructions:  Start Prilosec 20mg  daily *If you need a refill on your cardiac medications before your next appointment, please call your pharmacy*  Lab Work: none  Testing/Procedures:    Please report to Radiology at the [facility] Main Entrance 30 minutes early for your test.  [address] [place], [place] [address]  How to Prepare for Your Cardiac PET/CT Stress Test:  Nothing to eat or drink, except water , 3 hours prior to arrival time.  NO caffeine/decaffeinated products, or chocolate 12 hours prior to arrival. (Please note decaffeinated beverages (teas/coffees) still contain caffeine).  If you have caffeine within 12 hours prior, the test will need to be rescheduled.  Medication instructions: Do not take erectile dysfunction medications for 72 hours prior to test (sildenafil, tadalafil) Do not take nitrates (isosorbide mononitrate, Ranexa) the day before or day of test Do not take tamsulosin the day before or morning of test Hold theophylline containing medications for 12 hours. Hold Dipyridamole 48 hours prior to the test.  Diabetic Preparation: If able to eat breakfast prior to 3 hour fasting, you may take all medications, including your insulin . Do not worry if you miss your breakfast dose of insulin  - start at your next meal. If you do not eat prior to 3 hour fast-Hold all diabetes (oral and insulin ) medications. Patients who wear a continuous glucose monitor MUST remove the device prior to scanning.  You may take your remaining medications with water .  NO perfume, cologne or lotion on chest or abdomen area. FEMALES - Please avoid wearing dresses to this appointment.  Total time is 1 to 2 hours; you may want to bring reading material for the waiting time.  IF YOU THINK YOU MAY BE PREGNANT, OR ARE NURSING PLEASE INFORM THE TECHNOLOGIST.  In preparation for your appointment, medication and supplies will be purchased.  Appointment availability  is limited, so if you need to cancel or reschedule, please call the Radiology Department at [phone] [facility] Law) OR [phone] [facility]) 24 hours in advance to avoid a cancellation fee of $100.00  What to Expect When you Arrive:  Once you arrive and check in for your appointment, you will be taken to a preparation room within the Radiology Department.  A technologist or Nurse will obtain your medical history, verify that you are correctly prepped for the exam, and explain the procedure.  Afterwards, an IV will be started in your arm and electrodes will be placed on your skin for EKG monitoring during the stress portion of the exam. Then you will be escorted to the PET/CT scanner.  There, staff will get you positioned on the scanner and obtain a blood pressure and EKG.  During the exam, you will continue to be connected to the EKG and blood pressure machines.  A small, safe amount of a radioactive tracer will be injected in your IV to obtain a series of pictures of your heart along with an injection of a stress agent.    After your Exam:  It is recommended that you eat a meal and drink a caffeinated beverage to counter act any effects of the stress agent.  Drink plenty of fluids for the remainder of the day and urinate frequently for the first couple of hours after the exam.  Your doctor will inform you of your test results within 7-10 business days.  For more information and frequently asked questions, please visit our website: [email or web address]  For questions about your test or how to prepare for  your test, please call: Cardiac Imaging Nurse Navigators Office: [phone]  Follow-Up: At [facility], you and your health needs are our priority.  As part of our continuing mission to provide you with exceptional heart care, we have created designated Provider Care Teams.  These Care Teams include your primary Cardiologist (physician) and Advanced Practice Providers  (APPs -  Physician Assistants and Nurse Practitioners) who all work together to provide you with the care you need, when you need it.  We recommend signing up for the patient portal called MyChart.  Sign up information is provided on this After Visit Summary.  MyChart is used to connect with patients for Virtual Visits (Telemedicine).  Patients are able to view lab/test results, encounter notes, upcoming appointments, etc.  Non-urgent messages can be sent to your provider as well.   To learn more about what you can do with MyChart, go to [email or web address].    Your next appointment:   Keep your next schedle appt.  Provider:   Lonni LITTIE Nanas, MD     Other Instructions

## 2023-03-30 ENCOUNTER — Other Ambulatory Visit: Payer: Self-pay | Admitting: Internal Medicine

## 2023-03-30 DIAGNOSIS — R1013 Epigastric pain: Secondary | ICD-10-CM

## 2023-03-30 DIAGNOSIS — I10 Essential (primary) hypertension: Secondary | ICD-10-CM | POA: Diagnosis not present

## 2023-03-30 DIAGNOSIS — I2510 Atherosclerotic heart disease of native coronary artery without angina pectoris: Secondary | ICD-10-CM | POA: Diagnosis not present

## 2023-04-13 DIAGNOSIS — M25512 Pain in left shoulder: Secondary | ICD-10-CM | POA: Diagnosis not present

## 2023-04-13 DIAGNOSIS — M79644 Pain in right finger(s): Secondary | ICD-10-CM | POA: Diagnosis not present

## 2023-04-15 DIAGNOSIS — S46012A Strain of muscle(s) and tendon(s) of the rotator cuff of left shoulder, initial encounter: Secondary | ICD-10-CM | POA: Diagnosis not present

## 2023-04-15 DIAGNOSIS — M79644 Pain in right finger(s): Secondary | ICD-10-CM | POA: Diagnosis not present

## 2023-04-19 ENCOUNTER — Ambulatory Visit
Admission: RE | Admit: 2023-04-19 | Discharge: 2023-04-19 | Disposition: A | Discharge status: Home / Self Care | Payer: Medicare Other | Source: Ambulatory Visit | Attending: Internal Medicine | Admitting: Internal Medicine

## 2023-04-19 DIAGNOSIS — R1013 Epigastric pain: Secondary | ICD-10-CM

## 2023-04-19 DIAGNOSIS — K224 Dyskinesia of esophagus: Secondary | ICD-10-CM | POA: Diagnosis not present

## 2023-04-21 DIAGNOSIS — M25512 Pain in left shoulder: Secondary | ICD-10-CM | POA: Diagnosis not present

## 2023-04-25 ENCOUNTER — Telehealth: Payer: Self-pay | Admitting: Cardiology

## 2023-04-25 DIAGNOSIS — S46012D Strain of muscle(s) and tendon(s) of the rotator cuff of left shoulder, subsequent encounter: Secondary | ICD-10-CM | POA: Diagnosis not present

## 2023-04-25 NOTE — Telephone Encounter (Signed)
   Pre-operative Risk Assessment    Patient Name: Jose Bowers  DOB: 29-May-1950 MRN: [id number]   Date of last office visit: 03-28-2023 Date of next office visit: 05-30-2023   Request for Surgical Clearance    Procedure:   left shoulder scope,rotator cuff repair  Date of Surgery:  Clearance TBD                                Surgeon:  Dr. Osa Blase Surgeon's Group or Practice Name:  [facility] Phone number:  [phone] Mark Sil Fax number:  [phone]   Type of Clearance Requested:   - Medical  - Pharmacy:  Hold Rivaroxaban  (Xarelto ) No mention of Xarelto  on clearance request   Type of Anesthesia:  General    Additional requests/questions:    Tanner Fanny   04/25/2023, 2:18 PM

## 2023-04-26 ENCOUNTER — Telehealth: Payer: Self-pay | Admitting: Cardiology

## 2023-04-26 ENCOUNTER — Encounter: Payer: Self-pay | Admitting: Cardiology

## 2023-04-26 NOTE — Telephone Encounter (Signed)
Xarelto is for hypercoagulable state, not managed by cardiology.  Awaiting PET CT 05/17/23.

## 2023-04-26 NOTE — Telephone Encounter (Signed)
Patient called to report he recently had a fall and is due to have surgery.  Patient wants to know if his PET scan test should be moved up.

## 2023-04-26 NOTE — Telephone Encounter (Signed)
Reached out to PET scheduling. PET was about to be moved up to Feb 20th.

## 2023-04-26 NOTE — Telephone Encounter (Signed)
Patient identification verified by 2 forms. Jeb Levering, RN     Called and spoke to patient  Patient states: He needs to move his PET scan up due to now needing surgery.   Will reach out to scheduling to see about getting pt's PET scan sooner.           Patient agrees with plan, no questions at this time

## 2023-05-03 ENCOUNTER — Encounter (HOSPITAL_COMMUNITY): Payer: Self-pay

## 2023-05-05 ENCOUNTER — Ambulatory Visit
Admission: RE | Admit: 2023-05-05 | Discharge: 2023-05-05 | Disposition: A | Discharge status: Home / Self Care | Payer: Medicare Other | Source: Ambulatory Visit | Attending: Cardiology | Admitting: Cardiology

## 2023-05-05 DIAGNOSIS — Z951 Presence of aortocoronary bypass graft: Secondary | ICD-10-CM | POA: Diagnosis not present

## 2023-05-05 DIAGNOSIS — I2510 Atherosclerotic heart disease of native coronary artery without angina pectoris: Secondary | ICD-10-CM | POA: Insufficient documentation

## 2023-05-05 DIAGNOSIS — I517 Cardiomegaly: Secondary | ICD-10-CM | POA: Diagnosis not present

## 2023-05-05 DIAGNOSIS — I700 Atherosclerosis of aorta: Secondary | ICD-10-CM | POA: Insufficient documentation

## 2023-05-05 DIAGNOSIS — R079 Chest pain, unspecified: Secondary | ICD-10-CM

## 2023-05-05 LAB — NM PET CT CARDIAC PERFUSION MULTI W/ABSOLUTE BLOODFLOW
LV dias vol: 123 mL (ref 62–150)
MBFR: 1.44
Nuc Rest EF: 46 %
Nuc Stress EF: 46 %
Peak HR: 97 {beats}/min
Rest HR: 74 {beats}/min
Rest MBF: 0.82 ml/g/min
Rest Nuclear Isotope Dose: 23.4 mCi
Rest perfusion cavity size (mL): 123 mL
SRS: 3
SSS: 16
ST Depression (mm): 0 mm
Stress MBF: 1.18 ml/g/min
Stress Nuclear Isotope Dose: 23.4 mCi
Stress perfusion cavity size (mL): 150 mL
TID: 1.11

## 2023-05-05 MED ORDER — RUBIDIUM RB82 GENERATOR (RUBYFILL)
25.0000 | PACK | Freq: Once | INTRAVENOUS | Status: AC
  Administered 2023-05-05: 23.42 via INTRAVENOUS

## 2023-05-05 MED ORDER — REGADENOSON 0.4 MG/5ML IV SOLN
0.4000 mg | Freq: Once | INTRAVENOUS | Status: AC
  Administered 2023-05-05: 0.4 mg via INTRAVENOUS
  Filled 2023-05-05: qty 5

## 2023-05-05 MED ORDER — RUBIDIUM RB82 GENERATOR (RUBYFILL)
25.0000 | PACK | Freq: Once | INTRAVENOUS | Status: AC
  Administered 2023-05-05: 23.38 via INTRAVENOUS

## 2023-05-05 MED ORDER — REGADENOSON 0.4 MG/5ML IV SOLN
INTRAVENOUS | Status: AC
  Filled 2023-05-05: qty 5

## 2023-05-05 NOTE — H&P (View-Only) (Signed)
 Pt tolerated exam without incident; vital signs within normal limits; pt denies lightheadedness or dizziness; encouraged to drink caffeine, eat meal; pt ambulatory to lobby steady gait noted

## 2023-05-05 NOTE — Progress Notes (Signed)
 Pt tolerated exam without incident; vital signs within normal limits; pt denies lightheadedness or dizziness; encouraged to drink caffeine, eat meal; pt ambulatory to lobby steady gait noted

## 2023-05-06 ENCOUNTER — Encounter: Payer: Self-pay | Admitting: *Deleted

## 2023-05-06 ENCOUNTER — Encounter: Payer: Self-pay | Admitting: Cardiology

## 2023-05-06 NOTE — Telephone Encounter (Signed)
Please see result note for documentation 

## 2023-05-08 NOTE — Progress Notes (Deleted)
 Cardiology Office Note:    Date:  05/08/2023   ID:  Jose Bowers, DOB [birth year], MRN [id number]  PCP:  Georgann Housekeeper, MD  Cardiologist:  Little Ishikawa, MD  Electrophysiologist:  None   Referring MD: Georgann Housekeeper, MD   No chief complaint on file.    History of Present Illness:    Jose Bowers is a 73 y.o. male with a hx of CAD status post CABG (LIMA-LAD, SVG-OM, SVG sequential to PDA and PLB) factor V Leiden, hypertension, hyperlipidemia, DVT following shoulder surgery in 2014, saddle PE treated with EKOS in 2019 who presents for follow-up.  He was referred by Dr. Donette Larry for evaluation of chest pain, initially seen on 09/19/2019.  Exercise Myoview was attempted on 10/04/2019.  He did not reach target heart rate on treadmill and was converted to pharmacologic study.  Despite not reaching target heart rate, he had 1.5 mm horizontal ST depressions in inferior and anterolateral leads and symptoms of chest pain.  MPI imaging showed reversible defect in apical anterior wall and apex consistent with LAD ischemia.  LVEF 49%.  Cardiac catheterization on 10/16/2019 showed severe heavily calcified multivessel disease (99% proximal LAD, 90% proximal circumflex, 80% distal RCA, 90% RPDA).  Underwent CABG 10/18/19 ((LIMA-LAD, SVG-OM, SVG sequential to PDA and PLB).  Echocardiogram 10/16/2019 showed normal biventricular function, no significant valvular disease.  Reported chest pain and underwent stress PET 05/05/2023, which showed large reversible severe lateral wall perfusion defect; high risk study.  Since last clinic visit,  he reports he is doing okay.  Reports having pain in epigastric area/lower chest if exercising after eating.  States that even if he tries walking a few blocks after eating he has discomfort in this region.  Wt Readings from Last 3 Encounters:  03/28/23 198 lb 3.2 oz (89.9 kg)  10/14/22 178 lb (80.7 kg)  02/10/22 192 lb 12.8 oz (87.5 kg)     Past Medical History:   Diagnosis Date   Factor V Leiden (HCC)    Hypertension     Past Surgical History:  Procedure Laterality Date   CORONARY ARTERY BYPASS GRAFT N/A 10/18/2019   Procedure: CORONARY ARTERY BYPASS GRAFTING (CABG), ON PUMP, TIMES FOUR, USING LEFT INTERAL MAMMARY ATERY AND ENDOSCOPICALLY HARVESTED RIGHT GREATER SAPHENOUS VEIN;  Surgeon: Alleen Borne, MD;  Location: [facility] OR;  Service: Open Heart Surgery;  Laterality: N/A;   HERNIA REPAIR     IR ANGIOGRAM PULMONARY BILATERAL SELECTIVE  03/19/2017   IR ANGIOGRAM SELECTIVE EACH ADDITIONAL VESSEL  03/19/2017   IR ANGIOGRAM SELECTIVE EACH ADDITIONAL VESSEL  03/19/2017   IR INFUSION THROMBOL ARTERIAL INITIAL (MS)  03/19/2017   IR INFUSION THROMBOL ARTERIAL INITIAL (MS)  03/19/2017   IR THROMB F/U EVAL ART/VEN FINAL DAY (MS)  03/20/2017   IR US GUIDE VASC ACCESS RIGHT  03/19/2017   JOINT REPLACEMENT     LEFT HEART CATH AND CORONARY ANGIOGRAPHY N/A 10/16/2019   Procedure: LEFT HEART CATH AND CORONARY ANGIOGRAPHY;  Surgeon: Marykay Lex, MD;  Location: [facility] INVASIVE CV LAB;  Service: Cardiovascular;  Laterality: N/A;   ROTATOR CUFF REPAIR     TEE WITHOUT CARDIOVERSION N/A 10/18/2019   Procedure: TRANSESOPHAGEAL ECHOCARDIOGRAM (TEE);  Surgeon: Alleen Borne, MD;  Location: [facility] OR;  Service: Open Heart Surgery;  Laterality: N/A;    Current Medications: No outpatient medications have been marked as taking for the 05/11/23 encounter (Appointment) with Little Ishikawa, MD.     Allergies:   Patient has no known allergies.  Social History   Socioeconomic History   Marital status: Married    Spouse name: Not on file   Number of children: Not on file   Years of education: Not on file   Highest education level: Not on file  Occupational History   Occupation: retired     Comment: Dentist   Tobacco Use   Smoking status: Never   Smokeless tobacco: Never  Vaping Use   Vaping status: Never Used  Substance and Sexual Activity   Alcohol use: Yes     Comment: "few times a week"   Drug use: No   Sexual activity: Not on file  Other Topics Concern   Not on file  Social History Narrative   Not on file   Social Drivers of Health   Financial Resource Strain: Not on file  Food Insecurity: Not on file  Transportation Needs: Not on file  Physical Activity: Not on file  Stress: Not on file  Social Connections: Not on file     Family History: The patient's family history includes Clotting disorder in his brother, father, grandchild, and son.  ROS:   Please see the history of present illness.     All other systems reviewed and are negative.  EKGs/Labs/Other Studies Reviewed:    The following studies were reviewed today:  Intraoperative TEE 10/18/2019: - Left Ventricle: The left ventricle is essentially unchanged from  pre-bypass.  - Right Ventricle: The right ventricle appears unchanged from pre-bypass.  - Aorta: The aorta appears unchanged from pre-bypass.  - Left Atrium: The left atrium appears unchanged from pre-bypass.  - Left Atrial Appendage: The left atrial appendage appears unchanged from  pre-bypass.  - Aortic Valve: The aortic valve appears unchanged from pre-bypass.  - Mitral Valve: The mitral valve appears essentially unchanged from  pre-bypass.  - Tricuspid Valve: The tricuspid valve appears essentially unchanged from  pre-bypass.  - Interatrial Septum: The interatrial septum appears unchanged from  pre-bypass.  - Interventricular Septum: The interventricular septum appears unchanged  from  pre-bypass.  - Pericardium: The pericardium appears unchanged from pre-bypass.   US Doppler Pre CABG 10/17/2019: Right Carotid: Velocities in the right ICA are consistent with a 1-39%  stenosis.  Left Carotid: Velocities in the left ICA are consistent with a 1-39%  stenosis.  Vertebrals:  Bilateral vertebral arteries demonstrate antegrade flow.  Subclavians: Normal flow hemodynamics were seen in bilateral subclavian  arteries.  Right Upper Extremity: Doppler waveforms remain within normal limits with right radial compression. Doppler waveforms remain within normal limits with right ulnar compression.  Left Upper Extremity: Doppler waveforms remain within normal limits with left radial compression. Doppler waveforms remain within normal limits with left ulnar compression.   Echo 10/16/2019:  1. Left ventricular ejection fraction, by estimation, is 55 to 60%. The  left ventricle has normal function. The left ventricle has no regional  wall motion abnormalities. There is mild left ventricular hypertrophy of  the basal-septal segment. Left  ventricular diastolic parameters are consistent with Grade I diastolic  dysfunction (impaired relaxation).   2. Right ventricular systolic function is normal. The right ventricular  size is normal. Tricuspid regurgitation signal is inadequate for assessing  PA pressure.   3. Left atrial size was mildly dilated.   4. The mitral valve is normal in structure. No evidence of mitral valve  regurgitation. No evidence of mitral stenosis.   5. The aortic valve is normal in structure. Aortic valve regurgitation is  not visualized. No aortic stenosis  is present.   6. Aortic dilatation noted. There is borderline dilatation of the aortic  root measuring 38 mm.   7. The inferior vena cava is normal in size with greater than 50%  respiratory variability, suggesting right atrial pressure of 3 mmHg.   Comparison(s): No significant change from prior study. Prior images  reviewed side by side.   LHC 10/16/2019: -- LEFT VENTRICULAR HEMODYNAMICS & VENTRICULOGRAPHY There is mild left ventricular systolic dysfunction. LV end diastolic pressure is normal. The left ventricular ejection fraction is 45-50% by visual estimate. -- CORONARY ANGIOGRAPHY Prox LAD to Mid LAD lesion is 99% stenosed with 50% stenosed side branch in 1st Diag. Prox Cx lesion is 90% stenosed. Dist RCA lesion is 80%  stenosed with 90% stenosed side branch in RPDA. SUMMARY  Severe heavily calcified multivessel disease: Heavily calcified proximal LAD subtotal occlusion (99%) lesion beginning just prior to major SP trunk, terminating after 1st Diag --> remainder the LAD is relatively free of disease with TIMI I flow and competitive flow from right left collaterals to the apex Heavily calcified very focal 90% eccentric stenosis in the proximal LCx--LCx gives off 2 major Mrg branches and a posterior AV groove branch with 2 small PL branches--free of disease. Heavily calcified dominant RCA with diffuse 30% mid to distal disease, and 80% focal stenosis just prior to RPAV continuing into the RPDA with 90% ostial PDA lesion. Mildly reduced LVEF of roughly 45% with mid to apical anterior hypokinesis RECOMMENDATION Admit under Dr. Campbell Lerner service for medication optimization and CVTS consultation He will be started on IV Heparin while we continue to hold his Xarelto. Increase to Max dose Atorvastatin & adjust BP medications    Lexiscan Stress Test 10/04/2019: Nuclear stress EF: 49%. The left ventricular ejection fraction is mildly decreased (45-54%). Horizontal ST segment depression ST segment depression of 1.5 mm was noted during stress in the II, III, aVF, V5 and V6 leads, beginning at 7 minutes of stress, and returning to baseline after less than 1 minute of recovery. Findings consistent with ischemia. This is a high risk study.   1. Initial ECG treadmill stress did not reach target HR and was converted to pharmacological study. Despite not reaching target HR, the patient had 1.5 mm horizontal ST depression in inferior leads (II, III, AVF) and anterolateral leads (V5-6) and symptoms of chest pain. 2. On MPI imaging, there is a medium size (10-12% of the LV), moderate perfusion defect present on stress imaging in the apical anterior, and apex consistent with ischemia in the LAD distrubution.  3. LVEF is normal for  this modality, 49%.  4. Average exercise capacity (8:11 min:s; 7.0 METS). 5. Overall, this is a high risk study concerning for ischemia.  EKG:   03/28/2023: Normal sinus rhythm, rate 62 10/14/22: Normal sinus rhythm, rate 62, no ST abnormalities 02/20/21: NSR, rate 81, no ST abnormalities 08/18/2020: NSR, rate 57, No ST abnormalities 11/16/2019: normal sinus rhythm, rate 63, no ST abnormalities 10/09/2019: NSR, rate 72, no ST abnormalities  Recent Labs: No results found for requested labs within last 365 days.  Recent Lipid Panel    Component Value Date/Time   CHOL 114 01/18/2020 0845   TRIG 74 01/18/2020 0845   HDL 48 01/18/2020 0845   CHOLHDL 2.4 01/18/2020 0845   CHOLHDL 3.6 10/18/2019 0511   VLDL 24 10/18/2019 0511   LDLCALC 51 01/18/2020 0845    Physical Exam:    VS:  There were no vitals taken for this visit.  Wt Readings from Last 3 Encounters:  03/28/23 198 lb 3.2 oz (89.9 kg)  10/14/22 178 lb (80.7 kg)  02/10/22 192 lb 12.8 oz (87.5 kg)     GEN: in no acute distress HEENT: Normal NECK: No JVD; No carotid bruits CARDIAC: RRR, no murmurs, rubs, gallops RESPIRATORY:  Clear to auscultation without rales, wheezing or rhonchi  ABDOMEN: Soft, non-tender, non-distended MUSCULOSKELETAL:  No edema SKIN: Warm and dry NEUROLOGIC:  Alert and oriented x 3 PSYCHIATRIC:  Normal affect   ASSESSMENT:    No diagnosis found.     PLAN:    CAD: Cardiac catheterization on 10/16/2019 showed severe heavily calcified multivessel disease (99% proximal LAD, 90% proximal circumflex, 80% distal RCA, 90% RPDA).  Underwent CABG 10/18/19 (LIMA-LAD, SVG-OM, SVG sequential to PDA and PLB).  Echocardiogram on 10/16/2019 showed normal biventricular function, no significant valvular disease.  Reported chest pain and underwent stress PET 05/05/2023, which showed large reversible severe lateral wall perfusion defect; high risk study. -Continue Xarelto  -Continue atorvastatin 80 mg daily -Continue  Toprol-XL 25 mg daily -Recommend LHC for further evaluation***  Hypertension: continue losartan 50 mg daily and Toprol-XL 25 mg daily.  Appears controlled.  Hyperlipidemia: On atorvastatin 80 mg daily, LDL 39 on 06/04/22  Factor V Leiden: History of DVT and PE.  On Xarelto    Prediabetes: A1c 5.9% on 06/04/2022  RTC in 3 months***   Medication Adjustments/Labs and Tests Ordered: Current medicines are reviewed at length with the patient today.  Concerns regarding medicines are outlined above.  No orders of the defined types were placed in this encounter.   No orders of the defined types were placed in this encounter.    There are no Patient Instructions on file for this visit.    Signed, Little Ishikawa, MD  05/08/2023 4:08 PM    [facility]

## 2023-05-10 ENCOUNTER — Encounter: Payer: Self-pay | Admitting: Cardiology

## 2023-05-10 ENCOUNTER — Ambulatory Visit: Payer: Medicare Other | Attending: Cardiology | Admitting: Cardiology

## 2023-05-10 VITALS — BP 121/72 | HR 83 | Ht 69.5 in | Wt 197.4 lb

## 2023-05-10 DIAGNOSIS — I10 Essential (primary) hypertension: Secondary | ICD-10-CM | POA: Insufficient documentation

## 2023-05-10 DIAGNOSIS — E785 Hyperlipidemia, unspecified: Secondary | ICD-10-CM | POA: Insufficient documentation

## 2023-05-10 DIAGNOSIS — R079 Chest pain, unspecified: Secondary | ICD-10-CM | POA: Diagnosis not present

## 2023-05-10 DIAGNOSIS — I2510 Atherosclerotic heart disease of native coronary artery without angina pectoris: Secondary | ICD-10-CM | POA: Insufficient documentation

## 2023-05-10 DIAGNOSIS — Z01818 Encounter for other preprocedural examination: Secondary | ICD-10-CM | POA: Diagnosis not present

## 2023-05-10 MED ORDER — ASPIRIN 81 MG PO TBEC: 81.0000 mg | DELAYED_RELEASE_TABLET | Freq: Every day | ORAL | 3 refills | Status: DC

## 2023-05-10 MED ORDER — NITROGLYCERIN 0.4 MG SL SUBL: 0.4000 mg | SUBLINGUAL_TABLET | SUBLINGUAL | 3 refills | Status: AC | PRN

## 2023-05-10 NOTE — Patient Instructions (Signed)
 Medication Instructions:  Start Aspirin 81mg  daily Continue all current medicaions *If you need a refill on your cardiac medications before your next appointment, please call your pharmacy*   Lab Work: Cbc, bmet If you have labs (blood work) drawn today and your tests are completely normal, you will receive your results only by: MyChart Message (if you have MyChart) OR A paper copy in the mail If you have any lab test that is abnormal or we need to change your treatment, we will call you to review the results.   Testing/Procedures:  [place] [facility] [address] [place] [address] [place] [address] Dept: [phone] Loc: [phone]  Jose Bowers  05/10/2023  You are scheduled for a Cardiac Catheterization on Friday, February 28 with Dr. Alverda Skeans.  1. Please arrive at the [facility] (Main Entrance A) at [facility]: [address] [place], [place] [address] at 11:30 AM (This time is 2 hour(s) before your procedure to ensure your preparation).   Free valet parking service is available. You will check in at ADMITTING. The support person will be asked to wait in the waiting room.  It is OK to have someone drop you off and come back when you are ready to be discharged.    Special note: Every effort is made to have your procedure done on time. Please understand that emergencies sometimes delay scheduled procedures.  2. Diet: Do not eat solid foods after midnight.  The patient may have clear liquids until 5am upon the day of the procedure.  3. Labs: You will need to have blood drawn on Wednesday, February 26 at [facility] [address] [place] [address], [place]  Open: 8am - 5pm (Lunch 12:30 - 1:30)   Phone: [phone]. You do not need to be fasting.  4. Medication instructions in preparation for your procedure:   Contrast Allergy: No  Stop taking Xarelto (Rivaroxaban) on  Wednesday, February 25.   On the morning of your procedure, take your Aspirin 81 mg and any morning medicines NOT listed above.  You may use sips of water.  5. Plan to go home the same day, you will only stay overnight if medically necessary. 6. Bring a current list of your medications and current insurance cards. 7. You MUST have a responsible person to drive you home. 8. Someone MUST be with you the first 24 hours after you arrive home or your discharge will be delayed. 9. Please wear clothes that are easy to get on and off and wear slip-on shoes.  Thank you for allowing [place] to care for you!   --  Invasive Cardiovascular services    Follow-Up: At [facility], you and your health needs are our priority.  As part of our continuing mission to provide you with exceptional heart care, we have created designated Provider Care Teams.  These Care Teams include your primary Cardiologist (physician) and Advanced Practice Providers (APPs -  Physician Assistants and Nurse Practitioners) who all work together to provide you with the care you need, when you need it.  We recommend signing up for the patient portal called "MyChart".  Sign up information is provided on this After Visit Summary.  MyChart is used to connect with patients for Virtual Visits (Telemedicine).  Patients are able to view lab/test results, encounter notes, upcoming appointments, etc.  Non-urgent messages can be sent to your provider as well.   To  learn more about what you can do with MyChart, go to [email or web address].    Your next appointment:   April 8 @10 :20  Provider:   Little Ishikawa, MD     Other Instructions ECHO  Your physician has requested that you have an echocardiogram. Echocardiography is a painless test that uses sound waves to create images of your heart. It provides your doctor with information about the size and shape of your heart and how well your heart's chambers and valves  are working. This procedure takes approximately one hour. There are no restrictions for this procedure. Please do NOT wear cologne, perfume, aftershave, or lotions (deodorant is allowed). Please arrive 15 minutes prior to your appointment time.  Please note: We ask at that you not bring children with you during ultrasound (echo/ vascular) testing. Due to room size and safety concerns, children are not allowed in the ultrasound rooms during exams. Our front office staff cannot provide observation of children in our lobby area while testing is being conducted. An adult accompanying a patient to their appointment will only be allowed in the ultrasound room at the discretion of the ultrasound technician under special circumstances. We apologize for any inconvenience.

## 2023-05-10 NOTE — H&P (View-Only) (Signed)
 Cardiology Office Note:    Date:  05/10/2023   ID:  Jose Bowers, DOB 1950-06-09, MRN [id number]  PCP:  Jose Housekeeper, MD  Cardiologist:  Jose Ishikawa, MD  Electrophysiologist:  None   Referring MD: Jose Housekeeper, MD   Chief Complaint  Patient presents with   Coronary Artery Disease    History of Present Illness:    Jose Bowers is a 73 y.o. male with a hx of CAD status post CABG (LIMA-LAD, SVG-OM, SVG sequential to PDA and PLB) factor V Leiden, hypertension, hyperlipidemia, DVT following shoulder surgery in 2014, saddle PE treated with EKOS in 2019 who presents for follow-up.  He was referred by Dr. Donette Bowers for evaluation of chest pain, initially seen on 09/19/2019.  Exercise Myoview was attempted on 10/04/2019.  He did not reach target heart rate on treadmill and was converted to pharmacologic study.  Despite not reaching target heart rate, he had 1.5 mm horizontal ST depressions in inferior and anterolateral leads and symptoms of chest pain.  MPI imaging showed reversible defect in apical anterior wall and apex consistent with LAD ischemia.  LVEF 49%.  Cardiac catheterization on 10/16/2019 showed severe heavily calcified multivessel disease (99% proximal LAD, 90% proximal circumflex, 80% distal RCA, 90% RPDA).  Underwent CABG 10/18/19 ((LIMA-LAD, SVG-OM, SVG sequential to PDA and PLB).  Echocardiogram 10/16/2019 showed normal biventricular function, no significant valvular disease.  Reported chest pain and underwent stress PET 05/05/2023, which showed large reversible severe lateral wall perfusion defect; high risk study.   Since last clinic visit, he reports he is doing okay.  He continues to have chest pain.  States that improved with starting PPI but still has discomfort.  He had a fall and tore rotator cuff in left arm, planning surgery.  Wt Readings from Last 3 Encounters:  05/10/23 197 lb 6.4 oz (89.5 kg)  03/28/23 198 lb 3.2 oz (89.9 kg)  10/14/22 178 lb (80.7 kg)      Past Medical History:  Diagnosis Date   Factor V Leiden (HCC)    Hypertension     Past Surgical History:  Procedure Laterality Date   CORONARY ARTERY BYPASS GRAFT N/A 10/18/2019   Procedure: CORONARY ARTERY BYPASS GRAFTING (CABG), ON PUMP, TIMES FOUR, USING LEFT INTERAL MAMMARY ATERY AND ENDOSCOPICALLY HARVESTED RIGHT GREATER SAPHENOUS VEIN;  Surgeon: Alleen Borne, MD;  Location: [facility] OR;  Service: Open Heart Surgery;  Laterality: N/A;   HERNIA REPAIR     IR ANGIOGRAM PULMONARY BILATERAL SELECTIVE  03/19/2017   IR ANGIOGRAM SELECTIVE EACH ADDITIONAL VESSEL  03/19/2017   IR ANGIOGRAM SELECTIVE EACH ADDITIONAL VESSEL  03/19/2017   IR INFUSION THROMBOL ARTERIAL INITIAL (MS)  03/19/2017   IR INFUSION THROMBOL ARTERIAL INITIAL (MS)  03/19/2017   IR THROMB F/U EVAL ART/VEN FINAL DAY (MS)  03/20/2017   IR US GUIDE VASC ACCESS RIGHT  03/19/2017   JOINT REPLACEMENT     LEFT HEART CATH AND CORONARY ANGIOGRAPHY N/A 10/16/2019   Procedure: LEFT HEART CATH AND CORONARY ANGIOGRAPHY;  Surgeon: Jose Lex, MD;  Location: [facility] INVASIVE CV LAB;  Service: Cardiovascular;  Laterality: N/A;   ROTATOR CUFF REPAIR     TEE WITHOUT CARDIOVERSION N/A 10/18/2019   Procedure: TRANSESOPHAGEAL ECHOCARDIOGRAM (TEE);  Surgeon: Alleen Borne, MD;  Location: [facility] OR;  Service: Open Heart Surgery;  Laterality: N/A;    Current Medications: Current Meds  Medication Sig   aspirin EC 81 MG tablet Take 1 tablet (81 mg total) by mouth daily. Swallow whole.  atorvastatin (LIPITOR) 80 MG tablet TAKE 1 TABLET BY MOUTH DAILY AT 6 PM.   metoprolol succinate (TOPROL-XL) 25 MG 24 hr tablet TAKE 1 TABLET (25 MG TOTAL) BY MOUTH DAILY.   nitroGLYCERIN (NITROSTAT) 0.4 MG SL tablet Place 1 tablet (0.4 mg total) under the tongue every 5 (five) minutes as needed for chest pain.   omeprazole (PRILOSEC) 20 MG capsule Take 1 capsule (20 mg total) by mouth daily.   XARELTO 20 MG TABS tablet Take 20 mg by mouth daily.     Allergies:   Patient  has no known allergies.   Social History   Socioeconomic History   Marital status: Married    Spouse name: Not on file   Number of children: Not on file   Years of education: Not on file   Highest education level: Not on file  Occupational History   Occupation: retired     Comment: Dentist   Tobacco Use   Smoking status: Never   Smokeless tobacco: Never  Vaping Use   Vaping status: Never Used  Substance and Sexual Activity   Alcohol use: Yes    Comment: "few times a week"   Drug use: No   Sexual activity: Not on file  Other Topics Concern   Not on file  Social History Narrative   Not on file   Social Drivers of Health   Financial Resource Strain: Not on file  Food Insecurity: Not on file  Transportation Needs: Not on file  Physical Activity: Not on file  Stress: Not on file  Social Connections: Not on file     Family History: The patient's family history includes Clotting disorder in his brother, father, grandchild, and son.  ROS:   Please see the history of present illness.     All other systems reviewed and are negative.  EKGs/Labs/Other Studies Reviewed:    The following studies were reviewed today:  Intraoperative TEE 10/18/2019: - Left Ventricle: The left ventricle is essentially unchanged from  pre-bypass.  - Right Ventricle: The right ventricle appears unchanged from pre-bypass.  - Aorta: The aorta appears unchanged from pre-bypass.  - Left Atrium: The left atrium appears unchanged from pre-bypass.  - Left Atrial Appendage: The left atrial appendage appears unchanged from  pre-bypass.  - Aortic Valve: The aortic valve appears unchanged from pre-bypass.  - Mitral Valve: The mitral valve appears essentially unchanged from  pre-bypass.  - Tricuspid Valve: The tricuspid valve appears essentially unchanged from  pre-bypass.  - Interatrial Septum: The interatrial septum appears unchanged from  pre-bypass.  - Interventricular Septum: The  interventricular septum appears unchanged  from  pre-bypass.  - Pericardium: The pericardium appears unchanged from pre-bypass.   US Doppler Pre CABG 10/17/2019: Right Carotid: Velocities in the right ICA are consistent with a 1-39%  stenosis.  Left Carotid: Velocities in the left ICA are consistent with a 1-39%  stenosis.  Vertebrals:  Bilateral vertebral arteries demonstrate antegrade flow.  Subclavians: Normal flow hemodynamics were seen in bilateral subclavian arteries.  Right Upper Extremity: Doppler waveforms remain within normal limits with right radial compression. Doppler waveforms remain within normal limits with right ulnar compression.  Left Upper Extremity: Doppler waveforms remain within normal limits with left radial compression. Doppler waveforms remain within normal limits with left ulnar compression.   Echo 10/16/2019:  1. Left ventricular ejection fraction, by estimation, is 55 to 60%. The  left ventricle has normal function. The left ventricle has no regional  wall motion abnormalities. There  is mild left ventricular hypertrophy of  the basal-septal segment. Left  ventricular diastolic parameters are consistent with Grade I diastolic  dysfunction (impaired relaxation).   2. Right ventricular systolic function is normal. The right ventricular  size is normal. Tricuspid regurgitation signal is inadequate for assessing  PA pressure.   3. Left atrial size was mildly dilated.   4. The mitral valve is normal in structure. No evidence of mitral valve  regurgitation. No evidence of mitral stenosis.   5. The aortic valve is normal in structure. Aortic valve regurgitation is  not visualized. No aortic stenosis is present.   6. Aortic dilatation noted. There is borderline dilatation of the aortic  root measuring 38 mm.   7. The inferior vena cava is normal in size with greater than 50%  respiratory variability, suggesting right atrial pressure of 3 mmHg.   Comparison(s): No  significant change from prior study. Prior images  reviewed side by side.   LHC 10/16/2019: -- LEFT VENTRICULAR HEMODYNAMICS & VENTRICULOGRAPHY There is mild left ventricular systolic dysfunction. LV end diastolic pressure is normal. The left ventricular ejection fraction is 45-50% by visual estimate. -- CORONARY ANGIOGRAPHY Prox LAD to Mid LAD lesion is 99% stenosed with 50% stenosed side branch in 1st Diag. Prox Cx lesion is 90% stenosed. Dist RCA lesion is 80% stenosed with 90% stenosed side branch in RPDA. SUMMARY  Severe heavily calcified multivessel disease: Heavily calcified proximal LAD subtotal occlusion (99%) lesion beginning just prior to major SP trunk, terminating after 1st Diag --> remainder the LAD is relatively free of disease with TIMI I flow and competitive flow from right left collaterals to the apex Heavily calcified very focal 90% eccentric stenosis in the proximal LCx--LCx gives off 2 major Mrg branches and a posterior AV groove branch with 2 small PL branches--free of disease. Heavily calcified dominant RCA with diffuse 30% mid to distal disease, and 80% focal stenosis just prior to RPAV continuing into the RPDA with 90% ostial PDA lesion. Mildly reduced LVEF of roughly 45% with mid to apical anterior hypokinesis RECOMMENDATION Admit under Dr. Campbell Lerner service for medication optimization and CVTS consultation He will be started on IV Heparin while we continue to hold his Xarelto. Increase to Max dose Atorvastatin & adjust BP medications    Lexiscan Stress Test 10/04/2019: Nuclear stress EF: 49%. The left ventricular ejection fraction is mildly decreased (45-54%). Horizontal ST segment depression ST segment depression of 1.5 mm was noted during stress in the II, III, aVF, V5 and V6 leads, beginning at 7 minutes of stress, and returning to baseline after less than 1 minute of recovery. Findings consistent with ischemia. This is a high risk study.   1. Initial ECG  treadmill stress did not reach target HR and was converted to pharmacological study. Despite not reaching target HR, the patient had 1.5 mm horizontal ST depression in inferior leads (II, III, AVF) and anterolateral leads (V5-6) and symptoms of chest pain. 2. On MPI imaging, there is a medium size (10-12% of the LV), moderate perfusion defect present on stress imaging in the apical anterior, and apex consistent with ischemia in the LAD distrubution.  3. LVEF is normal for this modality, 49%.  4. Average exercise capacity (8:11 min:s; 7.0 METS). 5. Overall, this is a high risk study concerning for ischemia.  EKG:   03/28/2023: Normal sinus rhythm, rate 62 10/14/22: Normal sinus rhythm, rate 62, no ST abnormalities 02/20/21: NSR, rate 81, no ST abnormalities 08/18/2020: NSR, rate 57, No  ST abnormalities 11/16/2019: normal sinus rhythm, rate 63, no ST abnormalities 10/09/2019: NSR, rate 72, no ST abnormalities  Recent Labs: No results found for requested labs within last 365 days.  Recent Lipid Panel    Component Value Date/Time   CHOL 114 01/18/2020 0845   TRIG 74 01/18/2020 0845   HDL 48 01/18/2020 0845   CHOLHDL 2.4 01/18/2020 0845   CHOLHDL 3.6 10/18/2019 0511   VLDL 24 10/18/2019 0511   LDLCALC 51 01/18/2020 0845    Physical Exam:    VS:  BP 121/72   Pulse 83   Ht 5' 9.5" (1.765 m)   Wt 197 lb 6.4 oz (89.5 kg)   SpO2 96%   BMI 28.73 kg/m     Wt Readings from Last 3 Encounters:  05/10/23 197 lb 6.4 oz (89.5 kg)  03/28/23 198 lb 3.2 oz (89.9 kg)  10/14/22 178 lb (80.7 kg)     GEN: in no acute distress HEENT: Normal NECK: No JVD; No carotid bruits CARDIAC: RRR, no murmurs, rubs, gallops RESPIRATORY:  Clear to auscultation without rales, wheezing or rhonchi  ABDOMEN: Soft, non-tender, non-distended MUSCULOSKELETAL:  No edema SKIN: Warm and dry NEUROLOGIC:  Alert and oriented x 3 PSYCHIATRIC:  Normal affect   ASSESSMENT:    1. Chest pain, unspecified type   2. CAD in  native artery   3. Essential hypertension   4. Pre-op evaluation   5. Hyperlipidemia, unspecified hyperlipidemia type      PLAN:    CAD: Cardiac catheterization on 10/16/2019 showed severe heavily calcified multivessel disease (99% proximal LAD, 90% proximal circumflex, 80% distal RCA, 90% RPDA).  Underwent CABG 10/18/19 (LIMA-LAD, SVG-OM, SVG sequential to PDA and PLB).  Echocardiogram on 10/16/2019 showed normal biventricular function, no significant valvular disease.  Reported chest pain and underwent stress PET 05/05/2023, which showed large reversible severe lateral wall perfusion defect; high risk study. -Recommend LHC, scheduled for 2/28 with Dr Lynnette Caffey.  Risks and benefits of cardiac catheterization have been discussed with the patient.  These include bleeding, infection, kidney damage, stroke, heart attack, death.  The patient understands these risks and is willing to proceed. -Continue Xarelto, though will hold for cath on 2/28 and start ASA 81 mg daily -Continue atorvastatin 80 mg daily -Continue Toprol-XL 25 mg daily -As needed SL NTG -Mild systolic dysfunction on PET, will update echocardiogram  Preop evaluation: Reports had a fall and tore rotator cuff in left shoulder, states that he was seen by orthopedist and recommended surgery.  Given he has been having chest pain with high risk stress PET as above, recommend LHC prior to surgery.  If stent placed, would need to complete 6 months of DAPT prior to surgery.  Will follow-up LHC results   Hypertension: continue losartan 50 mg daily and Toprol-XL 25 mg daily.  Appears controlled.   Hyperlipidemia: On atorvastatin 80 mg daily, LDL 39 on 06/04/22   Factor V Leiden: History of DVT and PE.  On Xarelto     Prediabetes: A1c 5.9% on 06/04/2022   RTC in 1 month   Medication Adjustments/Labs and Tests Ordered: Current medicines are reviewed at length with the patient today.  Concerns regarding medicines are outlined above.  Orders Placed  This Encounter  Procedures   Basic Metabolic Panel (BMET)   CBC   ECHOCARDIOGRAM COMPLETE   LEFT HEART CATHETERIZATION WITH CORONARY ANGIOGRAM    Meds ordered this encounter  Medications   aspirin EC 81 MG tablet    Sig: Take 1  tablet (81 mg total) by mouth daily. Swallow whole.    Dispense:  90 tablet    Refill:  3   nitroGLYCERIN (NITROSTAT) 0.4 MG SL tablet    Sig: Place 1 tablet (0.4 mg total) under the tongue every 5 (five) minutes as needed for chest pain.    Dispense:  25 tablet    Refill:  3     Patient Instructions  Medication Instructions:  Start Aspirin 81mg  daily Continue all current medicaions *If you need a refill on your cardiac medications before your next appointment, please call your pharmacy*   Lab Work: Cbc, bmet If you have labs (blood work) drawn today and your tests are completely normal, you will receive your results only by: MyChart Message (if you have MyChart) OR A paper copy in the mail If you have any lab test that is abnormal or we need to change your treatment, we will call you to review the results.   Testing/Procedures:  [place] [facility] DEPT OF [facility] AT [facility] [address] [place] [place] [address] Dept: [phone] Loc: [phone]  Miliano Cotten  05/10/2023  You are scheduled for a Cardiac Catheterization on Friday, February 28 with Dr. Alverda Skeans.  1. Please arrive at the [facility] (Main Entrance A) at [facility]: [address] [place], [place] [address] at 11:30 AM (This time is 2 hour(s) before your procedure to ensure your preparation).   Free valet parking service is available. You will check in at ADMITTING. The support person will be asked to wait in the waiting room.  It is OK to have someone drop you off and come back when you are ready to be discharged.    Special note: Every effort is made to have your procedure done on time.  Please understand that emergencies sometimes delay scheduled procedures.  2. Diet: Do not eat solid foods after midnight.  The patient may have clear liquids until 5am upon the day of the procedure.  3. Labs: You will need to have blood drawn on Wednesday, February 26 at [facility] [address] [facility] [address], [place]  Open: 8am - 5pm (Lunch 12:30 - 1:30)   Phone: [phone]. You do not need to be fasting.  4. Medication instructions in preparation for your procedure:   Contrast Allergy: No  Stop taking Xarelto (Rivaroxaban) on Wednesday, February 25.   On the morning of your procedure, take your Aspirin 81 mg and any morning medicines NOT listed above.  You may use sips of water.  5. Plan to go home the same day, you will only stay overnight if medically necessary. 6. Bring a current list of your medications and current insurance cards. 7. You MUST have a responsible person to drive you home. 8. Someone MUST be with you the first 24 hours after you arrive home or your discharge will be delayed. 9. Please wear clothes that are easy to get on and off and wear slip-on shoes.  Thank you for allowing [place] to care for you!   -- [facility]    Follow-Up: At [facility], you and your health needs are our priority.  As part of our continuing mission to provide you with exceptional heart care, we have created designated Provider Care Teams.  These Care Teams include your primary Cardiologist (physician) and Advanced Practice Providers (APPs -  Physician Assistants and Nurse Practitioners) who all work together to  provide you with the care you need, when you need it.  We recommend signing up for the patient portal called "MyChart".  Sign up information is provided on this After Visit Summary.  MyChart is used to connect with patients for Virtual Visits (Telemedicine).  Patients are able to view lab/test results, encounter notes, upcoming appointments,  etc.  Non-urgent messages can be sent to your provider as well.   To learn more about what you can do with MyChart, go to [email or web address].    Your next appointment:   April 8 @10 :20  Provider:   Little Ishikawa, MD     Other Instructions ECHO  Your physician has requested that you have an echocardiogram. Echocardiography is a painless test that uses sound waves to create images of your heart. It provides your doctor with information about the size and shape of your heart and how well your heart's chambers and valves are working. This procedure takes approximately one hour. There are no restrictions for this procedure. Please do NOT wear cologne, perfume, aftershave, or lotions (deodorant is allowed). Please arrive 15 minutes prior to your appointment time.  Please note: We ask at that you not bring children with you during ultrasound (echo/ vascular) testing. Due to room size and safety concerns, children are not allowed in the ultrasound rooms during exams. Our front office staff cannot provide observation of children in our lobby area while testing is being conducted. An adult accompanying a patient to their appointment will only be allowed in the ultrasound room at the discretion of the ultrasound technician under special circumstances. We apologize for any inconvenience.         Signed, Jose Ishikawa, MD  05/10/2023 5:53 PM    [facility]

## 2023-05-10 NOTE — Progress Notes (Signed)
 Cardiology Office Note:    Date:  05/10/2023   ID:  Jose Bowers, DOB 1950-06-09, MRN [id number]  PCP:  Georgann Housekeeper, MD  Cardiologist:  Little Ishikawa, MD  Electrophysiologist:  None   Referring MD: Georgann Housekeeper, MD   Chief Complaint  Patient presents with   Coronary Artery Disease    History of Present Illness:    Jose Bowers is a 73 y.o. male with a hx of CAD status post CABG (LIMA-LAD, SVG-OM, SVG sequential to PDA and PLB) factor V Leiden, hypertension, hyperlipidemia, DVT following shoulder surgery in 2014, saddle PE treated with EKOS in 2019 who presents for follow-up.  He was referred by Dr. Donette Larry for evaluation of chest pain, initially seen on 09/19/2019.  Exercise Myoview was attempted on 10/04/2019.  He did not reach target heart rate on treadmill and was converted to pharmacologic study.  Despite not reaching target heart rate, he had 1.5 mm horizontal ST depressions in inferior and anterolateral leads and symptoms of chest pain.  MPI imaging showed reversible defect in apical anterior wall and apex consistent with LAD ischemia.  LVEF 49%.  Cardiac catheterization on 10/16/2019 showed severe heavily calcified multivessel disease (99% proximal LAD, 90% proximal circumflex, 80% distal RCA, 90% RPDA).  Underwent CABG 10/18/19 ((LIMA-LAD, SVG-OM, SVG sequential to PDA and PLB).  Echocardiogram 10/16/2019 showed normal biventricular function, no significant valvular disease.  Reported chest pain and underwent stress PET 05/05/2023, which showed large reversible severe lateral wall perfusion defect; high risk study.   Since last clinic visit, he reports he is doing okay.  He continues to have chest pain.  States that improved with starting PPI but still has discomfort.  He had a fall and tore rotator cuff in left arm, planning surgery.  Wt Readings from Last 3 Encounters:  05/10/23 197 lb 6.4 oz (89.5 kg)  03/28/23 198 lb 3.2 oz (89.9 kg)  10/14/22 178 lb (80.7 kg)      Past Medical History:  Diagnosis Date   Factor V Leiden (HCC)    Hypertension     Past Surgical History:  Procedure Laterality Date   CORONARY ARTERY BYPASS GRAFT N/A 10/18/2019   Procedure: CORONARY ARTERY BYPASS GRAFTING (CABG), ON PUMP, TIMES FOUR, USING LEFT INTERAL MAMMARY ATERY AND ENDOSCOPICALLY HARVESTED RIGHT GREATER SAPHENOUS VEIN;  Surgeon: Alleen Borne, MD;  Location: [facility] OR;  Service: Open Heart Surgery;  Laterality: N/A;   HERNIA REPAIR     IR ANGIOGRAM PULMONARY BILATERAL SELECTIVE  03/19/2017   IR ANGIOGRAM SELECTIVE EACH ADDITIONAL VESSEL  03/19/2017   IR ANGIOGRAM SELECTIVE EACH ADDITIONAL VESSEL  03/19/2017   IR INFUSION THROMBOL ARTERIAL INITIAL (MS)  03/19/2017   IR INFUSION THROMBOL ARTERIAL INITIAL (MS)  03/19/2017   IR THROMB F/U EVAL ART/VEN FINAL DAY (MS)  03/20/2017   IR US GUIDE VASC ACCESS RIGHT  03/19/2017   JOINT REPLACEMENT     LEFT HEART CATH AND CORONARY ANGIOGRAPHY N/A 10/16/2019   Procedure: LEFT HEART CATH AND CORONARY ANGIOGRAPHY;  Surgeon: Marykay Lex, MD;  Location: [facility] INVASIVE CV LAB;  Service: Cardiovascular;  Laterality: N/A;   ROTATOR CUFF REPAIR     TEE WITHOUT CARDIOVERSION N/A 10/18/2019   Procedure: TRANSESOPHAGEAL ECHOCARDIOGRAM (TEE);  Surgeon: Alleen Borne, MD;  Location: [facility] OR;  Service: Open Heart Surgery;  Laterality: N/A;    Current Medications: Current Meds  Medication Sig   aspirin EC 81 MG tablet Take 1 tablet (81 mg total) by mouth daily. Swallow whole.  atorvastatin (LIPITOR) 80 MG tablet TAKE 1 TABLET BY MOUTH DAILY AT 6 PM.   metoprolol succinate (TOPROL-XL) 25 MG 24 hr tablet TAKE 1 TABLET (25 MG TOTAL) BY MOUTH DAILY.   nitroGLYCERIN (NITROSTAT) 0.4 MG SL tablet Place 1 tablet (0.4 mg total) under the tongue every 5 (five) minutes as needed for chest pain.   omeprazole (PRILOSEC) 20 MG capsule Take 1 capsule (20 mg total) by mouth daily.   XARELTO 20 MG TABS tablet Take 20 mg by mouth daily.     Allergies:   Patient  has no known allergies.   Social History   Socioeconomic History   Marital status: Married    Spouse name: Not on file   Number of children: Not on file   Years of education: Not on file   Highest education level: Not on file  Occupational History   Occupation: retired     Comment: Dentist   Tobacco Use   Smoking status: Never   Smokeless tobacco: Never  Vaping Use   Vaping status: Never Used  Substance and Sexual Activity   Alcohol use: Yes    Comment: "few times a week"   Drug use: No   Sexual activity: Not on file  Other Topics Concern   Not on file  Social History Narrative   Not on file   Social Drivers of Health   Financial Resource Strain: Not on file  Food Insecurity: Not on file  Transportation Needs: Not on file  Physical Activity: Not on file  Stress: Not on file  Social Connections: Not on file     Family History: The patient's family history includes Clotting disorder in his brother, father, grandchild, and son.  ROS:   Please see the history of present illness.     All other systems reviewed and are negative.  EKGs/Labs/Other Studies Reviewed:    The following studies were reviewed today:  Intraoperative TEE 10/18/2019: - Left Ventricle: The left ventricle is essentially unchanged from  pre-bypass.  - Right Ventricle: The right ventricle appears unchanged from pre-bypass.  - Aorta: The aorta appears unchanged from pre-bypass.  - Left Atrium: The left atrium appears unchanged from pre-bypass.  - Left Atrial Appendage: The left atrial appendage appears unchanged from  pre-bypass.  - Aortic Valve: The aortic valve appears unchanged from pre-bypass.  - Mitral Valve: The mitral valve appears essentially unchanged from  pre-bypass.  - Tricuspid Valve: The tricuspid valve appears essentially unchanged from  pre-bypass.  - Interatrial Septum: The interatrial septum appears unchanged from  pre-bypass.  - Interventricular Septum: The  interventricular septum appears unchanged  from  pre-bypass.  - Pericardium: The pericardium appears unchanged from pre-bypass.   US Doppler Pre CABG 10/17/2019: Right Carotid: Velocities in the right ICA are consistent with a 1-39%  stenosis.  Left Carotid: Velocities in the left ICA are consistent with a 1-39%  stenosis.  Vertebrals:  Bilateral vertebral arteries demonstrate antegrade flow.  Subclavians: Normal flow hemodynamics were seen in bilateral subclavian arteries.  Right Upper Extremity: Doppler waveforms remain within normal limits with right radial compression. Doppler waveforms remain within normal limits with right ulnar compression.  Left Upper Extremity: Doppler waveforms remain within normal limits with left radial compression. Doppler waveforms remain within normal limits with left ulnar compression.   Echo 10/16/2019:  1. Left ventricular ejection fraction, by estimation, is 55 to 60%. The  left ventricle has normal function. The left ventricle has no regional  wall motion abnormalities. There  is mild left ventricular hypertrophy of  the basal-septal segment. Left  ventricular diastolic parameters are consistent with Grade I diastolic  dysfunction (impaired relaxation).   2. Right ventricular systolic function is normal. The right ventricular  size is normal. Tricuspid regurgitation signal is inadequate for assessing  PA pressure.   3. Left atrial size was mildly dilated.   4. The mitral valve is normal in structure. No evidence of mitral valve  regurgitation. No evidence of mitral stenosis.   5. The aortic valve is normal in structure. Aortic valve regurgitation is  not visualized. No aortic stenosis is present.   6. Aortic dilatation noted. There is borderline dilatation of the aortic  root measuring 38 mm.   7. The inferior vena cava is normal in size with greater than 50%  respiratory variability, suggesting right atrial pressure of 3 mmHg.   Comparison(s): No  significant change from prior study. Prior images  reviewed side by side.   LHC 10/16/2019: -- LEFT VENTRICULAR HEMODYNAMICS & VENTRICULOGRAPHY There is mild left ventricular systolic dysfunction. LV end diastolic pressure is normal. The left ventricular ejection fraction is 45-50% by visual estimate. -- CORONARY ANGIOGRAPHY Prox LAD to Mid LAD lesion is 99% stenosed with 50% stenosed side branch in 1st Diag. Prox Cx lesion is 90% stenosed. Dist RCA lesion is 80% stenosed with 90% stenosed side branch in RPDA. SUMMARY  Severe heavily calcified multivessel disease: Heavily calcified proximal LAD subtotal occlusion (99%) lesion beginning just prior to major SP trunk, terminating after 1st Diag --> remainder the LAD is relatively free of disease with TIMI I flow and competitive flow from right left collaterals to the apex Heavily calcified very focal 90% eccentric stenosis in the proximal LCx--LCx gives off 2 major Mrg branches and a posterior AV groove branch with 2 small PL branches--free of disease. Heavily calcified dominant RCA with diffuse 30% mid to distal disease, and 80% focal stenosis just prior to RPAV continuing into the RPDA with 90% ostial PDA lesion. Mildly reduced LVEF of roughly 45% with mid to apical anterior hypokinesis RECOMMENDATION Admit under Dr. Campbell Lerner service for medication optimization and CVTS consultation He will be started on IV Heparin while we continue to hold his Xarelto. Increase to Max dose Atorvastatin & adjust BP medications    Lexiscan Stress Test 10/04/2019: Nuclear stress EF: 49%. The left ventricular ejection fraction is mildly decreased (45-54%). Horizontal ST segment depression ST segment depression of 1.5 mm was noted during stress in the II, III, aVF, V5 and V6 leads, beginning at 7 minutes of stress, and returning to baseline after less than 1 minute of recovery. Findings consistent with ischemia. This is a high risk study.   1. Initial ECG  treadmill stress did not reach target HR and was converted to pharmacological study. Despite not reaching target HR, the patient had 1.5 mm horizontal ST depression in inferior leads (II, III, AVF) and anterolateral leads (V5-6) and symptoms of chest pain. 2. On MPI imaging, there is a medium size (10-12% of the LV), moderate perfusion defect present on stress imaging in the apical anterior, and apex consistent with ischemia in the LAD distrubution.  3. LVEF is normal for this modality, 49%.  4. Average exercise capacity (8:11 min:s; 7.0 METS). 5. Overall, this is a high risk study concerning for ischemia.  EKG:   03/28/2023: Normal sinus rhythm, rate 62 10/14/22: Normal sinus rhythm, rate 62, no ST abnormalities 02/20/21: NSR, rate 81, no ST abnormalities 08/18/2020: NSR, rate 57, No  ST abnormalities 11/16/2019: normal sinus rhythm, rate 63, no ST abnormalities 10/09/2019: NSR, rate 72, no ST abnormalities  Recent Labs: No results found for requested labs within last 365 days.  Recent Lipid Panel    Component Value Date/Time   CHOL 114 01/18/2020 0845   TRIG 74 01/18/2020 0845   HDL 48 01/18/2020 0845   CHOLHDL 2.4 01/18/2020 0845   CHOLHDL 3.6 10/18/2019 0511   VLDL 24 10/18/2019 0511   LDLCALC 51 01/18/2020 0845    Physical Exam:    VS:  BP 121/72   Pulse 83   Ht 5' 9.5" (1.765 m)   Wt 197 lb 6.4 oz (89.5 kg)   SpO2 96%   BMI 28.73 kg/m     Wt Readings from Last 3 Encounters:  05/10/23 197 lb 6.4 oz (89.5 kg)  03/28/23 198 lb 3.2 oz (89.9 kg)  10/14/22 178 lb (80.7 kg)     GEN: in no acute distress HEENT: Normal NECK: No JVD; No carotid bruits CARDIAC: RRR, no murmurs, rubs, gallops RESPIRATORY:  Clear to auscultation without rales, wheezing or rhonchi  ABDOMEN: Soft, non-tender, non-distended MUSCULOSKELETAL:  No edema SKIN: Warm and dry NEUROLOGIC:  Alert and oriented x 3 PSYCHIATRIC:  Normal affect   ASSESSMENT:    1. Chest pain, unspecified type   2. CAD in  native artery   3. Essential hypertension   4. Pre-op evaluation   5. Hyperlipidemia, unspecified hyperlipidemia type      PLAN:    CAD: Cardiac catheterization on 10/16/2019 showed severe heavily calcified multivessel disease (99% proximal LAD, 90% proximal circumflex, 80% distal RCA, 90% RPDA).  Underwent CABG 10/18/19 (LIMA-LAD, SVG-OM, SVG sequential to PDA and PLB).  Echocardiogram on 10/16/2019 showed normal biventricular function, no significant valvular disease.  Reported chest pain and underwent stress PET 05/05/2023, which showed large reversible severe lateral wall perfusion defect; high risk study. -Recommend LHC, scheduled for 2/28 with Dr Lynnette Caffey.  Risks and benefits of cardiac catheterization have been discussed with the patient.  These include bleeding, infection, kidney damage, stroke, heart attack, death.  The patient understands these risks and is willing to proceed. -Continue Xarelto, though will hold for cath on 2/28 and start ASA 81 mg daily -Continue atorvastatin 80 mg daily -Continue Toprol-XL 25 mg daily -As needed SL NTG -Mild systolic dysfunction on PET, will update echocardiogram  Preop evaluation: Reports had a fall and tore rotator cuff in left shoulder, states that he was seen by orthopedist and recommended surgery.  Given he has been having chest pain with high risk stress PET as above, recommend LHC prior to surgery.  If stent placed, would need to complete 6 months of DAPT prior to surgery.  Will follow-up LHC results   Hypertension: continue losartan 50 mg daily and Toprol-XL 25 mg daily.  Appears controlled.   Hyperlipidemia: On atorvastatin 80 mg daily, LDL 39 on 06/04/22   Factor V Leiden: History of DVT and PE.  On Xarelto     Prediabetes: A1c 5.9% on 06/04/2022   RTC in 1 month   Medication Adjustments/Labs and Tests Ordered: Current medicines are reviewed at length with the patient today.  Concerns regarding medicines are outlined above.  Orders Placed  This Encounter  Procedures   Basic Metabolic Panel (BMET)   CBC   ECHOCARDIOGRAM COMPLETE   LEFT HEART CATHETERIZATION WITH CORONARY ANGIOGRAM    Meds ordered this encounter  Medications   aspirin EC 81 MG tablet    Sig: Take 1  tablet (81 mg total) by mouth daily. Swallow whole.    Dispense:  90 tablet    Refill:  3   nitroGLYCERIN (NITROSTAT) 0.4 MG SL tablet    Sig: Place 1 tablet (0.4 mg total) under the tongue every 5 (five) minutes as needed for chest pain.    Dispense:  25 tablet    Refill:  3     Patient Instructions  Medication Instructions:  Start Aspirin 81mg  daily Continue all current medicaions *If you need a refill on your cardiac medications before your next appointment, please call your pharmacy*   Lab Work: Cbc, bmet If you have labs (blood work) drawn today and your tests are completely normal, you will receive your results only by: MyChart Message (if you have MyChart) OR A paper copy in the mail If you have any lab test that is abnormal or we need to change your treatment, we will call you to review the results.   Testing/Procedures:  [place] [facility] DEPT OF [facility] AT [facility] [address] [place] [place] [address] Dept: [phone] Loc: [phone]  Jose Bowers  05/10/2023  You are scheduled for a Cardiac Catheterization on Friday, February 28 with Dr. Alverda Skeans.  1. Please arrive at the [facility] (Main Entrance A) at [facility]: [address] [place], [place] [address] at 11:30 AM (This time is 2 hour(s) before your procedure to ensure your preparation).   Free valet parking service is available. You will check in at ADMITTING. The support person will be asked to wait in the waiting room.  It is OK to have someone drop you off and come back when you are ready to be discharged.    Special note: Every effort is made to have your procedure done on time.  Please understand that emergencies sometimes delay scheduled procedures.  2. Diet: Do not eat solid foods after midnight.  The patient may have clear liquids until 5am upon the day of the procedure.  3. Labs: You will need to have blood drawn on Wednesday, February 26 at [facility] [address] [facility] [address], [place]  Open: 8am - 5pm (Lunch 12:30 - 1:30)   Phone: [phone]. You do not need to be fasting.  4. Medication instructions in preparation for your procedure:   Contrast Allergy: No  Stop taking Xarelto (Rivaroxaban) on Wednesday, February 25.   On the morning of your procedure, take your Aspirin 81 mg and any morning medicines NOT listed above.  You may use sips of water.  5. Plan to go home the same day, you will only stay overnight if medically necessary. 6. Bring a current list of your medications and current insurance cards. 7. You MUST have a responsible person to drive you home. 8. Someone MUST be with you the first 24 hours after you arrive home or your discharge will be delayed. 9. Please wear clothes that are easy to get on and off and wear slip-on shoes.  Thank you for allowing [place] to care for you!   -- [facility]    Follow-Up: At [facility], you and your health needs are our priority.  As part of our continuing mission to provide you with exceptional heart care, we have created designated Provider Care Teams.  These Care Teams include your primary Cardiologist (physician) and Advanced Practice Providers (APPs -  Physician Assistants and Nurse Practitioners) who all work together to  provide you with the care you need, when you need it.  We recommend signing up for the patient portal called "MyChart".  Sign up information is provided on this After Visit Summary.  MyChart is used to connect with patients for Virtual Visits (Telemedicine).  Patients are able to view lab/test results, encounter notes, upcoming appointments,  etc.  Non-urgent messages can be sent to your provider as well.   To learn more about what you can do with MyChart, go to [email or web address].    Your next appointment:   April 8 @10 :20  Provider:   Little Ishikawa, MD     Other Instructions ECHO  Your physician has requested that you have an echocardiogram. Echocardiography is a painless test that uses sound waves to create images of your heart. It provides your doctor with information about the size and shape of your heart and how well your heart's chambers and valves are working. This procedure takes approximately one hour. There are no restrictions for this procedure. Please do NOT wear cologne, perfume, aftershave, or lotions (deodorant is allowed). Please arrive 15 minutes prior to your appointment time.  Please note: We ask at that you not bring children with you during ultrasound (echo/ vascular) testing. Due to room size and safety concerns, children are not allowed in the ultrasound rooms during exams. Our front office staff cannot provide observation of children in our lobby area while testing is being conducted. An adult accompanying a patient to their appointment will only be allowed in the ultrasound room at the discretion of the ultrasound technician under special circumstances. We apologize for any inconvenience.         Signed, Little Ishikawa, MD  05/10/2023 5:53 PM    [facility]

## 2023-05-11 ENCOUNTER — Ambulatory Visit: Payer: Medicare Other | Admitting: Cardiology

## 2023-05-11 DIAGNOSIS — I10 Essential (primary) hypertension: Secondary | ICD-10-CM | POA: Diagnosis not present

## 2023-05-11 LAB — CBC

## 2023-05-12 ENCOUNTER — Telehealth: Payer: Self-pay | Admitting: *Deleted

## 2023-05-12 LAB — BASIC METABOLIC PANEL
BUN/Creatinine Ratio: 19 (ref 10–24)
BUN: 20 mg/dL (ref 8–27)
CO2: 24 mmol/L (ref 20–29)
Calcium: 9.3 mg/dL (ref 8.6–10.2)
Chloride: 109 mmol/L — ABNORMAL HIGH (ref 96–106)
Creatinine, Ser: 1.04 mg/dL (ref 0.76–1.27)
Glucose: 103 mg/dL — ABNORMAL HIGH (ref 70–99)
Potassium: 5.2 mmol/L (ref 3.5–5.2)
Sodium: 144 mmol/L (ref 134–144)
eGFR: 77 mL/min/{1.73_m2} (ref >59)

## 2023-05-12 LAB — CBC
Hematocrit: 43.7 % (ref 37.5–51.0)
Hemoglobin: 14.2 g/dL (ref 13.0–17.7)
MCH: 30.9 pg (ref 26.6–33.0)
MCHC: 32.5 g/dL (ref 31.5–35.7)
MCV: 95 fL (ref 79–97)
Platelets: 240 10*3/uL (ref 150–450)
RBC: 4.6 x10E6/uL (ref 4.14–5.80)
RDW: 12.1 % (ref 11.6–15.4)
WBC: 4.9 10*3/uL (ref 3.4–10.8)

## 2023-05-12 NOTE — Telephone Encounter (Addendum)
 Cardiac Catheterization scheduled at [facility] for: Friday May 13, 2023 1:30 PM Arrival time [facility] Main Entrance A at: 11:30 AM  Nothing to eat after midnight prior to procedure, clear liquids until 5 AM day of procedure.  Medication instructions: -Hold:  Xarelto-none 05/11/23 until post procedure -Other usual morning medications can be taken with sips of water including aspirin 81 mg.  Plan to go home the same day, you will only stay overnight if medically necessary.  You must have responsible adult to drive you home.  Someone must be with you the first 24 hours after you arrive home.  Reviewed procedure instructions with patient

## 2023-05-12 NOTE — Telephone Encounter (Signed)
   Name: Jose Bowers  DOB: [birth year]  MRN: [id number]  Primary Cardiologist: Little Ishikawa, MD  Chart reviewed as part of pre-operative protocol coverage. The patient has an upcoming visit scheduled with Dr. Bjorn Pippin on 05/30/2023 at which time clearance can be addressed in case there are any issues that would impact surgical recommendations.  Left shoulder scope,rotator cuff repair is not scheduled until TBD as below. I added preop FYI to appointment note so that provider is aware to address at time of outpatient visit.  Per office protocol the cardiology provider should forward their finalized clearance decision and recommendations regarding antiplatelet therapy to the requesting party below.    I will route this message as FYI to requesting party and remove this message from the preop box as separate preop APP input not needed at this time.   Please call with any questions.  Joylene Grapes, NP  05/12/2023, 2:54 PM

## 2023-05-13 ENCOUNTER — Other Ambulatory Visit: Payer: Self-pay

## 2023-05-13 ENCOUNTER — Encounter (HOSPITAL_COMMUNITY): Payer: Self-pay | Admitting: Internal Medicine

## 2023-05-13 ENCOUNTER — Ambulatory Visit (HOSPITAL_COMMUNITY)
Admission: RE | Admit: 2023-05-13 | Discharge: 2023-05-13 | Disposition: A | Discharge status: Home / Self Care | Payer: Medicare Other | Attending: Internal Medicine | Admitting: Internal Medicine

## 2023-05-13 ENCOUNTER — Encounter (HOSPITAL_COMMUNITY)
Admission: RE | Disposition: A | Discharge status: Home / Self Care | Payer: Self-pay | Source: Home / Self Care | Attending: Internal Medicine

## 2023-05-13 DIAGNOSIS — I2510 Atherosclerotic heart disease of native coronary artery without angina pectoris: Secondary | ICD-10-CM | POA: Diagnosis not present

## 2023-05-13 DIAGNOSIS — R7303 Prediabetes: Secondary | ICD-10-CM | POA: Diagnosis not present

## 2023-05-13 DIAGNOSIS — Z7982 Long term (current) use of aspirin: Secondary | ICD-10-CM | POA: Insufficient documentation

## 2023-05-13 DIAGNOSIS — Z951 Presence of aortocoronary bypass graft: Secondary | ICD-10-CM | POA: Insufficient documentation

## 2023-05-13 DIAGNOSIS — Z79899 Other long term (current) drug therapy: Secondary | ICD-10-CM | POA: Insufficient documentation

## 2023-05-13 DIAGNOSIS — Z86711 Personal history of pulmonary embolism: Secondary | ICD-10-CM | POA: Insufficient documentation

## 2023-05-13 DIAGNOSIS — Z86718 Personal history of other venous thrombosis and embolism: Secondary | ICD-10-CM | POA: Diagnosis not present

## 2023-05-13 DIAGNOSIS — I10 Essential (primary) hypertension: Secondary | ICD-10-CM | POA: Insufficient documentation

## 2023-05-13 DIAGNOSIS — E785 Hyperlipidemia, unspecified: Secondary | ICD-10-CM | POA: Insufficient documentation

## 2023-05-13 DIAGNOSIS — D6851 Activated protein C resistance: Secondary | ICD-10-CM | POA: Insufficient documentation

## 2023-05-13 DIAGNOSIS — Z7901 Long term (current) use of anticoagulants: Secondary | ICD-10-CM | POA: Insufficient documentation

## 2023-05-13 DIAGNOSIS — R9439 Abnormal result of other cardiovascular function study: Secondary | ICD-10-CM | POA: Diagnosis not present

## 2023-05-13 HISTORY — PX: LEFT HEART CATH AND CORS/GRAFTS ANGIOGRAPHY: CATH118250

## 2023-05-13 SURGERY — LEFT HEART CATH AND CORS/GRAFTS ANGIOGRAPHY
Anesthesia: LOCAL

## 2023-05-13 MED ORDER — SODIUM CHLORIDE 0.9 % IV SOLN
INTRAVENOUS | Status: DC | PRN
  Administered 2023-05-13: 10 mL/h via INTRAVENOUS

## 2023-05-13 MED ORDER — HEPARIN SODIUM (PORCINE) 1000 UNIT/ML IJ SOLN
INTRAMUSCULAR | Status: AC
  Filled 2023-05-13: qty 10

## 2023-05-13 MED ORDER — SODIUM CHLORIDE 0.9 % WEIGHT BASED INFUSION: 3.0000 mL/kg/h | INTRAVENOUS | Status: DC

## 2023-05-13 MED ORDER — ACETAMINOPHEN 325 MG PO TABS: 650.0000 mg | ORAL_TABLET | ORAL | Status: DC | PRN

## 2023-05-13 MED ORDER — VERAPAMIL HCL 2.5 MG/ML IV SOLN
INTRAVENOUS | Status: AC
  Filled 2023-05-13: qty 2

## 2023-05-13 MED ORDER — LABETALOL HCL 5 MG/ML IV SOLN: 10.0000 mg | INTRAVENOUS | Status: DC | PRN

## 2023-05-13 MED ORDER — SODIUM CHLORIDE 0.9% FLUSH: 3.0000 mL | Freq: Two times a day (BID) | INTRAVENOUS | Status: DC

## 2023-05-13 MED ORDER — SODIUM CHLORIDE 0.9 % IV SOLN: 250.0000 mL | INTRAVENOUS | Status: DC | PRN

## 2023-05-13 MED ORDER — FENTANYL CITRATE (PF) 100 MCG/2ML IJ SOLN
INTRAMUSCULAR | Status: DC | PRN
  Administered 2023-05-13: 25 ug via INTRAVENOUS

## 2023-05-13 MED ORDER — MIDAZOLAM HCL 2 MG/2ML IJ SOLN
INTRAMUSCULAR | Status: AC
  Filled 2023-05-13: qty 2

## 2023-05-13 MED ORDER — SODIUM CHLORIDE 0.9% FLUSH: 3.0000 mL | INTRAVENOUS | Status: DC | PRN

## 2023-05-13 MED ORDER — IOHEXOL 350 MG/ML SOLN
INTRAVENOUS | Status: DC | PRN
  Administered 2023-05-13: 74 mL via INTRA_ARTERIAL

## 2023-05-13 MED ORDER — ASPIRIN 81 MG PO CHEW: 81.0000 mg | CHEWABLE_TABLET | ORAL | Status: DC

## 2023-05-13 MED ORDER — HEPARIN SODIUM (PORCINE) 1000 UNIT/ML IJ SOLN
INTRAMUSCULAR | Status: DC | PRN
  Administered 2023-05-13: 5000 [IU] via INTRA_ARTERIAL

## 2023-05-13 MED ORDER — FENTANYL CITRATE (PF) 100 MCG/2ML IJ SOLN
INTRAMUSCULAR | Status: AC
Start: 2023-05-13 — End: ?
  Filled 2023-05-13: qty 2

## 2023-05-13 MED ORDER — LIDOCAINE HCL (PF) 1 % IJ SOLN
INTRAMUSCULAR | Status: DC | PRN
  Administered 2023-05-13: 2 mL

## 2023-05-13 MED ORDER — ONDANSETRON HCL 4 MG/2ML IJ SOLN: 4.0000 mg | Freq: Four times a day (QID) | INTRAMUSCULAR | Status: DC | PRN

## 2023-05-13 MED ORDER — MIDAZOLAM HCL 2 MG/2ML IJ SOLN
INTRAMUSCULAR | Status: DC | PRN
  Administered 2023-05-13: 1 mg via INTRAVENOUS

## 2023-05-13 MED ORDER — VERAPAMIL HCL 2.5 MG/ML IV SOLN
INTRAVENOUS | Status: DC | PRN
  Administered 2023-05-13: 10 mL via INTRA_ARTERIAL

## 2023-05-13 MED ORDER — SODIUM CHLORIDE 0.9 % WEIGHT BASED INFUSION: 1.0000 mL/kg/h | INTRAVENOUS | Status: DC

## 2023-05-13 MED ORDER — HYDRALAZINE HCL 20 MG/ML IJ SOLN: 10.0000 mg | INTRAMUSCULAR | Status: DC | PRN

## 2023-05-13 MED ORDER — HEPARIN (PORCINE) IN NACL 1000-0.9 UT/500ML-% IV SOLN
INTRAVENOUS | Status: DC | PRN
  Administered 2023-05-13 (×2): 500 mL via INTRA_ARTERIAL

## 2023-05-13 SURGICAL SUPPLY — 8 items
CATH INFINITI 6F ANG MULTIPACK (CATHETERS) IMPLANT
COVER DOME SNAP 22 D (MISCELLANEOUS) IMPLANT
DEVICE RAD COMP TR BAND LRG (VASCULAR PRODUCTS) IMPLANT
ELECT DEFIB PAD ADLT CADENCE (PAD) IMPLANT
GLIDESHEATH SLEND SS 6F .021 (SHEATH) IMPLANT
PACK CARDIAC CATHETERIZATION (CUSTOM PROCEDURE TRAY) ×1 IMPLANT
SET ATX-X65L (MISCELLANEOUS) IMPLANT
WIRE EMERALD 3MM-J .035X260CM (WIRE) IMPLANT

## 2023-05-13 NOTE — Interval H&P Note (Signed)
 History and Physical Interval Note:  05/13/2023 12:50 PM  Jose Bowers  has presented today for surgery, with the diagnosis of chest pain.  The various methods of treatment have been discussed with the patient and family. After consideration of risks, benefits and other options for treatment, the patient has consented to  Procedure(s): LEFT HEART CATH AND CORS/GRAFTS ANGIOGRAPHY (N/A) as a surgical intervention.  The patient's history has been reviewed, patient examined, no change in status, stable for surgery.  I have reviewed the patient's chart and labs.  Questions were answered to the patient's satisfaction.     [name]

## 2023-05-13 NOTE — Discharge Instructions (Addendum)
 Restart Xarelto on 05/14/23

## 2023-05-13 NOTE — Interval H&P Note (Signed)
 History and Physical Interval Note:  05/13/2023 12:31 PM  Jose Bowers  has presented today for surgery, with the diagnosis of chest pain.  The various methods of treatment have been discussed with the patient and family. After consideration of risks, benefits and other options for treatment, the patient has consented to  Procedure(s): LEFT HEART CATH AND CORS/GRAFTS ANGIOGRAPHY (N/A) as a surgical intervention.  The patient's history has been reviewed, patient examined, no change in status, stable for surgery.  I have reviewed the patient's chart and labs.  Questions were answered to the patient's satisfaction.     [name]

## 2023-05-16 ENCOUNTER — Encounter: Payer: Self-pay | Admitting: Cardiology

## 2023-05-17 ENCOUNTER — Other Ambulatory Visit (HOSPITAL_COMMUNITY): Payer: Medicare Other

## 2023-05-19 NOTE — Telephone Encounter (Signed)
 I spoke with patient this evening, discussed plan.  Has upcoming appointment on 3/17

## 2023-05-29 NOTE — H&P (View-Only) (Signed)
 Cardiology Office Note:    Date:  05/30/2023   ID:  Jose Bowers, DOB [birth year], MRN [id number]  PCP:  Georgann Housekeeper, MD  Cardiologist:  Little Ishikawa, MD  Electrophysiologist:  None   Referring MD: Georgann Housekeeper, MD   Chief Complaint  Patient presents with   Coronary Artery Disease    History of Present Illness:    Jose Bowers is a 73 y.o. male with a hx of CAD status post CABG (LIMA-LAD, SVG-OM, SVG sequential to PDA and PLB) factor V Leiden, hypertension, hyperlipidemia, DVT following shoulder surgery in 2014, saddle PE treated with EKOS in 2019 who presents for follow-up.  He was referred by Dr. Donette Larry for evaluation of chest pain, initially seen on 09/19/2019.  Exercise Myoview was attempted on 10/04/2019.  He did not reach target heart rate on treadmill and was converted to pharmacologic study.  Despite not reaching target heart rate, he had 1.5 mm horizontal ST depressions in inferior and anterolateral leads and symptoms of chest pain.  MPI imaging showed reversible defect in apical anterior wall and apex consistent with LAD ischemia.  LVEF 49%.  Cardiac catheterization on 10/16/2019 showed severe heavily calcified multivessel disease (99% proximal LAD, 90% proximal circumflex, 80% distal RCA, 90% RPDA).  Underwent CABG 10/18/19 ((LIMA-LAD, SVG-OM, SVG sequential to PDA and PLB).  Echocardiogram 10/16/2019 showed normal biventricular function, no significant valvular disease.  Reported chest pain and underwent stress PET 05/05/2023, which showed large reversible severe lateral wall perfusion defect; high risk study.  LHC on 05/13/2023 showed patent LIMA-LAD, occluded SVG-OM and occluded sequential SVG to PDA to PLV, occluded proximal LAD, high-grade proximal LCx disease, moderate distal RCA disease.  Since last clinic visit, he reports he is doing okay.  Does report has been having chest pain, that can occur with exertion or when he is under stress.  States that it has not been  limiting him, he has been able to do the things he wants to do.  Has not had to take any nitroglycerin.  Wt Readings from Last 3 Encounters:  05/30/23 198 lb 12.8 oz (90.2 kg)  05/13/23 195 lb (88.5 kg)  05/10/23 197 lb 6.4 oz (89.5 kg)    BP Readings from Last 3 Encounters:  05/30/23 130/66  05/13/23 133/79  05/10/23 121/72     Past Medical History:  Diagnosis Date   Factor V Leiden (HCC)    Hypertension     Past Surgical History:  Procedure Laterality Date   CORONARY ARTERY BYPASS GRAFT N/A 10/18/2019   Procedure: CORONARY ARTERY BYPASS GRAFTING (CABG), ON PUMP, TIMES FOUR, USING LEFT INTERAL MAMMARY ATERY AND ENDOSCOPICALLY HARVESTED RIGHT GREATER SAPHENOUS VEIN;  Surgeon: Alleen Borne, MD;  Location: [facility] OR;  Service: Open Heart Surgery;  Laterality: N/A;   HERNIA REPAIR     IR ANGIOGRAM PULMONARY BILATERAL SELECTIVE  03/19/2017   IR ANGIOGRAM SELECTIVE EACH ADDITIONAL VESSEL  03/19/2017   IR ANGIOGRAM SELECTIVE EACH ADDITIONAL VESSEL  03/19/2017   IR INFUSION THROMBOL ARTERIAL INITIAL (MS)  03/19/2017   IR INFUSION THROMBOL ARTERIAL INITIAL (MS)  03/19/2017   IR THROMB F/U EVAL ART/VEN FINAL DAY (MS)  03/20/2017   IR US GUIDE VASC ACCESS RIGHT  03/19/2017   JOINT REPLACEMENT     LEFT HEART CATH AND CORONARY ANGIOGRAPHY N/A 10/16/2019   Procedure: LEFT HEART CATH AND CORONARY ANGIOGRAPHY;  Surgeon: Marykay Lex, MD;  Location: [facility] INVASIVE CV LAB;  Service: Cardiovascular;  Laterality: N/A;   LEFT HEART CATH  AND CORS/GRAFTS ANGIOGRAPHY N/A 05/13/2023   Procedure: LEFT HEART CATH AND CORS/GRAFTS ANGIOGRAPHY;  Surgeon: [name], MD;  Location: [facility] INVASIVE CV LAB;  Service: Cardiovascular;  Laterality: N/A;   ROTATOR CUFF REPAIR     TEE WITHOUT CARDIOVERSION N/A 10/18/2019   Procedure: TRANSESOPHAGEAL ECHOCARDIOGRAM (TEE);  Surgeon: Alleen Borne, MD;  Location: [facility] OR;  Service: Open Heart Surgery;  Laterality: N/A;    Current Medications: Current Meds  Medication Sig    metoprolol succinate (TOPROL-XL) 50 MG 24 hr tablet Take 1 tablet (50 mg total) by mouth daily. Take with or immediately following a meal.     Allergies:   Patient has no known allergies.   Social History   Socioeconomic History   Marital status: Married    Spouse name: Not on file   Number of children: Not on file   Years of education: Not on file   Highest education level: Not on file  Occupational History   Occupation: retired     Comment: Dentist   Tobacco Use   Smoking status: Never   Smokeless tobacco: Never  Vaping Use   Vaping status: Never Used  Substance and Sexual Activity   Alcohol use: Yes    Comment: "few times a week"   Drug use: No   Sexual activity: Not on file  Other Topics Concern   Not on file  Social History Narrative   Not on file   Social Drivers of Health   Financial Resource Strain: Not on file  Food Insecurity: Not on file  Transportation Needs: Not on file  Physical Activity: Not on file  Stress: Not on file  Social Connections: Not on file     Family History: The patient's family history includes Clotting disorder in his brother, father, grandchild, and son.  ROS:   Please see the history of present illness.     All other systems reviewed and are negative.  EKGs/Labs/Other Studies Reviewed:    The following studies were reviewed today:  Intraoperative TEE 10/18/2019: - Left Ventricle: The left ventricle is essentially unchanged from  pre-bypass.  - Right Ventricle: The right ventricle appears unchanged from pre-bypass.  - Aorta: The aorta appears unchanged from pre-bypass.  - Left Atrium: The left atrium appears unchanged from pre-bypass.  - Left Atrial Appendage: The left atrial appendage appears unchanged from  pre-bypass.  - Aortic Valve: The aortic valve appears unchanged from pre-bypass.  - Mitral Valve: The mitral valve appears essentially unchanged from  pre-bypass.  - Tricuspid Valve: The tricuspid valve  appears essentially unchanged from  pre-bypass.  - Interatrial Septum: The interatrial septum appears unchanged from  pre-bypass.  - Interventricular Septum: The interventricular septum appears unchanged  from  pre-bypass.  - Pericardium: The pericardium appears unchanged from pre-bypass.   US Doppler Pre CABG 10/17/2019: Right Carotid: Velocities in the right ICA are consistent with a 1-39%  stenosis.  Left Carotid: Velocities in the left ICA are consistent with a 1-39%  stenosis.  Vertebrals:  Bilateral vertebral arteries demonstrate antegrade flow.  Subclavians: Normal flow hemodynamics were seen in bilateral subclavian arteries.  Right Upper Extremity: Doppler waveforms remain within normal limits with right radial compression. Doppler waveforms remain within normal limits with right ulnar compression.  Left Upper Extremity: Doppler waveforms remain within normal limits with left radial compression. Doppler waveforms remain within normal limits with left ulnar compression.   Echo 10/16/2019:  1. Left ventricular ejection fraction, by estimation, is 55 to 60%.  The  left ventricle has normal function. The left ventricle has no regional  wall motion abnormalities. There is mild left ventricular hypertrophy of  the basal-septal segment. Left  ventricular diastolic parameters are consistent with Grade I diastolic  dysfunction (impaired relaxation).   2. Right ventricular systolic function is normal. The right ventricular  size is normal. Tricuspid regurgitation signal is inadequate for assessing  PA pressure.   3. Left atrial size was mildly dilated.   4. The mitral valve is normal in structure. No evidence of mitral valve  regurgitation. No evidence of mitral stenosis.   5. The aortic valve is normal in structure. Aortic valve regurgitation is  not visualized. No aortic stenosis is present.   6. Aortic dilatation noted. There is borderline dilatation of the aortic  root measuring 38  mm.   7. The inferior vena cava is normal in size with greater than 50%  respiratory variability, suggesting right atrial pressure of 3 mmHg.   Comparison(s): No significant change from prior study. Prior images  reviewed side by side.   LHC 10/16/2019: -- LEFT VENTRICULAR HEMODYNAMICS & VENTRICULOGRAPHY There is mild left ventricular systolic dysfunction. LV end diastolic pressure is normal. The left ventricular ejection fraction is 45-50% by visual estimate. -- CORONARY ANGIOGRAPHY Prox LAD to Mid LAD lesion is 99% stenosed with 50% stenosed side branch in 1st Diag. Prox Cx lesion is 90% stenosed. Dist RCA lesion is 80% stenosed with 90% stenosed side branch in RPDA. SUMMARY  Severe heavily calcified multivessel disease: Heavily calcified proximal LAD subtotal occlusion (99%) lesion beginning just prior to major SP trunk, terminating after 1st Diag --> remainder the LAD is relatively free of disease with TIMI I flow and competitive flow from right left collaterals to the apex Heavily calcified very focal 90% eccentric stenosis in the proximal LCx--LCx gives off 2 major Mrg branches and a posterior AV groove branch with 2 small PL branches--free of disease. Heavily calcified dominant RCA with diffuse 30% mid to distal disease, and 80% focal stenosis just prior to RPAV continuing into the RPDA with 90% ostial PDA lesion. Mildly reduced LVEF of roughly 45% with mid to apical anterior hypokinesis RECOMMENDATION Admit under Dr. Campbell Lerner service for medication optimization and CVTS consultation He will be started on IV Heparin while we continue to hold his Xarelto. Increase to Max dose Atorvastatin & adjust BP medications    Lexiscan Stress Test 10/04/2019: Nuclear stress EF: 49%. The left ventricular ejection fraction is mildly decreased (45-54%). Horizontal ST segment depression ST segment depression of 1.5 mm was noted during stress in the II, III, aVF, V5 and V6 leads, beginning at 7  minutes of stress, and returning to baseline after less than 1 minute of recovery. Findings consistent with ischemia. This is a high risk study.   1. Initial ECG treadmill stress did not reach target HR and was converted to pharmacological study. Despite not reaching target HR, the patient had 1.5 mm horizontal ST depression in inferior leads (II, III, AVF) and anterolateral leads (V5-6) and symptoms of chest pain. 2. On MPI imaging, there is a medium size (10-12% of the LV), moderate perfusion defect present on stress imaging in the apical anterior, and apex consistent with ischemia in the LAD distrubution.  3. LVEF is normal for this modality, 49%.  4. Average exercise capacity (8:11 min:s; 7.0 METS). 5. Overall, this is a high risk study concerning for ischemia.  EKG:   03/28/2023: Normal sinus rhythm, rate 62 10/14/22: Normal sinus  rhythm, rate 62, no ST abnormalities 02/20/21: NSR, rate 81, no ST abnormalities 08/18/2020: NSR, rate 57, No ST abnormalities 11/16/2019: normal sinus rhythm, rate 63, no ST abnormalities 10/09/2019: NSR, rate 72, no ST abnormalities  Recent Labs: 05/11/2023: BUN 20; Creatinine, Ser 1.04; Hemoglobin 14.2; Platelets 240; Potassium 5.2; Sodium 144  Recent Lipid Panel    Component Value Date/Time   CHOL 114 01/18/2020 0845   TRIG 74 01/18/2020 0845   HDL 48 01/18/2020 0845   CHOLHDL 2.4 01/18/2020 0845   CHOLHDL 3.6 10/18/2019 0511   VLDL 24 10/18/2019 0511   LDLCALC 51 01/18/2020 0845    Physical Exam:    VS:  BP 130/66 (BP Location: Left Arm, Patient Position: Sitting, Cuff Size: Normal)   Pulse 73   Wt 198 lb 12.8 oz (90.2 kg)   SpO2 95%   BMI 28.52 kg/m     Wt Readings from Last 3 Encounters:  05/30/23 198 lb 12.8 oz (90.2 kg)  05/13/23 195 lb (88.5 kg)  05/10/23 197 lb 6.4 oz (89.5 kg)     GEN: in no acute distress HEENT: Normal NECK: No JVD; No carotid bruits CARDIAC: RRR, no murmurs, rubs, gallops RESPIRATORY:  Clear to auscultation  without rales, wheezing or rhonchi  ABDOMEN: Soft, non-tender, non-distended MUSCULOSKELETAL:  No edema SKIN: Warm and dry NEUROLOGIC:  Alert and oriented x 3 PSYCHIATRIC:  Normal affect   ASSESSMENT:    1. Coronary artery disease of native artery of native heart with stable angina pectoris (HCC)   2. S/P CABG (coronary artery bypass graft)   3. Pre-op evaluation   4. Essential hypertension   5. Hyperlipidemia, unspecified hyperlipidemia type       PLAN:    CAD: Cardiac catheterization on 10/16/2019 showed severe heavily calcified multivessel disease (99% proximal LAD, 90% proximal circumflex, 80% distal RCA, 90% RPDA).  Underwent CABG 10/18/19 (LIMA-LAD, SVG-OM, SVG sequential to PDA and PLB).  Echocardiogram on 10/16/2019 showed normal biventricular function, no significant valvular disease.  Reported chest pain and underwent stress PET 05/05/2023, which showed large reversible severe lateral wall perfusion defect; high risk study.  LHC on 05/13/2023 showed patent LIMA-LAD, occluded SVG-OM and occluded sequential SVG to PDA to PLV, occluded proximal LAD, high-grade proximal LCx disease, moderate distal RCA disease. -Discussed LHC results with Dr. Lynnette Caffey in interventional cardiology.  Could consider intervention to LCx lesion based on symptoms.  He is planning upcoming shoulder surgery for torn rotator cuff.  Intervention would require delaying surgery x 6 months.  In discussing with patient and Dr Lynnette Caffey, recommendation is to hold off on PCI for now and monitor symptoms.  If having chest pain despite medical management can proceed with PCI. -Continue Xarelto.  While holding for shoulder surgery would start aspirin 81 mg daily -Continue atorvastatin 80 mg daily -Continue Toprol-XL, will increase dose to 50 mg -As needed SL NTG -Mild systolic dysfunction on PET, will update echocardiogram  Preop evaluation: Reports had a fall and tore rotator cuff in left shoulder, states that he was seen by  orthopedist and recommended surgery.  Given stress PET findings as above, LHC was done on 05/13/2023 which showed occluded vein grafts and high-grade proximal LCx stenosis.  As discussed above, recommend medical management for now and holding off on PCI.  Will update echocardiogram as above prior to surgery.    Hypertension: continue losartan 50 mg daily and Toprol-XL, will increase toprol XL to 50 mg daily as above   Hyperlipidemia: On atorvastatin 80 mg daily,  LDL 39 on 06/04/22   Factor V Leiden: History of DVT and PE.  On Xarelto     Prediabetes: A1c 5.9% on 06/04/2022   RTC in 1 month   Medication Adjustments/Labs and Tests Ordered: Current medicines are reviewed at length with the patient today.  Concerns regarding medicines are outlined above.  No orders of the defined types were placed in this encounter.   Meds ordered this encounter  Medications   metoprolol succinate (TOPROL-XL) 50 MG 24 hr tablet    Sig: Take 1 tablet (50 mg total) by mouth daily. Take with or immediately following a meal.    Dispense:  90 tablet    Refill:  3     Patient Instructions  Medication Instructions:  INCREASE Metoprolol succinate (TOPROL-XL) to 50 mg daily *If you need a refill on your cardiac medications before your next appointment, please call your pharmacy*   Follow-Up: At [facility], you and your health needs are our priority.  As part of our continuing mission to provide you with exceptional heart care, we have created designated Provider Care Teams.  These Care Teams include your primary Cardiologist (physician) and Advanced Practice Providers (APPs -  Physician Assistants and Nurse Practitioners) who all work together to provide you with the care you need, when you need it.   Your next appointment:   Keep appointment : Tuesday 06/21/2023 at 10:20 am  Provider:   Little Ishikawa, MD     Other Instructions       Signed, Little Ishikawa, MD  05/30/2023  1:08 PM    [facility]

## 2023-05-29 NOTE — Progress Notes (Unsigned)
 Cardiology Office Note:    Date:  05/30/2023   ID:  [name], DOB [birth year], MRN [id number]  PCP:  Georgann Housekeeper, MD  Cardiologist:  Little Ishikawa, MD  Electrophysiologist:  None   Referring MD: Georgann Housekeeper, MD   Chief Complaint  Patient presents with   Coronary Artery Disease    History of Present Illness:    Jose Bowers is a 73 y.o. male with a hx of CAD status post CABG (LIMA-LAD, SVG-OM, SVG sequential to PDA and PLB) factor V Leiden, hypertension, hyperlipidemia, DVT following shoulder surgery in 2014, saddle PE treated with EKOS in 2019 who presents for follow-up.  He was referred by Dr. Donette Larry for evaluation of chest pain, initially seen on 09/19/2019.  Exercise Myoview was attempted on 10/04/2019.  He did not reach target heart rate on treadmill and was converted to pharmacologic study.  Despite not reaching target heart rate, he had 1.5 mm horizontal ST depressions in inferior and anterolateral leads and symptoms of chest pain.  MPI imaging showed reversible defect in apical anterior wall and apex consistent with LAD ischemia.  LVEF 49%.  Cardiac catheterization on 10/16/2019 showed severe heavily calcified multivessel disease (99% proximal LAD, 90% proximal circumflex, 80% distal RCA, 90% RPDA).  Underwent CABG 10/18/19 ((LIMA-LAD, SVG-OM, SVG sequential to PDA and PLB).  Echocardiogram 10/16/2019 showed normal biventricular function, no significant valvular disease.  Reported chest pain and underwent stress PET 05/05/2023, which showed large reversible severe lateral wall perfusion defect; high risk study.  LHC on 05/13/2023 showed patent LIMA-LAD, occluded SVG-OM and occluded sequential SVG to PDA to PLV, occluded proximal LAD, high-grade proximal LCx disease, moderate distal RCA disease.  Since last clinic visit, he reports he is doing okay.  Does report has been having chest pain, that can occur with exertion or when he is under stress.  States that it has not been  limiting him, he has been able to do the things he wants to do.  Has not had to take any nitroglycerin.  Wt Readings from Last 3 Encounters:  05/30/23 198 lb 12.8 oz (90.2 kg)  05/13/23 195 lb (88.5 kg)  05/10/23 197 lb 6.4 oz (89.5 kg)    BP Readings from Last 3 Encounters:  05/30/23 130/66  05/13/23 133/79  05/10/23 121/72     Past Medical History:  Diagnosis Date   Factor V Leiden (HCC)    Hypertension     Past Surgical History:  Procedure Laterality Date   CORONARY ARTERY BYPASS GRAFT N/A 10/18/2019   Procedure: CORONARY ARTERY BYPASS GRAFTING (CABG), ON PUMP, TIMES FOUR, USING LEFT INTERAL MAMMARY ATERY AND ENDOSCOPICALLY HARVESTED RIGHT GREATER SAPHENOUS VEIN;  Surgeon: Alleen Borne, MD;  Location: [facility] OR;  Service: Open Heart Surgery;  Laterality: N/A;   HERNIA REPAIR     IR ANGIOGRAM PULMONARY BILATERAL SELECTIVE  03/19/2017   IR ANGIOGRAM SELECTIVE EACH ADDITIONAL VESSEL  03/19/2017   IR ANGIOGRAM SELECTIVE EACH ADDITIONAL VESSEL  03/19/2017   IR INFUSION THROMBOL ARTERIAL INITIAL (MS)  03/19/2017   IR INFUSION THROMBOL ARTERIAL INITIAL (MS)  03/19/2017   IR THROMB F/U EVAL ART/VEN FINAL DAY (MS)  03/20/2017   IR US GUIDE VASC ACCESS RIGHT  03/19/2017   JOINT REPLACEMENT     LEFT HEART CATH AND CORONARY ANGIOGRAPHY N/A 10/16/2019   Procedure: LEFT HEART CATH AND CORONARY ANGIOGRAPHY;  Surgeon: Marykay Lex, MD;  Location: [facility] INVASIVE CV LAB;  Service: Cardiovascular;  Laterality: N/A;   LEFT HEART CATH  AND CORS/GRAFTS ANGIOGRAPHY N/A 05/13/2023   Procedure: LEFT HEART CATH AND CORS/GRAFTS ANGIOGRAPHY;  Surgeon: [name], MD;  Location: [facility] INVASIVE CV LAB;  Service: Cardiovascular;  Laterality: N/A;   ROTATOR CUFF REPAIR     TEE WITHOUT CARDIOVERSION N/A 10/18/2019   Procedure: TRANSESOPHAGEAL ECHOCARDIOGRAM (TEE);  Surgeon: Alleen Borne, MD;  Location: [facility] OR;  Service: Open Heart Surgery;  Laterality: N/A;    Current Medications: Current Meds  Medication Sig    metoprolol succinate (TOPROL-XL) 50 MG 24 hr tablet Take 1 tablet (50 mg total) by mouth daily. Take with or immediately following a meal.     Allergies:   Patient has no known allergies.   Social History   Socioeconomic History   Marital status: Married    Spouse name: Not on file   Number of children: Not on file   Years of education: Not on file   Highest education level: Not on file  Occupational History   Occupation: retired     Comment: Dentist   Tobacco Use   Smoking status: Never   Smokeless tobacco: Never  Vaping Use   Vaping status: Never Used  Substance and Sexual Activity   Alcohol use: Yes    Comment: "few times a week"   Drug use: No   Sexual activity: Not on file  Other Topics Concern   Not on file  Social History Narrative   Not on file   Social Drivers of Health   Financial Resource Strain: Not on file  Food Insecurity: Not on file  Transportation Needs: Not on file  Physical Activity: Not on file  Stress: Not on file  Social Connections: Not on file     Family History: The patient's family history includes Clotting disorder in his brother, father, grandchild, and son.  ROS:   Please see the history of present illness.     All other systems reviewed and are negative.  EKGs/Labs/Other Studies Reviewed:    The following studies were reviewed today:  Intraoperative TEE 10/18/2019: - Left Ventricle: The left ventricle is essentially unchanged from  pre-bypass.  - Right Ventricle: The right ventricle appears unchanged from pre-bypass.  - Aorta: The aorta appears unchanged from pre-bypass.  - Left Atrium: The left atrium appears unchanged from pre-bypass.  - Left Atrial Appendage: The left atrial appendage appears unchanged from  pre-bypass.  - Aortic Valve: The aortic valve appears unchanged from pre-bypass.  - Mitral Valve: The mitral valve appears essentially unchanged from  pre-bypass.  - Tricuspid Valve: The tricuspid valve  appears essentially unchanged from  pre-bypass.  - Interatrial Septum: The interatrial septum appears unchanged from  pre-bypass.  - Interventricular Septum: The interventricular septum appears unchanged  from  pre-bypass.  - Pericardium: The pericardium appears unchanged from pre-bypass.   US Doppler Pre CABG 10/17/2019: Right Carotid: Velocities in the right ICA are consistent with a 1-39%  stenosis.  Left Carotid: Velocities in the left ICA are consistent with a 1-39%  stenosis.  Vertebrals:  Bilateral vertebral arteries demonstrate antegrade flow.  Subclavians: Normal flow hemodynamics were seen in bilateral subclavian arteries.  Right Upper Extremity: Doppler waveforms remain within normal limits with right radial compression. Doppler waveforms remain within normal limits with right ulnar compression.  Left Upper Extremity: Doppler waveforms remain within normal limits with left radial compression. Doppler waveforms remain within normal limits with left ulnar compression.   Echo 10/16/2019:  1. Left ventricular ejection fraction, by estimation, is 55 to 60%.  The  left ventricle has normal function. The left ventricle has no regional  wall motion abnormalities. There is mild left ventricular hypertrophy of  the basal-septal segment. Left  ventricular diastolic parameters are consistent with Grade I diastolic  dysfunction (impaired relaxation).   2. Right ventricular systolic function is normal. The right ventricular  size is normal. Tricuspid regurgitation signal is inadequate for assessing  PA pressure.   3. Left atrial size was mildly dilated.   4. The mitral valve is normal in structure. No evidence of mitral valve  regurgitation. No evidence of mitral stenosis.   5. The aortic valve is normal in structure. Aortic valve regurgitation is  not visualized. No aortic stenosis is present.   6. Aortic dilatation noted. There is borderline dilatation of the aortic  root measuring 38  mm.   7. The inferior vena cava is normal in size with greater than 50%  respiratory variability, suggesting right atrial pressure of 3 mmHg.   Comparison(s): No significant change from prior study. Prior images  reviewed side by side.   LHC 10/16/2019: -- LEFT VENTRICULAR HEMODYNAMICS & VENTRICULOGRAPHY There is mild left ventricular systolic dysfunction. LV end diastolic pressure is normal. The left ventricular ejection fraction is 45-50% by visual estimate. -- CORONARY ANGIOGRAPHY Prox LAD to Mid LAD lesion is 99% stenosed with 50% stenosed side branch in 1st Diag. Prox Cx lesion is 90% stenosed. Dist RCA lesion is 80% stenosed with 90% stenosed side branch in RPDA. SUMMARY  Severe heavily calcified multivessel disease: Heavily calcified proximal LAD subtotal occlusion (99%) lesion beginning just prior to major SP trunk, terminating after 1st Diag --> remainder the LAD is relatively free of disease with TIMI I flow and competitive flow from right left collaterals to the apex Heavily calcified very focal 90% eccentric stenosis in the proximal LCx--LCx gives off 2 major Mrg branches and a posterior AV groove branch with 2 small PL branches--free of disease. Heavily calcified dominant RCA with diffuse 30% mid to distal disease, and 80% focal stenosis just prior to RPAV continuing into the RPDA with 90% ostial PDA lesion. Mildly reduced LVEF of roughly 45% with mid to apical anterior hypokinesis RECOMMENDATION Admit under Dr. Campbell Lerner service for medication optimization and CVTS consultation He will be started on IV Heparin while we continue to hold his Xarelto. Increase to Max dose Atorvastatin & adjust BP medications    Lexiscan Stress Test 10/04/2019: Nuclear stress EF: 49%. The left ventricular ejection fraction is mildly decreased (45-54%). Horizontal ST segment depression ST segment depression of 1.5 mm was noted during stress in the II, III, aVF, V5 and V6 leads, beginning at 7  minutes of stress, and returning to baseline after less than 1 minute of recovery. Findings consistent with ischemia. This is a high risk study.   1. Initial ECG treadmill stress did not reach target HR and was converted to pharmacological study. Despite not reaching target HR, the patient had 1.5 mm horizontal ST depression in inferior leads (II, III, AVF) and anterolateral leads (V5-6) and symptoms of chest pain. 2. On MPI imaging, there is a medium size (10-12% of the LV), moderate perfusion defect present on stress imaging in the apical anterior, and apex consistent with ischemia in the LAD distrubution.  3. LVEF is normal for this modality, 49%.  4. Average exercise capacity (8:11 min:s; 7.0 METS). 5. Overall, this is a high risk study concerning for ischemia.  EKG:   03/28/2023: Normal sinus rhythm, rate 62 10/14/22: Normal sinus  rhythm, rate 62, no ST abnormalities 02/20/21: NSR, rate 81, no ST abnormalities 08/18/2020: NSR, rate 57, No ST abnormalities 11/16/2019: normal sinus rhythm, rate 63, no ST abnormalities 10/09/2019: NSR, rate 72, no ST abnormalities  Recent Labs: 05/11/2023: BUN 20; Creatinine, Ser 1.04; Hemoglobin 14.2; Platelets 240; Potassium 5.2; Sodium 144  Recent Lipid Panel    Component Value Date/Time   CHOL 114 01/18/2020 0845   TRIG 74 01/18/2020 0845   HDL 48 01/18/2020 0845   CHOLHDL 2.4 01/18/2020 0845   CHOLHDL 3.6 10/18/2019 0511   VLDL 24 10/18/2019 0511   LDLCALC 51 01/18/2020 0845    Physical Exam:    VS:  BP 130/66 (BP Location: Left Arm, Patient Position: Sitting, Cuff Size: Normal)   Pulse 73   Wt 198 lb 12.8 oz (90.2 kg)   SpO2 95%   BMI 28.52 kg/m     Wt Readings from Last 3 Encounters:  05/30/23 198 lb 12.8 oz (90.2 kg)  05/13/23 195 lb (88.5 kg)  05/10/23 197 lb 6.4 oz (89.5 kg)     GEN: in no acute distress HEENT: Normal NECK: No JVD; No carotid bruits CARDIAC: RRR, no murmurs, rubs, gallops RESPIRATORY:  Clear to auscultation  without rales, wheezing or rhonchi  ABDOMEN: Soft, non-tender, non-distended MUSCULOSKELETAL:  No edema SKIN: Warm and dry NEUROLOGIC:  Alert and oriented x 3 PSYCHIATRIC:  Normal affect   ASSESSMENT:    1. Coronary artery disease of native artery of native heart with stable angina pectoris (HCC)   2. S/P CABG (coronary artery bypass graft)   3. Pre-op evaluation   4. Essential hypertension   5. Hyperlipidemia, unspecified hyperlipidemia type       PLAN:    CAD: Cardiac catheterization on 10/16/2019 showed severe heavily calcified multivessel disease (99% proximal LAD, 90% proximal circumflex, 80% distal RCA, 90% RPDA).  Underwent CABG 10/18/19 (LIMA-LAD, SVG-OM, SVG sequential to PDA and PLB).  Echocardiogram on 10/16/2019 showed normal biventricular function, no significant valvular disease.  Reported chest pain and underwent stress PET 05/05/2023, which showed large reversible severe lateral wall perfusion defect; high risk study.  LHC on 05/13/2023 showed patent LIMA-LAD, occluded SVG-OM and occluded sequential SVG to PDA to PLV, occluded proximal LAD, high-grade proximal LCx disease, moderate distal RCA disease. -Discussed LHC results with Dr. Lynnette Caffey in interventional cardiology.  Could consider intervention to LCx lesion based on symptoms.  He is planning upcoming shoulder surgery for torn rotator cuff.  Intervention would require delaying surgery x 6 months.  In discussing with patient and Dr Lynnette Caffey, recommendation is to hold off on PCI for now and monitor symptoms.  If having chest pain despite medical management can proceed with PCI. -Continue Xarelto.  While holding for shoulder surgery would start aspirin 81 mg daily -Continue atorvastatin 80 mg daily -Continue Toprol-XL, will increase dose to 50 mg -As needed SL NTG -Mild systolic dysfunction on PET, will update echocardiogram  Preop evaluation: Reports had a fall and tore rotator cuff in left shoulder, states that he was seen by  orthopedist and recommended surgery.  Given stress PET findings as above, LHC was done on 05/13/2023 which showed occluded vein grafts and high-grade proximal LCx stenosis.  As discussed above, recommend medical management for now and holding off on PCI.  Will update echocardiogram as above prior to surgery.    Hypertension: continue losartan 50 mg daily and Toprol-XL, will increase toprol XL to 50 mg daily as above   Hyperlipidemia: On atorvastatin 80 mg daily,  LDL 39 on 06/04/22   Factor V Leiden: History of DVT and PE.  On Xarelto     Prediabetes: A1c 5.9% on 06/04/2022   RTC in 1 month   Medication Adjustments/Labs and Tests Ordered: Current medicines are reviewed at length with the patient today.  Concerns regarding medicines are outlined above.  No orders of the defined types were placed in this encounter.   Meds ordered this encounter  Medications   metoprolol succinate (TOPROL-XL) 50 MG 24 hr tablet    Sig: Take 1 tablet (50 mg total) by mouth daily. Take with or immediately following a meal.    Dispense:  90 tablet    Refill:  3     Patient Instructions  Medication Instructions:  INCREASE Metoprolol succinate (TOPROL-XL) to 50 mg daily *If you need a refill on your cardiac medications before your next appointment, please call your pharmacy*   Follow-Up: At [facility], you and your health needs are our priority.  As part of our continuing mission to provide you with exceptional heart care, we have created designated Provider Care Teams.  These Care Teams include your primary Cardiologist (physician) and Advanced Practice Providers (APPs -  Physician Assistants and Nurse Practitioners) who all work together to provide you with the care you need, when you need it.   Your next appointment:   Keep appointment : Tuesday 06/21/2023 at 10:20 am  Provider:   Little Ishikawa, MD     Other Instructions       Signed, Little Ishikawa, MD  05/30/2023  1:08 PM    [facility]

## 2023-05-30 ENCOUNTER — Ambulatory Visit: Payer: Medicare Other | Attending: Cardiology | Admitting: Cardiology

## 2023-05-30 ENCOUNTER — Encounter: Payer: Self-pay | Admitting: Cardiology

## 2023-05-30 ENCOUNTER — Ambulatory Visit (HOSPITAL_BASED_OUTPATIENT_CLINIC_OR_DEPARTMENT_OTHER): Payer: Medicare Other

## 2023-05-30 VITALS — BP 130/66 | HR 73 | Wt 198.8 lb

## 2023-05-30 DIAGNOSIS — Z951 Presence of aortocoronary bypass graft: Secondary | ICD-10-CM | POA: Diagnosis not present

## 2023-05-30 DIAGNOSIS — I25118 Atherosclerotic heart disease of native coronary artery with other forms of angina pectoris: Secondary | ICD-10-CM | POA: Diagnosis not present

## 2023-05-30 DIAGNOSIS — R079 Chest pain, unspecified: Secondary | ICD-10-CM | POA: Diagnosis not present

## 2023-05-30 DIAGNOSIS — Z01818 Encounter for other preprocedural examination: Secondary | ICD-10-CM

## 2023-05-30 DIAGNOSIS — I10 Essential (primary) hypertension: Secondary | ICD-10-CM

## 2023-05-30 DIAGNOSIS — E785 Hyperlipidemia, unspecified: Secondary | ICD-10-CM

## 2023-05-30 LAB — ECHOCARDIOGRAM COMPLETE
Area-P 1/2: 2.6 cm2
S' Lateral: 3.5 cm
Weight: 3180.8 [oz_av]

## 2023-05-30 MED ORDER — PERFLUTREN LIPID MICROSPHERE
1.0000 mL | INTRAVENOUS | Status: AC | PRN
  Administered 2023-05-30: 4 mL via INTRAVENOUS

## 2023-05-30 MED ORDER — METOPROLOL SUCCINATE ER 50 MG PO TB24: 50.0000 mg | ORAL_TABLET | Freq: Every day | ORAL | 3 refills | Status: DC

## 2023-05-30 NOTE — Patient Instructions (Addendum)
 Medication Instructions:  INCREASE Metoprolol succinate (TOPROL-XL) to 50 mg daily *If you need a refill on your cardiac medications before your next appointment, please call your pharmacy*   Follow-Up: At [facility], you and your health needs are our priority.  As part of our continuing mission to provide you with exceptional heart care, we have created designated Provider Care Teams.  These Care Teams include your primary Cardiologist (physician) and Advanced Practice Providers (APPs -  Physician Assistants and Nurse Practitioners) who all work together to provide you with the care you need, when you need it.   Your next appointment:   Keep appointment : Tuesday 06/21/2023 at 10:20 am  Provider:   Little Ishikawa, MD     Other Instructions

## 2023-05-31 ENCOUNTER — Other Ambulatory Visit: Payer: Self-pay | Admitting: Orthopaedic Surgery

## 2023-05-31 ENCOUNTER — Encounter (HOSPITAL_BASED_OUTPATIENT_CLINIC_OR_DEPARTMENT_OTHER): Payer: Self-pay | Admitting: Cardiology

## 2023-05-31 DIAGNOSIS — G8929 Other chronic pain: Secondary | ICD-10-CM

## 2023-05-31 DIAGNOSIS — S46012D Strain of muscle(s) and tendon(s) of the rotator cuff of left shoulder, subsequent encounter: Secondary | ICD-10-CM | POA: Diagnosis not present

## 2023-06-01 ENCOUNTER — Telehealth: Payer: Self-pay | Admitting: *Deleted

## 2023-06-01 DIAGNOSIS — R079 Chest pain, unspecified: Secondary | ICD-10-CM

## 2023-06-01 DIAGNOSIS — Z951 Presence of aortocoronary bypass graft: Secondary | ICD-10-CM

## 2023-06-01 DIAGNOSIS — I25118 Atherosclerotic heart disease of native coronary artery with other forms of angina pectoris: Secondary | ICD-10-CM

## 2023-06-01 MED ORDER — CLOPIDOGREL BISULFATE 75 MG PO TABS: 75.0000 mg | ORAL_TABLET | Freq: Every day | ORAL | 3 refills | Status: DC

## 2023-06-01 NOTE — Telephone Encounter (Signed)
 Spoke with patient, addendum added to recent clinic visit.  Plan for LHC with Dr Lynnette Caffey on 4/1.  Would start on plavix 75 mg daily

## 2023-06-01 NOTE — Telephone Encounter (Signed)
   Pre-operative Risk Assessment    Patient Name: Jose Bowers  DOB: November 26, 1950 MRN: [id number]   Date of last office visit: 05/30/23 DR. [facility] Date of next office visit: 06/21/23 DR. [facility]   Request for Surgical Clearance    Procedure:   LEFT REVERSE TOTAL SHOULDER ARTHROPLASTY  Date of Surgery:  Clearance 07/06/23                                Surgeon:  DR. Ramond Marrow Surgeon's Group or Practice Name:  [facility] Phone number:  [phone] EXT [address] Fax number:  [phone]   Type of Clearance Requested:   - Medical  - Pharmacy:  Hold Aspirin, Clopidogrel (Plavix), and Rivaroxaban (Xarelto)     Type of Anesthesia:  General  WITH INTERSCALENE BLOCK   Additional requests/questions:    [name]   06/01/2023, 5:27 PM

## 2023-06-01 NOTE — Telephone Encounter (Addendum)
 Called spoke to patient.  RN  verbally went over instruction for upcoming left heart cath with PCI.   Patient is aware of time ,date of procedure Medication to be held  Labs to be done  NPO status   RN sent written instruction  to patient via mychart. Patient verbalized understanding and aware he can contact office if has any questions.

## 2023-06-01 NOTE — Telephone Encounter (Signed)
 See telephone note from 06/01/23. RN spoke to patient and instruction given for upcoming procedure 06/14/23. Patient verbalized understanding

## 2023-06-02 NOTE — Telephone Encounter (Signed)
 Preoperative team, patient recently seen by Dr. Bjorn Pippin.  Plans for cardiac catheterization with stent placement.  Patient will need to postpone shoulder surgery for at least 6 months.  Please forward Dr. Campbell Lerner note to requesting office.  Thank you for your help.  I will remove him from the preoperative pool.  Jose Bowers. Quinta Eimer NP-C     06/02/2023, 12:55 PM [facility] [address] Office [phone] Fax [phone]

## 2023-06-02 NOTE — Telephone Encounter (Signed)
 Faxed notes to surgeon office per preop APP.

## 2023-06-02 NOTE — Telephone Encounter (Signed)
 Patient with diagnosis of PE/DVT on Xarelto for anticoagulation.    Procedure:  LEFT REVERSE TOTAL SHOULDER ARTHROPLASTY  Date of procedure: 07/06/23   CrCl 84 ml/min Platelet count 240K   Per office protocol, patient can hold Xarelto for 3 days prior to procedure.     **This guidance is not considered finalized until pre-operative APP has relayed final recommendations.**

## 2023-06-03 MED ORDER — PANTOPRAZOLE SODIUM 40 MG PO TBEC: 40.0000 mg | DELAYED_RELEASE_TABLET | Freq: Every day | ORAL | 3 refills | Status: AC

## 2023-06-03 NOTE — Telephone Encounter (Signed)
 Per Dr Bjorn Pippin, discontinue generic Prilosec and start generic Protonix 40 mg daily   Due to patient starting Clopidogrel 75 mg daily  Patient is aware patient states he has not taken Omeprazole 20 mg since starting Clopidogrel 75 mg  He states he will pick up medication from pharmacy

## 2023-06-03 NOTE — Addendum Note (Signed)
 Addended by: Tobin Chad on: 06/03/2023 03:14 PM   Modules accepted: Orders

## 2023-06-06 DIAGNOSIS — N400 Enlarged prostate without lower urinary tract symptoms: Secondary | ICD-10-CM | POA: Diagnosis not present

## 2023-06-06 DIAGNOSIS — N529 Male erectile dysfunction, unspecified: Secondary | ICD-10-CM | POA: Diagnosis not present

## 2023-06-06 DIAGNOSIS — E7800 Pure hypercholesterolemia, unspecified: Secondary | ICD-10-CM | POA: Diagnosis not present

## 2023-06-06 DIAGNOSIS — Z86711 Personal history of pulmonary embolism: Secondary | ICD-10-CM | POA: Diagnosis not present

## 2023-06-06 DIAGNOSIS — R7303 Prediabetes: Secondary | ICD-10-CM | POA: Diagnosis not present

## 2023-06-06 DIAGNOSIS — Z951 Presence of aortocoronary bypass graft: Secondary | ICD-10-CM | POA: Diagnosis not present

## 2023-06-06 DIAGNOSIS — Z1389 Encounter for screening for other disorder: Secondary | ICD-10-CM | POA: Diagnosis not present

## 2023-06-06 DIAGNOSIS — I10 Essential (primary) hypertension: Secondary | ICD-10-CM | POA: Diagnosis not present

## 2023-06-06 DIAGNOSIS — D682 Hereditary deficiency of other clotting factors: Secondary | ICD-10-CM | POA: Diagnosis not present

## 2023-06-06 DIAGNOSIS — R972 Elevated prostate specific antigen [PSA]: Secondary | ICD-10-CM | POA: Diagnosis not present

## 2023-06-06 DIAGNOSIS — Z0000 Encounter for general adult medical examination without abnormal findings: Secondary | ICD-10-CM | POA: Diagnosis not present

## 2023-06-06 DIAGNOSIS — I2510 Atherosclerotic heart disease of native coronary artery without angina pectoris: Secondary | ICD-10-CM | POA: Diagnosis not present

## 2023-06-06 LAB — LAB REPORT - SCANNED
A1c: 6
EGFR: 79

## 2023-06-08 ENCOUNTER — Other Ambulatory Visit

## 2023-06-08 DIAGNOSIS — R079 Chest pain, unspecified: Secondary | ICD-10-CM | POA: Diagnosis not present

## 2023-06-08 DIAGNOSIS — I25118 Atherosclerotic heart disease of native coronary artery with other forms of angina pectoris: Secondary | ICD-10-CM | POA: Diagnosis not present

## 2023-06-08 DIAGNOSIS — Z951 Presence of aortocoronary bypass graft: Secondary | ICD-10-CM | POA: Diagnosis not present

## 2023-06-09 LAB — CBC
Hematocrit: 46.4 % (ref 37.5–51.0)
Hemoglobin: 14.7 g/dL (ref 13.0–17.7)
MCH: 30.6 pg (ref 26.6–33.0)
MCHC: 31.7 g/dL (ref 31.5–35.7)
MCV: 97 fL (ref 79–97)
Platelets: 227 10*3/uL (ref 150–450)
RBC: 4.81 x10E6/uL (ref 4.14–5.80)
RDW: 12.1 % (ref 11.6–15.4)
WBC: 5.9 10*3/uL (ref 3.4–10.8)

## 2023-06-09 LAB — BASIC METABOLIC PANEL WITH GFR
BUN/Creatinine Ratio: 12 (ref 10–24)
BUN: 13 mg/dL (ref 8–27)
CO2: 25 mmol/L (ref 20–29)
Calcium: 9.5 mg/dL (ref 8.6–10.2)
Chloride: 107 mmol/L — ABNORMAL HIGH (ref 96–106)
Creatinine, Ser: 1.07 mg/dL (ref 0.76–1.27)
Glucose: 108 mg/dL — ABNORMAL HIGH (ref 70–99)
Potassium: 5.1 mmol/L (ref 3.5–5.2)
Sodium: 144 mmol/L (ref 134–144)
eGFR: 75 mL/min/{1.73_m2} (ref >59)

## 2023-06-10 ENCOUNTER — Telehealth: Payer: Self-pay | Admitting: *Deleted

## 2023-06-10 NOTE — Telephone Encounter (Signed)
 Confirmed with the patient his arrival time for his procedure on 06/14/2023 is now 0700. He was grateful for call and agreed with plan.

## 2023-06-10 NOTE — Telephone Encounter (Signed)
 Cardiac Catheterization scheduled at [facility] for: Tuesday June 14, 2023 10:30 AM Arrival time [facility] Main Entrance A at: 8:30 AM  Nothing to eat after midnight prior to procedure, clear liquids until 5 AM day of procedure.  Medication instructions: -Hold:  Xarelto-last dose 06/11/23 then none 06/12/23 until post procedure-per Dr Bjorn Pippin -Other usual morning medications can be taken with sips of water including aspirin 81 mg and Plavix 75 mg.  Plan to go home the same day, you will only stay overnight if medically necessary.  You must have responsible adult to drive you home.  Someone must be with you the first 24 hours after you arrive home.  Reviewed procedure instructions with patient.

## 2023-06-14 ENCOUNTER — Other Ambulatory Visit: Payer: Self-pay

## 2023-06-14 ENCOUNTER — Ambulatory Visit (HOSPITAL_COMMUNITY)
Admission: RE | Admit: 2023-06-14 | Discharge: 2023-06-14 | Disposition: A | Discharge status: Home / Self Care | Attending: Internal Medicine | Admitting: Internal Medicine

## 2023-06-14 ENCOUNTER — Ambulatory Visit (HOSPITAL_COMMUNITY)
Admission: RE | Disposition: A | Discharge status: Home / Self Care | Payer: Self-pay | Source: Home / Self Care | Attending: Internal Medicine

## 2023-06-14 ENCOUNTER — Other Ambulatory Visit (HOSPITAL_COMMUNITY): Payer: Self-pay

## 2023-06-14 DIAGNOSIS — Z955 Presence of coronary angioplasty implant and graft: Secondary | ICD-10-CM

## 2023-06-14 DIAGNOSIS — Z86711 Personal history of pulmonary embolism: Secondary | ICD-10-CM | POA: Diagnosis not present

## 2023-06-14 DIAGNOSIS — M25562 Pain in left knee: Secondary | ICD-10-CM | POA: Diagnosis present

## 2023-06-14 DIAGNOSIS — Z86718 Personal history of other venous thrombosis and embolism: Secondary | ICD-10-CM | POA: Insufficient documentation

## 2023-06-14 DIAGNOSIS — R7303 Prediabetes: Secondary | ICD-10-CM | POA: Insufficient documentation

## 2023-06-14 DIAGNOSIS — E785 Hyperlipidemia, unspecified: Secondary | ICD-10-CM | POA: Diagnosis present

## 2023-06-14 DIAGNOSIS — I10 Essential (primary) hypertension: Secondary | ICD-10-CM | POA: Diagnosis present

## 2023-06-14 DIAGNOSIS — I2510 Atherosclerotic heart disease of native coronary artery without angina pectoris: Secondary | ICD-10-CM | POA: Diagnosis present

## 2023-06-14 DIAGNOSIS — D6851 Activated protein C resistance: Secondary | ICD-10-CM | POA: Diagnosis present

## 2023-06-14 DIAGNOSIS — I25709 Atherosclerosis of coronary artery bypass graft(s), unspecified, with unspecified angina pectoris: Secondary | ICD-10-CM | POA: Insufficient documentation

## 2023-06-14 DIAGNOSIS — Z7901 Long term (current) use of anticoagulants: Secondary | ICD-10-CM | POA: Diagnosis not present

## 2023-06-14 DIAGNOSIS — Z951 Presence of aortocoronary bypass graft: Secondary | ICD-10-CM | POA: Insufficient documentation

## 2023-06-14 DIAGNOSIS — I25119 Atherosclerotic heart disease of native coronary artery with unspecified angina pectoris: Secondary | ICD-10-CM | POA: Diagnosis not present

## 2023-06-14 DIAGNOSIS — I2582 Chronic total occlusion of coronary artery: Secondary | ICD-10-CM | POA: Insufficient documentation

## 2023-06-14 DIAGNOSIS — IMO0002 Reserved for concepts with insufficient information to code with codable children: Secondary | ICD-10-CM | POA: Insufficient documentation

## 2023-06-14 DIAGNOSIS — Z7902 Long term (current) use of antithrombotics/antiplatelets: Secondary | ICD-10-CM | POA: Diagnosis not present

## 2023-06-14 DIAGNOSIS — Z79899 Other long term (current) drug therapy: Secondary | ICD-10-CM | POA: Diagnosis not present

## 2023-06-14 DIAGNOSIS — Y633 Inadvertent exposure of patient to radiation during medical care: Secondary | ICD-10-CM | POA: Insufficient documentation

## 2023-06-14 DIAGNOSIS — R9439 Abnormal result of other cardiovascular function study: Secondary | ICD-10-CM | POA: Diagnosis present

## 2023-06-14 HISTORY — PX: CORONARY ATHERECTOMY: CATH118238

## 2023-06-14 HISTORY — PX: LEFT HEART CATH AND CORONARY ANGIOGRAPHY: CATH118249

## 2023-06-14 HISTORY — PX: CORONARY STENT INTERVENTION: CATH118234

## 2023-06-14 LAB — POCT ACTIVATED CLOTTING TIME
Activated Clotting Time: 239 s
Activated Clotting Time: 510 s
Activated Clotting Time: 412 s
Activated Clotting Time: 763 s
Activated Clotting Time: 838 s

## 2023-06-14 SURGERY — CORONARY STENT INTERVENTION
Anesthesia: LOCAL

## 2023-06-14 MED ORDER — CLOPIDOGREL BISULFATE 75 MG PO TABS: 75.0000 mg | ORAL_TABLET | Freq: Every day | ORAL | Status: DC

## 2023-06-14 MED ORDER — ASPIRIN 81 MG PO CHEW: 81.0000 mg | CHEWABLE_TABLET | ORAL | Status: DC

## 2023-06-14 MED ORDER — VERAPAMIL HCL 2.5 MG/ML IV SOLN
INTRAVENOUS | Status: AC
  Filled 2023-06-14: qty 2

## 2023-06-14 MED ORDER — HEPARIN SODIUM (PORCINE) 1000 UNIT/ML IJ SOLN
INTRAMUSCULAR | Status: DC | PRN
  Administered 2023-06-14: 3000 [IU] via INTRA_ARTERIAL
  Administered 2023-06-14 (×2): 5000 [IU] via INTRA_ARTERIAL

## 2023-06-14 MED ORDER — VIPERSLIDE LUBRICANT OPTIME: TOPICAL | Status: DC | PRN

## 2023-06-14 MED ORDER — MIDAZOLAM HCL 2 MG/2ML IJ SOLN
INTRAMUSCULAR | Status: DC | PRN
  Administered 2023-06-14: 1 mg via INTRAVENOUS
  Administered 2023-06-14: 2 mg via INTRAVENOUS

## 2023-06-14 MED ORDER — HEPARIN (PORCINE) IN NACL 1000-0.9 UT/500ML-% IV SOLN
INTRAVENOUS | Status: DC | PRN
  Administered 2023-06-14: 1000 mL
  Administered 2023-06-14: 500 mL

## 2023-06-14 MED ORDER — ASPIRIN 81 MG PO CHEW: 81.0000 mg | CHEWABLE_TABLET | Freq: Every day | ORAL | Status: DC

## 2023-06-14 MED ORDER — SODIUM CHLORIDE 0.9 % WEIGHT BASED INFUSION: 3.0000 mL/kg/h | INTRAVENOUS | Status: DC

## 2023-06-14 MED ORDER — ASPIRIN 81 MG PO TBEC: 81.0000 mg | DELAYED_RELEASE_TABLET | Freq: Every day | ORAL | 2 refills | Status: DC

## 2023-06-14 MED ORDER — CLOPIDOGREL BISULFATE 75 MG PO TABS: 75.0000 mg | ORAL_TABLET | ORAL | Status: DC

## 2023-06-14 MED ORDER — HYDRALAZINE HCL 20 MG/ML IJ SOLN: 10.0000 mg | INTRAMUSCULAR | Status: DC | PRN

## 2023-06-14 MED ORDER — VERAPAMIL HCL 2.5 MG/ML IV SOLN
INTRAVENOUS | Status: DC | PRN
  Administered 2023-06-14: 8 mL via INTRA_ARTERIAL

## 2023-06-14 MED ORDER — LIDOCAINE HCL (PF) 1 % IJ SOLN
INTRAMUSCULAR | Status: DC | PRN
  Administered 2023-06-14: 2 mL

## 2023-06-14 MED ORDER — HEPARIN SODIUM (PORCINE) 1000 UNIT/ML IJ SOLN
INTRAMUSCULAR | Status: AC
  Filled 2023-06-14: qty 10

## 2023-06-14 MED ORDER — SODIUM CHLORIDE 0.9% FLUSH: 3.0000 mL | INTRAVENOUS | Status: DC | PRN

## 2023-06-14 MED ORDER — SODIUM CHLORIDE 0.9 % WEIGHT BASED INFUSION: 1.0000 mL/kg/h | INTRAVENOUS | Status: DC

## 2023-06-14 MED ORDER — ONDANSETRON HCL 4 MG/2ML IJ SOLN
4.0000 mg | Freq: Four times a day (QID) | INTRAMUSCULAR | Status: DC | PRN
Start: 2023-06-14 — End: 2023-06-14

## 2023-06-14 MED ORDER — LABETALOL HCL 5 MG/ML IV SOLN: 10.0000 mg | INTRAVENOUS | Status: DC | PRN

## 2023-06-14 MED ORDER — MIDAZOLAM HCL 2 MG/2ML IJ SOLN
INTRAMUSCULAR | Status: AC
Start: 2023-06-14 — End: ?
  Filled 2023-06-14: qty 2

## 2023-06-14 MED ORDER — MIDAZOLAM HCL 2 MG/2ML IJ SOLN
INTRAMUSCULAR | Status: AC
  Filled 2023-06-14: qty 2

## 2023-06-14 MED ORDER — ASPIRIN 81 MG PO TBEC
81.0000 mg | DELAYED_RELEASE_TABLET | Freq: Every day | ORAL | 0 refills | Status: AC
  Filled 2023-06-14: qty 30, 30d supply, fill #0

## 2023-06-14 MED ORDER — ACETAMINOPHEN 325 MG PO TABS: 650.0000 mg | ORAL_TABLET | ORAL | Status: DC | PRN

## 2023-06-14 MED ORDER — NITROGLYCERIN 1 MG/10 ML FOR IR/CATH LAB
INTRA_ARTERIAL | Status: AC
  Filled 2023-06-14: qty 10

## 2023-06-14 MED ORDER — FENTANYL CITRATE (PF) 100 MCG/2ML IJ SOLN
INTRAMUSCULAR | Status: AC
  Filled 2023-06-14: qty 2

## 2023-06-14 MED ORDER — IOHEXOL 350 MG/ML SOLN
INTRAVENOUS | Status: DC | PRN
  Administered 2023-06-14: 90 mL

## 2023-06-14 MED ORDER — LIDOCAINE HCL (PF) 1 % IJ SOLN
INTRAMUSCULAR | Status: AC
  Filled 2023-06-14: qty 30

## 2023-06-14 MED ORDER — SODIUM CHLORIDE 0.9 % IV SOLN: 250.0000 mL | INTRAVENOUS | Status: DC | PRN

## 2023-06-14 MED ORDER — FENTANYL CITRATE (PF) 100 MCG/2ML IJ SOLN
INTRAMUSCULAR | Status: DC | PRN
  Administered 2023-06-14: 50 ug via INTRAVENOUS
  Administered 2023-06-14: 25 ug via INTRAVENOUS

## 2023-06-14 MED ORDER — SODIUM CHLORIDE 0.9% FLUSH: 3.0000 mL | Freq: Two times a day (BID) | INTRAVENOUS | Status: DC

## 2023-06-14 SURGICAL SUPPLY — 28 items
BALL SAPPHIRE NC24 3.0X10 (BALLOONS) ×2 IMPLANT
BALLN EMERGE MR 2.0X12 (BALLOONS) ×4 IMPLANT
BALLN WOLVERINE 3.00X10 (BALLOONS) ×2 IMPLANT
BALLN ~~LOC~~ EMERGE MR 3.25X8 (BALLOONS) ×2 IMPLANT
BALLN ~~LOC~~ EUPHORA RX 3.25X12 (BALLOONS) ×2 IMPLANT
BALLOON EMERGE MR 2.0X12 (BALLOONS) IMPLANT
BALLOON SAPPHIRE NC24 3.0X10 (BALLOONS) IMPLANT
BALLOON TAKERU 1.5X6 (BALLOONS) IMPLANT
BALLOON WOLVERINE 3.00X10 (BALLOONS) IMPLANT
BALLOON ~~LOC~~ EMERGE MR 3.25X8 (BALLOONS) IMPLANT
BALLOON ~~LOC~~ EUPHORA RX 3.25X12 (BALLOONS) IMPLANT
CATH GUIDELINER COAST (CATHETERS) IMPLANT
CATH INFINITI 5FR ANG PIGTAIL (CATHETERS) IMPLANT
CATH LAUNCHER 6FR EBU3.5 (CATHETERS) IMPLANT
CATH TELEPORT (CATHETERS) IMPLANT
CROWN DIAMONDBACK CLASSIC 1.25 (BURR) IMPLANT
DEVICE RAD COMP TR BAND LRG (VASCULAR PRODUCTS) IMPLANT
GLIDESHEATH SLEND SS 6F .021 (SHEATH) IMPLANT
KIT ENCORE 26 ADVANTAGE (KITS) IMPLANT
KIT HEMO VALVE WATCHDOG (MISCELLANEOUS) IMPLANT
PACK CARDIAC CATHETERIZATION (CUSTOM PROCEDURE TRAY) ×2 IMPLANT
SET ATX-X65L (MISCELLANEOUS) IMPLANT
STENT SYNERGY XD 3.0X16 (Permanent Stent) IMPLANT
STENT SYS SYNERGY XD 3.0X16 (Permanent Stent) ×2 IMPLANT
WIRE ASAHI PROWATER 300CM (WIRE) IMPLANT
WIRE CHOICE GRAPHX PT 300 (WIRE) IMPLANT
WIRE EMERALD 3MM-J .035X260CM (WIRE) IMPLANT
WIRE VIPERWIRE COR FLEX .012 (WIRE) IMPLANT

## 2023-06-14 NOTE — Progress Notes (Signed)
 After eating client c/o substernal discomfort 1/10 and Angie PA notified and

## 2023-06-14 NOTE — Discharge Summary (Addendum)
 Discharge Summary for Same Day PCI   Patient ID: Jose Bowers MRN: [id number]; DOB: 1951-01-25  Admit date: 06/14/2023 Discharge date: 06/14/2023  Primary Care Provider: Georgann Housekeeper, MD  Primary Cardiologist: Little Ishikawa, MD  Primary Electrophysiologist:  None   Discharge Diagnoses    Principal Problem:   Coronary artery disease involving native coronary artery of native heart with angina pectoris [name]   Essential hypertension   Factor V Leiden (HCC)   Hyperlipidemia with target LDL less than 70   Abnormal nuclear stress test   Coronary artery disease   Acute pain of left knee   Exposure to medical diagnostic radiation   Diagnostic Studies/Procedures    Cardiac Catheterization 06/14/2023:    Prox LAD to Mid LAD lesion is 100% stenosed.   Prox Cx lesion is 95% stenosed.   Dist RCA lesion is 60% stenosed with 60% stenosed side branch in RPDA.   Origin lesion is 100% stenosed.   Origin lesion before RPDA  is 100% stenosed.   A stent was successfully placed.   Post intervention, there is a 0% residual stenosis.   1.  High-grade proximal left circumflex lesion treated with complex PCI requiring GuideLiner, orbital atherectomy, and Cutting Balloon angioplasty with 1 drug-eluting stent placed. 2.  LVEDP of 22 mmHg.   Recommendation: Triple therapy consisting of Xarelto, Plavix, and aspirin for 1 month followed by Xarelto and Plavix for 6 months followed by indefinite Xarelto monotherapy.  Same-day discharge after 6 hours of observation. _____________   History of Present Illness     Jose Bowers is a 73 y.o. male with a hx of CAD status post CABG (LIMA-LAD, SVG-OM, SVG sequential to PDA and PLB) factor V Leiden, hypertension, hyperlipidemia, DVT following shoulder surgery in 2014, saddle PE treated with EKOS in 2019 who presents for follow-up.  He was referred by Dr. Donette Larry for evaluation of chest pain, initially seen on 09/19/2019.  Exercise  Myoview was attempted on 10/04/2019.  He did not reach target heart rate on treadmill and was converted to pharmacologic study.  Despite not reaching target heart rate, he had 1.5 mm horizontal ST depressions in inferior and anterolateral leads and symptoms of chest pain.  MPI imaging showed reversible defect in apical anterior wall and apex consistent with LAD ischemia.  LVEF 49%.  Cardiac catheterization on 10/16/2019 showed severe heavily calcified multivessel disease (99% proximal LAD, 90% proximal circumflex, 80% distal RCA, 90% RPDA).  Underwent CABG 10/18/19 ((LIMA-LAD, SVG-OM, SVG sequential to PDA and PLB).  Echocardiogram 10/16/2019 showed normal biventricular function, no significant valvular disease.  Reported chest pain and underwent stress PET 05/05/2023, which showed large reversible severe lateral wall perfusion defect; high risk study.  LHC on 05/13/2023 showed patent LIMA-LAD, occluded SVG-OM and occluded sequential SVG to PDA to PLV, occluded proximal LAD, high-grade proximal LCx disease, moderate distal RCA disease.  When last seen, he was having chest discomfort with exertion and stress, but had not taken NTG. Decisions regarding proceeding with PCI to LX complicated by need for shoulder surgery. Ortho OK with waiting for 6 months if PCI completed.  Cardiac catheterization was arranged for further evaluation.  Hospital Course     The patient underwent cardiac cath as noted above with high grade LCX lesion of 95 % stenosis treated successfully with cutting balloon angioplasty and DES 3.0 x 16 mm. Plan for DAPT with ASA/plavix with xarelto for one month, then discontinue ASA and continue plavix and xarelto x 6 months. The  patient was seen by cardiac rehab while in short stay. There were no observed complications post cath. Radial cath site was re-evaluated prior to discharge and found to be stable without any complications. Instructions/precautions regarding cath site care were given prior to  discharge.  Jose Bowers was seen by Dr. Lynnette Caffey and determined stable for discharge home. Follow up with our office has been arranged. Medications are listed below. Pertinent changes include ASA x 30 days.      Due to the nature of the diagnostic procedure, the patient received prolonged fluoroscopy time and should therefore be examined in follow-up for adverse radiation skin effects. The patient was educated to monitor for skin changes and understands to notify our office of such.    _____________  Cath/PCI Registry Performance & Quality Measures: Aspirin prescribed? - Yes ADP Receptor Inhibitor (Plavix/Clopidogrel, Brilinta/Ticagrelor or Effient/Prasugrel) prescribed (includes medically managed patients)? - Yes High Intensity Statin (Lipitor 40-80mg  or Crestor 20-40mg ) prescribed? - Yes For EF <40%, was ACEI/ARB prescribed? - Not Applicable (EF >/= 40%) For EF <40%, Aldosterone Antagonist (Spironolactone or Eplerenone) prescribed? - Not Applicable (EF >/= 40%) Cardiac Rehab Phase II ordered (Included Medically managed Patients)? - Yes  _____________   Discharge Vitals Blood pressure (!) 130/97, pulse 86, temperature 98.2 F (36.8 C), temperature source Oral, resp. rate 19, height 5\' 10"  (1.778 m), weight 88 kg, SpO2 96%.  Filed Weights   06/14/23 0716  Weight: 88 kg    Last Labs & Radiologic Studies    CBC No results for input(s): "WBC", "NEUTROABS", "HGB", "HCT", "MCV", "PLT" in the last 72 hours. Basic Metabolic Panel No results for input(s): "NA", "K", "CL", "CO2", "GLUCOSE", "BUN", "CREATININE", "CALCIUM", "MG", "PHOS" in the last 72 hours. Liver Function Tests No results for input(s): "AST", "ALT", "ALKPHOS", "BILITOT", "PROT", "ALBUMIN" in the last 72 hours. No results for input(s): "LIPASE", "AMYLASE" in the last 72 hours. High Sensitivity Troponin:   No results for input(s): "TROPONINIHS" in the last 720 hours.  BNP Invalid input(s): "POCBNP" D-Dimer No  results for input(s): "DDIMER" in the last 72 hours. Hemoglobin A1C No results for input(s): "HGBA1C" in the last 72 hours. Fasting Lipid Panel No results for input(s): "CHOL", "HDL", "LDLCALC", "TRIG", "CHOLHDL", "LDLDIRECT" in the last 72 hours. Thyroid Function Tests No results for input(s): "TSH", "T4TOTAL", "T3FREE", "THYROIDAB" in the last 72 hours.  Invalid input(s): "FREET3" _____________  CARDIAC CATHETERIZATION Result Date: 06/14/2023   Prox LAD to Mid LAD lesion is 100% stenosed.   Prox Cx lesion is 95% stenosed.   Dist RCA lesion is 60% stenosed with 60% stenosed side branch in RPDA.   Origin lesion is 100% stenosed.   Origin lesion before RPDA  is 100% stenosed.   A stent was successfully placed.   Post intervention, there is a 0% residual stenosis. 1.  High-grade proximal left circumflex lesion treated with complex PCI requiring GuideLiner, orbital atherectomy, and Cutting Balloon angioplasty with 1 drug-eluting stent placed. 2.  LVEDP of 22 mmHg. Recommendation: Triple therapy consisting of Xarelto, Plavix, and aspirin for 1 month followed by Xarelto and Plavix for 6 months followed by indefinite Xarelto monotherapy.  Same-day discharge after 6 hours of observation.   ECHOCARDIOGRAM COMPLETE Result Date: 05/30/2023    ECHOCARDIOGRAM REPORT   Patient Name:   SANJITH SIWEK Date of Exam: 05/30/2023 Medical Rec #:  [id number]        Height:       70.0 in Accession #:    [id number]  Weight:       198.8 lb Date of Birth:  [birth year]        BSA:          2.082 m Patient Age:    72 years         BP:           130/66 mmHg Patient Gender: M                HR:           73 bpm. Exam Location:  [address] Procedure: 2D Echo, Cardiac Doppler, Color Doppler and Intracardiac            Opacification Agent (Both Spectral and Color Flow Doppler were            utilized during procedure). Indications:    Coronary artery disease I25.10  History:        Patient has prior history of Echocardiogram  examinations, most                 recent 10/16/2019. Prior CABG; Risk Factors:Hypertension.  Sonographer:    Thurman Coyer RDCS Referring Phys: [id number] Tanna Savoy [facility]  Sonographer Comments: Restricted Mobilty due to torn rotator cuff. IMPRESSIONS  1. Left ventricular ejection fraction, by estimation, is 55 to 60%. The left ventricle has normal function. The left ventricle has no regional wall motion abnormalities. Left ventricular diastolic parameters were normal.  2. Right ventricular systolic function is normal. The right ventricular size is normal.  3. The mitral valve is normal in structure. Trivial mitral valve regurgitation. No evidence of mitral stenosis.  4. The aortic valve is normal in structure. Aortic valve regurgitation is not visualized. No aortic stenosis is present.  5. The inferior vena cava is normal in size with greater than 50% respiratory variability, suggesting right atrial pressure of 3 mmHg. FINDINGS  Left Ventricle: Left ventricular ejection fraction, by estimation, is 55 to 60%. The left ventricle has normal function. The left ventricle has no regional wall motion abnormalities. Definity contrast agent was given IV to delineate the left ventricular  endocardial borders. The left ventricular internal cavity size was normal in size. There is no left ventricular hypertrophy. Left ventricular diastolic parameters were normal. Right Ventricle: The right ventricular size is normal. No increase in right ventricular wall thickness. Right ventricular systolic function is normal. Left Atrium: Left atrial size was normal in size. Right Atrium: Right atrial size was normal in size. Pericardium: There is no evidence of pericardial effusion. Mitral Valve: The mitral valve is normal in structure. Trivial mitral valve regurgitation. No evidence of mitral valve stenosis. Tricuspid Valve: The tricuspid valve is normal in structure. Tricuspid valve regurgitation is not demonstrated. No evidence of  tricuspid stenosis. Aortic Valve: The aortic valve is normal in structure. Aortic valve regurgitation is not visualized. No aortic stenosis is present. Pulmonic Valve: The pulmonic valve was normal in structure. Pulmonic valve regurgitation is trivial. No evidence of pulmonic stenosis. Aorta: The aortic root is normal in size and structure. Venous: The inferior vena cava was not well visualized. The inferior vena cava is normal in size with greater than 50% respiratory variability, suggesting right atrial pressure of 3 mmHg. IAS/Shunts: No atrial level shunt detected by color flow Doppler.  LEFT VENTRICLE PLAX 2D LVIDd:         4.70 cm   Diastology LVIDs:         3.50 cm   LV e' medial:  8.32 cm/s LV PW:         1.10 cm   LV E/e' medial:  7.4 LV IVS:        1.10 cm   LV e' lateral:   13.10 cm/s LVOT diam:     2.60 cm   LV E/e' lateral: 4.7 LV SV:         96 LV SV Index:   46 LVOT Area:     5.31 cm  RIGHT VENTRICLE RV Basal diam:  3.50 cm RV Mid diam:    3.30 cm RV S prime:     10.70 cm/s TAPSE (M-mode): 1.8 cm LEFT ATRIUM             Index        RIGHT ATRIUM           Index LA diam:        4.40 cm 2.11 cm/m   RA Area:     15.90 cm LA Vol (A2C):   64.4 ml 30.93 ml/m  RA Volume:   40.30 ml  19.36 ml/m LA Vol (A4C):   51.6 ml 24.78 ml/m LA Biplane Vol: 60.8 ml 29.20 ml/m  AORTIC VALVE LVOT Vmax:   82.90 cm/s LVOT Vmean:  53.800 cm/s LVOT VTI:    0.181 m  AORTA Ao Root diam: 3.50 cm Ao Asc diam:  3.90 cm MITRAL VALVE               TRICUSPID VALVE MV Area (PHT): 2.60 cm    TR Peak grad:   16.6 mmHg MV Decel Time: 292 msec    TR Vmax:        204.00 cm/s MV E velocity: 61.80 cm/s MV A velocity: 95.90 cm/s  SHUNTS MV E/A ratio:  0.64        Systemic VTI:  0.18 m                            Systemic Diam: 2.60 cm Aditya Sabharwal Electronically signed by Dorthula Nettles Signature Date/Time: 05/30/2023/4:32:29 PM    Final     Disposition   Pt is being discharged home today in good condition.  Follow-up Plans  & Appointments     Discharge Instructions     AMB Referral to Cardiac Rehabilitation - Phase II   Complete by: As directed    Diagnosis: Coronary Stents   After initial evaluation and assessments completed: Virtual Based Care may be provided alone or in conjunction with Phase 2 Cardiac Rehab based on patient barriers.: Yes   Intensive Cardiac Rehabilitation (ICR) [facility] location only OR Traditional Cardiac Rehabilitation (TCR) *If criteria for ICR are not met will enroll in TCR [facility] only): Yes        Discharge Medications   Allergies as of 06/14/2023   No Known Allergies      Medication List     TAKE these medications    aspirin EC 81 MG tablet Take 1 tablet (81 mg total) by mouth daily. Swallow whole.   atorvastatin 80 MG tablet Commonly known as: LIPITOR TAKE 1 TABLET BY MOUTH DAILY AT 6 PM.   clopidogrel 75 MG tablet Commonly known as: PLAVIX Take 1 tablet (75 mg total) by mouth daily.   losartan 50 MG tablet Commonly known as: COZAAR Take 50 mg by mouth daily.   metoprolol succinate 50 MG 24 hr tablet Commonly known as: TOPROL-XL Take 1 tablet (50 mg total) by mouth  daily. Take with or immediately following a meal.   nitroGLYCERIN 0.4 MG SL tablet Commonly known as: Nitrostat Place 1 tablet (0.4 mg total) under the tongue every 5 (five) minutes as needed for chest pain.   pantoprazole 40 MG tablet Commonly known as: PROTONIX Take 1 tablet (40 mg total) by mouth daily.   Xarelto 20 MG Tabs tablet Generic drug: rivaroxaban Take 20 mg by mouth daily.           Allergies No Known Allergies  Outstanding Labs/Studies   20  Duration of Discharge Encounter   Greater than 30 minutes including physician time.  Signed, Roe Rutherford Duke, PA 06/14/2023, 2:17 PM   ATTENDING ATTESTATION:  After conducting a review of all available clinical information with the care team, interviewing the patient, and performing a physical exam, I agree with the  findings and plan described in this note.   The patient is doing well after elective complex PCI due to exertional angina and high risk stress test.  He has a mild hematoma.  He is chest pain free.  He has had no issues postprocedure.  I reviewed the need for dual antiplatelet therapy and in his case triple therapy for a month followed by Plavix and Xarelto for 5 additional months and then Xarelto monotherapy indefinitely.  I did review the need for strict blood pressure, blood sugar, and lipid control.  We did go over right radial access care as well.  I stressed the importance of his medications and continued follow-up.  The patient and his wife have no further questions.  Stable for discharge later today with close hospital follow-up.   APP discharge time:20 MD discharge time:25  Alverda Skeans, MD Pager [phone]

## 2023-06-14 NOTE — Progress Notes (Signed)
 CARDIAC REHAB PHASE I     Post stent education including site care, restrictions, risk factors, exercise guidelines, NTG use, antiplatelet therapy importance, heart healthy diet and CRP2 reviewed. All questions and concerns addressed. Will refer to [facility] or CRP2. Plan for home later today.    [id number] Woodroe Chen, RN BSN 06/14/2023 3:07 PM

## 2023-06-14 NOTE — Progress Notes (Signed)
 TR band removed at 1600, gauze dressing applied. Right radial level 0, clean, dry, and intact.

## 2023-06-14 NOTE — Discharge Instructions (Addendum)
 Resume Xarelto tomorrow. Resume plavix tomorrow. Start aspirin 81mg  daily.  Take all three of these medications for 1 month then stop aspirin.

## 2023-06-14 NOTE — Interval H&P Note (Signed)
 History and Physical Interval Note:  06/14/2023 9:05 AM  Jose Bowers  has presented today for surgery, with the diagnosis of stenosis - chest pain.  The various methods of treatment have been discussed with the patient and family. After consideration of risks, benefits and other options for treatment, the patient has consented to  Procedure(s): CORONARY STENT INTERVENTION (N/A) as a surgical intervention.  The patient's history has been reviewed, patient examined, no change in status, stable for surgery.  I have reviewed the patient's chart and labs.  Questions were answered to the patient's satisfaction.     [name]

## 2023-06-14 NOTE — Progress Notes (Signed)
 On arrival from cath lab, hematoma noted right radial site and larger than area marked; Adam, RN moved radial band and no further hematoma noted

## 2023-06-15 ENCOUNTER — Encounter (HOSPITAL_COMMUNITY): Payer: Self-pay | Admitting: Internal Medicine

## 2023-06-15 MED FILL — Nitroglycerin IV Soln 100 MCG/ML in D5W: INTRA_ARTERIAL | Qty: 10 | Status: AC

## 2023-06-16 ENCOUNTER — Telehealth: Payer: Self-pay | Admitting: Cardiology

## 2023-06-16 NOTE — Telephone Encounter (Signed)
 Spoke with the patient and gave him the number to [facility] office and informed him that I will send message as well.   Pt needs a return to work note/clearance from Cardiology. Said Dr Lynnette Caffey performed procedure. Dr Bjorn Pippin is not in office/vacation so unable to send note. He wants to return to work tomorrow. Thank you

## 2023-06-16 NOTE — Telephone Encounter (Signed)
 Pt called in stating his heart stent went well and he is feeling great. He states Dr. Lynnette Caffey told him he can return to work immediately, however his job is requiring Dr. Bjorn Pippin write something too. He asked If he can send a return to work note via Clinical cytogeneticist. Please advise. Pt would like to return tomorrow.

## 2023-06-17 ENCOUNTER — Encounter (HOSPITAL_BASED_OUTPATIENT_CLINIC_OR_DEPARTMENT_OTHER): Payer: Self-pay | Admitting: Cardiology

## 2023-06-17 ENCOUNTER — Telehealth: Payer: Self-pay | Admitting: Internal Medicine

## 2023-06-17 NOTE — Telephone Encounter (Signed)
 Patient stated today he will need a letter from Dr. Lynnette Caffey addressed to his employer releasing him back to work.  Patient stated can email the letter to him at [email or web address] or put letter in his My Chart.  Patient stated he is very pleased about the procedure which worked excellently as all of his previous symptoms have disappeared.

## 2023-06-17 NOTE — Telephone Encounter (Signed)
 Spoke with patient, he reports feeling "100%  better" and would like to let Dr. Lynnette Caffey know the stent worked for him and he is very grateful.   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  Discussed with Dr. Lynnette Caffey, OK to return to work without restrictions today.  Letter created and sent to patient via MyChart, also send via MyChart message for ease of access to patient.  Patient verbalized understanding and expressed appreciation for assistance.

## 2023-06-17 NOTE — Telephone Encounter (Signed)
 Work note provided per patient and other telephone encounter details from today.

## 2023-06-17 NOTE — Telephone Encounter (Signed)
 He is fine to return to work, can we give him a note ASAP?

## 2023-06-19 NOTE — Progress Notes (Unsigned)
 Cardiology Office Note:    Date:  06/21/2023   ID:  Jose Bowers, DOB 04-Feb-1951,  [id number]  PCP:  Georgann Housekeeper, MD  Cardiologist:  Little Ishikawa, MD  Electrophysiologist:  None   Referring MD: Georgann Housekeeper, MD   Chief Complaint  Patient presents with   Coronary Artery Disease    History of Present Illness:    Jose Bowers is a 73 y.o. male with a hx of CAD status post CABG (LIMA-LAD, SVG-OM, SVG sequential to PDA and PLB) factor V Leiden, hypertension, hyperlipidemia, DVT following shoulder surgery in 2014, saddle PE treated with EKOS in 2019 who presents for follow-up.  He was referred by Dr. Donette Larry for evaluation of chest pain, initially seen on 09/19/2019.  Exercise Myoview was attempted on 10/04/2019.  He did not reach target heart rate on treadmill and was converted to pharmacologic study.  Despite not reaching target heart rate, he had 1.5 mm horizontal ST depressions in inferior and anterolateral leads and symptoms of chest pain.  MPI imaging showed reversible defect in apical anterior wall and apex consistent with LAD ischemia.  LVEF 49%.  Cardiac catheterization on 10/16/2019 showed severe heavily calcified multivessel disease (99% proximal LAD, 90% proximal circumflex, 80% distal RCA, 90% RPDA).  Underwent CABG 10/18/19 ((LIMA-LAD, SVG-OM, SVG sequential to PDA and PLB).  Echocardiogram 10/16/2019 showed normal biventricular function, no significant valvular disease.  Reported chest pain and underwent stress PET 05/05/2023, which showed large reversible severe lateral wall perfusion defect; high risk study.  LHC on 05/13/2023 showed patent LIMA-LAD, occluded SVG-OM and occluded sequential SVG to PDA to PLV, occluded proximal LAD, high-grade proximal LCx disease, moderate distal RCA disease.  Echocardiogram 05/30/2023 showed EF 55 to 60%, normal RV function, no significant valvular disease.  Underwent PCI to LCx on 06/14/2023 with orbital atherectomy, Cutting Balloon angioplasty  with drug-eluting stent placement.  Since last clinic visit, he reports he is doing well. Not  having any chest pain, reports completely resolved after his procedure.  Does report having pain and swelling in right wrist. No weakness or loss of sensation.   Wt Readings from Last 3 Encounters:  06/21/23 193 lb (87.5 kg)  06/14/23 194 lb (88 kg)  05/30/23 198 lb 12.8 oz (90.2 kg)    BP Readings from Last 3 Encounters:  06/21/23 118/70  06/14/23 125/81  05/30/23 130/66     Past Medical History:  Diagnosis Date   Factor V Leiden (HCC)    Hypertension     Past Surgical History:  Procedure Laterality Date   CORONARY ARTERY BYPASS GRAFT N/A 10/18/2019   Procedure: CORONARY ARTERY BYPASS GRAFTING (CABG), ON PUMP, TIMES FOUR, USING LEFT INTERAL MAMMARY ATERY AND ENDOSCOPICALLY HARVESTED RIGHT GREATER SAPHENOUS VEIN;  Surgeon: Alleen Borne, MD;  Location: [facility] OR;  Service: Open Heart Surgery;  Laterality: N/A;   CORONARY ATHERECTOMY  06/14/2023   Procedure: CORONARY ATHERECTOMY;  Surgeon: [name], MD;  Location: [facility] INVASIVE CV LAB;  Service: Cardiovascular;;   CORONARY STENT INTERVENTION N/A 06/14/2023   Procedure: CORONARY STENT INTERVENTION;  Surgeon: [name], MD;  Location: [facility] INVASIVE CV LAB;  Service: Cardiovascular;  Laterality: N/A;   HERNIA REPAIR     IR ANGIOGRAM PULMONARY BILATERAL SELECTIVE  03/19/2017   IR ANGIOGRAM SELECTIVE EACH ADDITIONAL VESSEL  03/19/2017   IR ANGIOGRAM SELECTIVE EACH ADDITIONAL VESSEL  03/19/2017   IR INFUSION THROMBOL ARTERIAL INITIAL (MS)  03/19/2017   IR INFUSION THROMBOL ARTERIAL INITIAL (MS)  03/19/2017  IR THROMB F/U EVAL ART/VEN FINAL DAY (MS)  03/20/2017   IR US GUIDE VASC ACCESS RIGHT  03/19/2017   JOINT REPLACEMENT     LEFT HEART CATH AND CORONARY ANGIOGRAPHY N/A 10/16/2019   Procedure: LEFT HEART CATH AND CORONARY ANGIOGRAPHY;  Surgeon: Marykay Lex, MD;  Location: [facility] INVASIVE CV LAB;  Service: Cardiovascular;  Laterality: N/A;   LEFT  HEART CATH AND CORONARY ANGIOGRAPHY N/A 06/14/2023   Procedure: LEFT HEART CATH AND CORONARY ANGIOGRAPHY;  Surgeon: [name], MD;  Location: [facility] INVASIVE CV LAB;  Service: Cardiovascular;  Laterality: N/A;   LEFT HEART CATH AND CORS/GRAFTS ANGIOGRAPHY N/A 05/13/2023   Procedure: LEFT HEART CATH AND CORS/GRAFTS ANGIOGRAPHY;  Surgeon: [name], MD;  Location: [facility] INVASIVE CV LAB;  Service: Cardiovascular;  Laterality: N/A;   ROTATOR CUFF REPAIR     TEE WITHOUT CARDIOVERSION N/A 10/18/2019   Procedure: TRANSESOPHAGEAL ECHOCARDIOGRAM (TEE);  Surgeon: Alleen Borne, MD;  Location: [facility] OR;  Service: Open Heart Surgery;  Laterality: N/A;    Current Medications: Current Meds  Medication Sig   aspirin EC 81 MG tablet Take 1 tablet (81 mg total) by mouth daily. Swallow whole.   atorvastatin (LIPITOR) 80 MG tablet TAKE 1 TABLET BY MOUTH DAILY AT 6 PM.   clopidogrel (PLAVIX) 75 MG tablet Take 1 tablet (75 mg total) by mouth daily.   losartan (COZAAR) 50 MG tablet Take 50 mg by mouth daily.   metoprolol succinate (TOPROL-XL) 50 MG 24 hr tablet Take 1 tablet (50 mg total) by mouth daily. Take with or immediately following a meal.   nitroGLYCERIN (NITROSTAT) 0.4 MG SL tablet Place 1 tablet (0.4 mg total) under the tongue every 5 (five) minutes as needed for chest pain.   pantoprazole (PROTONIX) 40 MG tablet Take 1 tablet (40 mg total) by mouth daily.   XARELTO 20 MG TABS tablet Take 20 mg by mouth daily.     Allergies:   Patient has no known allergies.   Social History   Socioeconomic History   Marital status: Married    Spouse name: Not on file   Number of children: Not on file   Years of education: Not on file   Highest education level: Not on file  Occupational History   Occupation: retired     Comment: Dentist   Tobacco Use   Smoking status: Never   Smokeless tobacco: Never  Vaping Use   Vaping status: Never Used  Substance and Sexual Activity   Alcohol use: Yes     Comment: "few times a week"   Drug use: No   Sexual activity: Not on file  Other Topics Concern   Not on file  Social History Narrative   Not on file   Social Drivers of Health   Financial Resource Strain: Not on file  Food Insecurity: Not on file  Transportation Needs: Not on file  Physical Activity: Not on file  Stress: Not on file  Social Connections: Not on file     Family History: The patient's family history includes Clotting disorder in his brother, father, grandchild, and son.  ROS:   Please see the history of present illness.     All other systems reviewed and are negative.  EKGs/Labs/Other Studies Reviewed:    The following studies were reviewed today:  Intraoperative TEE 10/18/2019: - Left Ventricle: The left ventricle is essentially unchanged from  pre-bypass.  - Right Ventricle: The right ventricle appears unchanged from pre-bypass.  -  Aorta: The aorta appears unchanged from pre-bypass.  - Left Atrium: The left atrium appears unchanged from pre-bypass.  - Left Atrial Appendage: The left atrial appendage appears unchanged from  pre-bypass.  - Aortic Valve: The aortic valve appears unchanged from pre-bypass.  - Mitral Valve: The mitral valve appears essentially unchanged from  pre-bypass.  - Tricuspid Valve: The tricuspid valve appears essentially unchanged from  pre-bypass.  - Interatrial Septum: The interatrial septum appears unchanged from  pre-bypass.  - Interventricular Septum: The interventricular septum appears unchanged  from  pre-bypass.  - Pericardium: The pericardium appears unchanged from pre-bypass.   US Doppler Pre CABG 10/17/2019: Right Carotid: Velocities in the right ICA are consistent with a 1-39%  stenosis.  Left Carotid: Velocities in the left ICA are consistent with a 1-39%  stenosis.  Vertebrals:  Bilateral vertebral arteries demonstrate antegrade flow.  Subclavians: Normal flow hemodynamics were seen in bilateral subclavian  arteries.  Right Upper Extremity: Doppler waveforms remain within normal limits with right radial compression. Doppler waveforms remain within normal limits with right ulnar compression.  Left Upper Extremity: Doppler waveforms remain within normal limits with left radial compression. Doppler waveforms remain within normal limits with left ulnar compression.   Echo 10/16/2019:  1. Left ventricular ejection fraction, by estimation, is 55 to 60%. The  left ventricle has normal function. The left ventricle has no regional  wall motion abnormalities. There is mild left ventricular hypertrophy of  the basal-septal segment. Left  ventricular diastolic parameters are consistent with Grade I diastolic  dysfunction (impaired relaxation).   2. Right ventricular systolic function is normal. The right ventricular  size is normal. Tricuspid regurgitation signal is inadequate for assessing  PA pressure.   3. Left atrial size was mildly dilated.   4. The mitral valve is normal in structure. No evidence of mitral valve  regurgitation. No evidence of mitral stenosis.   5. The aortic valve is normal in structure. Aortic valve regurgitation is  not visualized. No aortic stenosis is present.   6. Aortic dilatation noted. There is borderline dilatation of the aortic  root measuring 38 mm.   7. The inferior vena cava is normal in size with greater than 50%  respiratory variability, suggesting right atrial pressure of 3 mmHg.   Comparison(s): No significant change from prior study. Prior images  reviewed side by side.   LHC 10/16/2019: -- LEFT VENTRICULAR HEMODYNAMICS & VENTRICULOGRAPHY There is mild left ventricular systolic dysfunction. LV end diastolic pressure is normal. The left ventricular ejection fraction is 45-50% by visual estimate. -- CORONARY ANGIOGRAPHY Prox LAD to Mid LAD lesion is 99% stenosed with 50% stenosed side branch in 1st Diag. Prox Cx lesion is 90% stenosed. Dist RCA lesion is 80%  stenosed with 90% stenosed side branch in RPDA. SUMMARY  Severe heavily calcified multivessel disease: Heavily calcified proximal LAD subtotal occlusion (99%) lesion beginning just prior to major SP trunk, terminating after 1st Diag --> remainder the LAD is relatively free of disease with TIMI I flow and competitive flow from right left collaterals to the apex Heavily calcified very focal 90% eccentric stenosis in the proximal LCx--LCx gives off 2 major Mrg branches and a posterior AV groove branch with 2 small PL branches--free of disease. Heavily calcified dominant RCA with diffuse 30% mid to distal disease, and 80% focal stenosis just prior to RPAV continuing into the RPDA with 90% ostial PDA lesion. Mildly reduced LVEF of roughly 45% with mid to apical anterior hypokinesis RECOMMENDATION Admit under  Dr. Campbell Lerner service for medication optimization and CVTS consultation He will be started on IV Heparin while we continue to hold his Xarelto. Increase to Max dose Atorvastatin & adjust BP medications    Lexiscan Stress Test 10/04/2019: Nuclear stress EF: 49%. The left ventricular ejection fraction is mildly decreased (45-54%). Horizontal ST segment depression ST segment depression of 1.5 mm was noted during stress in the II, III, aVF, V5 and V6 leads, beginning at 7 minutes of stress, and returning to baseline after less than 1 minute of recovery. Findings consistent with ischemia. This is a high risk study.   1. Initial ECG treadmill stress did not reach target HR and was converted to pharmacological study. Despite not reaching target HR, the patient had 1.5 mm horizontal ST depression in inferior leads (II, III, AVF) and anterolateral leads (V5-6) and symptoms of chest pain. 2. On MPI imaging, there is a medium size (10-12% of the LV), moderate perfusion defect present on stress imaging in the apical anterior, and apex consistent with ischemia in the LAD distrubution.  3. LVEF is normal for  this modality, 49%.  4. Average exercise capacity (8:11 min:s; 7.0 METS). 5. Overall, this is a high risk study concerning for ischemia.  EKG:   03/28/2023: Normal sinus rhythm, rate 62 10/14/22: Normal sinus rhythm, rate 62, no ST abnormalities 02/20/21: NSR, rate 81, no ST abnormalities 08/18/2020: NSR, rate 57, No ST abnormalities 11/16/2019: normal sinus rhythm, rate 63, no ST abnormalities 10/09/2019: NSR, rate 72, no ST abnormalities  Recent Labs: 06/08/2023: BUN 13; Creatinine, Ser 1.07; Hemoglobin 14.7; Platelets 227; Potassium 5.1; Sodium 144  Recent Lipid Panel    Component Value Date/Time   CHOL 114 01/18/2020 0845   TRIG 74 01/18/2020 0845   HDL 48 01/18/2020 0845   CHOLHDL 2.4 01/18/2020 0845   CHOLHDL 3.6 10/18/2019 0511   VLDL 24 10/18/2019 0511   LDLCALC 51 01/18/2020 0845    Physical Exam:    VS:  BP 118/70   Pulse (!) 51   Ht 5\' 10"  (1.778 m)   Wt 193 lb (87.5 kg)   SpO2 99%   BMI 27.69 kg/m     Wt Readings from Last 3 Encounters:  06/21/23 193 lb (87.5 kg)  06/14/23 194 lb (88 kg)  05/30/23 198 lb 12.8 oz (90.2 kg)     GEN: in no acute distress HEENT: Normal NECK: No JVD; No carotid bruits CARDIAC: RRR, no murmurs, rubs, gallops RESPIRATORY:  Clear to auscultation without rales, wheezing or rhonchi  ABDOMEN: Soft, non-tender, non-distended MUSCULOSKELETAL:  No edema SKIN: Warm and dry NEUROLOGIC:  Alert and oriented x 3 PSYCHIATRIC:  Normal affect   ASSESSMENT:    1. Coronary artery disease of native artery of native heart with stable angina pectoris (HCC)   2. Arm swelling   3. Right arm pain   4. Pre-op evaluation   5. Essential hypertension   6. Hyperlipidemia, unspecified hyperlipidemia type        PLAN:    CAD: Cardiac catheterization on 10/16/2019 showed severe heavily calcified multivessel disease (99% proximal LAD, 90% proximal circumflex, 80% distal RCA, 90% RPDA).  Underwent CABG 10/18/19 (LIMA-LAD, SVG-OM, SVG sequential to PDA and  PLB).  Echocardiogram on 10/16/2019 showed normal biventricular function, no significant valvular disease.  Reported chest pain and underwent stress PET 05/05/2023, which showed large reversible severe lateral wall perfusion defect; high risk study.  LHC on 05/13/2023 showed patent LIMA-LAD, occluded SVG-OM and occluded sequential SVG to PDA to  PLV, occluded proximal LAD, high-grade proximal LCx disease, moderate distal RCA disease.  Echocardiogram 05/30/2023 showed EF 55 to 60%, normal RV function, no significant valvular disease.  Underwent PCI to LCx on 06/14/2023 with orbital atherectomy, Cutting Balloon angioplasty with drug-eluting stent placement. - Triple therapy with Xarelto, Plavix, aspirin recommended for 1 month, followed by 6 months of Xarelto and Plavix then indefinite Xarelto monotherapy -Continue atorvastatin 80 mg daily -Continue Toprol-XL 50 mg  R wrist pain/swelling: post PCI.  Has hematoma in RUE. Check RUE duplex  Preop evaluation: Reports had a fall and tore rotator cuff in left shoulder, states that he was seen by orthopedist and recommended surgery.  Given stress PET findings as above, LHC was done on 05/13/2023 which showed occluded vein grafts and high-grade proximal LCx stenosis.  Underwent PCI to LCx on 06/14/2023. -Plan to wait 6 months from PCI before stopping Plavix and patient okay to undergo surgery.  Would plan to start him on aspirin at that time and continue aspirin perioperatively   Hypertension: continue losartan 50 mg daily and Toprol-XL 50 mg daily as above   Hyperlipidemia: On atorvastatin 80 mg daily, LDL 39 on 06/04/22   Factor V Leiden: History of DVT and PE.  On Xarelto     Prediabetes: A1c 5.9% on 06/04/2022   RTC in 6 months   Medication Adjustments/Labs and Tests Ordered: Current medicines are reviewed at length with the patient today.  Concerns regarding medicines are outlined above.  Orders Placed This Encounter  Procedures   VAS [place] UPPER EXTREMITY  ARTERIAL DUPLEX    No orders of the defined types were placed in this encounter.    Patient Instructions  Medication Instructions:  TAKE PLAVIX (CLOPIDOGREL) ASPIRIN, AND XARELTO DAILY.  STOP ASPIRIN ON 07/14/2023   *If you need a refill on your cardiac medications before your next appointment, please call your pharmacy*  Lab Work: NONE   Testing/Procedures: Your physician has requested that you have a lower or upper extremity arterial duplex. This test is an ultrasound of the arteries in the legs or arms. It looks at arterial blood flow in the legs and arms. Allow one hour for Lower and Upper Arterial scans. There are no restrictions or special instructions.  Please note: We ask at that you not bring children with you during ultrasound (echo/ vascular) testing. Due to room size and safety concerns, children are not allowed in the ultrasound rooms during exams. Our front office staff cannot provide observation of children in our lobby area while testing is being conducted. An adult accompanying a patient to their appointment will only be allowed in the ultrasound room at the discretion of the ultrasound technician under special circumstances. We apologize for any inconvenience. RIGHT UPPER EXTREMITY  NEEDS AS SOON AS POSSIBLE   Follow-Up: At [facility], you and your health needs are our priority.  As part of our continuing mission to provide you with exceptional heart care, our providers are all part of one team.  This team includes your primary Cardiologist (physician) and Advanced Practice Providers or APPs (Physician Assistants and Nurse Practitioners) who all work together to provide you with the care you need, when you need it.  Your next appointment:   6 month(s)  Provider:   Little Ishikawa, MD    We recommend signing up for the patient portal called "MyChart".  Sign up information is provided on this After Visit Summary.  MyChart is used to connect with  patients for Virtual  Visits (Telemedicine).  Patients are able to view lab/test results, encounter notes, upcoming appointments, etc.  Non-urgent messages can be sent to your provider as well.   To learn more about what you can do with MyChart, go to [email or web address].            Signed, Little Ishikawa, MD  06/21/2023 11:59 PM    [facility]

## 2023-06-20 ENCOUNTER — Telehealth (HOSPITAL_COMMUNITY): Payer: Self-pay | Admitting: *Deleted

## 2023-06-20 NOTE — Telephone Encounter (Signed)
 Cardiac Rehab Phase II referral faxed to [facility] per pt request. Ethelda Chick BS, ACSM-CEP 06/20/2023 1:57 PM

## 2023-06-21 ENCOUNTER — Ambulatory Visit (HOSPITAL_BASED_OUTPATIENT_CLINIC_OR_DEPARTMENT_OTHER): Payer: Medicare Other | Admitting: Cardiology

## 2023-06-21 ENCOUNTER — Ambulatory Visit (HOSPITAL_BASED_OUTPATIENT_CLINIC_OR_DEPARTMENT_OTHER)

## 2023-06-21 ENCOUNTER — Encounter (HOSPITAL_BASED_OUTPATIENT_CLINIC_OR_DEPARTMENT_OTHER): Payer: Self-pay | Admitting: Cardiology

## 2023-06-21 VITALS — BP 118/70 | HR 51 | Ht 70.0 in | Wt 193.0 lb

## 2023-06-21 DIAGNOSIS — M79601 Pain in right arm: Secondary | ICD-10-CM

## 2023-06-21 DIAGNOSIS — I10 Essential (primary) hypertension: Secondary | ICD-10-CM

## 2023-06-21 DIAGNOSIS — I25118 Atherosclerotic heart disease of native coronary artery with other forms of angina pectoris: Secondary | ICD-10-CM | POA: Diagnosis not present

## 2023-06-21 DIAGNOSIS — M7989 Other specified soft tissue disorders: Secondary | ICD-10-CM | POA: Diagnosis not present

## 2023-06-21 DIAGNOSIS — Z01818 Encounter for other preprocedural examination: Secondary | ICD-10-CM | POA: Diagnosis not present

## 2023-06-21 DIAGNOSIS — E785 Hyperlipidemia, unspecified: Secondary | ICD-10-CM | POA: Diagnosis not present

## 2023-06-21 NOTE — Patient Instructions (Signed)
 Medication Instructions:  TAKE PLAVIX (CLOPIDOGREL) ASPIRIN, AND XARELTO DAILY.  STOP ASPIRIN ON 07/14/2023   *If you need a refill on your cardiac medications before your next appointment, please call your pharmacy*  Lab Work: NONE   Testing/Procedures: Your physician has requested that you have a lower or upper extremity arterial duplex. This test is an ultrasound of the arteries in the legs or arms. It looks at arterial blood flow in the legs and arms. Allow one hour for Lower and Upper Arterial scans. There are no restrictions or special instructions.  Please note: We ask at that you not bring children with you during ultrasound (echo/ vascular) testing. Due to room size and safety concerns, children are not allowed in the ultrasound rooms during exams. Our front office staff cannot provide observation of children in our lobby area while testing is being conducted. An adult accompanying a patient to their appointment will only be allowed in the ultrasound room at the discretion of the ultrasound technician under special circumstances. We apologize for any inconvenience. RIGHT UPPER EXTREMITY  NEEDS AS SOON AS POSSIBLE   Follow-Up: At [facility], you and your health needs are our priority.  As part of our continuing mission to provide you with exceptional heart care, our providers are all part of one team.  This team includes your primary Cardiologist (physician) and Advanced Practice Providers or APPs (Physician Assistants and Nurse Practitioners) who all work together to provide you with the care you need, when you need it.  Your next appointment:   6 month(s)  Provider:   Little Ishikawa, MD    We recommend signing up for the patient portal called "MyChart".  Sign up information is provided on this After Visit Summary.  MyChart is used to connect with patients for Virtual Visits (Telemedicine).  Patients are able to view lab/test results, encounter notes, upcoming  appointments, etc.  Non-urgent messages can be sent to your provider as well.   To learn more about what you can do with MyChart, go to [email or web address].

## 2023-06-24 ENCOUNTER — Telehealth: Payer: Self-pay | Admitting: Cardiology

## 2023-06-24 NOTE — Telephone Encounter (Signed)
 Office states they received the patient referral but they need a copy of the patient's EKG and cath report. Fax #[phone]. Please advise

## 2023-06-24 NOTE — Telephone Encounter (Signed)
 Called and spoke to patient and spoke to Kathlynn Grate, Rn at [facility] and number given for to request EKG from HIM [phone]. Jose Bowers

## 2023-06-29 DIAGNOSIS — H2513 Age-related nuclear cataract, bilateral: Secondary | ICD-10-CM | POA: Diagnosis not present

## 2023-06-29 DIAGNOSIS — H5203 Hypermetropia, bilateral: Secondary | ICD-10-CM | POA: Diagnosis not present

## 2023-07-11 DIAGNOSIS — Z955 Presence of coronary angioplasty implant and graft: Secondary | ICD-10-CM | POA: Diagnosis not present

## 2023-07-13 DIAGNOSIS — Z955 Presence of coronary angioplasty implant and graft: Secondary | ICD-10-CM | POA: Diagnosis not present

## 2023-07-14 DIAGNOSIS — Z955 Presence of coronary angioplasty implant and graft: Secondary | ICD-10-CM | POA: Diagnosis not present

## 2023-07-20 DIAGNOSIS — Z955 Presence of coronary angioplasty implant and graft: Secondary | ICD-10-CM | POA: Diagnosis not present

## 2023-07-21 DIAGNOSIS — Z955 Presence of coronary angioplasty implant and graft: Secondary | ICD-10-CM | POA: Diagnosis not present

## 2023-07-25 DIAGNOSIS — Z955 Presence of coronary angioplasty implant and graft: Secondary | ICD-10-CM | POA: Diagnosis not present

## 2023-08-03 DIAGNOSIS — Z955 Presence of coronary angioplasty implant and graft: Secondary | ICD-10-CM | POA: Diagnosis not present

## 2023-08-04 DIAGNOSIS — Z955 Presence of coronary angioplasty implant and graft: Secondary | ICD-10-CM | POA: Diagnosis not present

## 2023-08-10 DIAGNOSIS — Z955 Presence of coronary angioplasty implant and graft: Secondary | ICD-10-CM | POA: Diagnosis not present

## 2023-08-11 DIAGNOSIS — Z955 Presence of coronary angioplasty implant and graft: Secondary | ICD-10-CM | POA: Diagnosis not present

## 2023-08-15 DIAGNOSIS — Z955 Presence of coronary angioplasty implant and graft: Secondary | ICD-10-CM | POA: Diagnosis not present

## 2023-08-17 ENCOUNTER — Encounter (HOSPITAL_BASED_OUTPATIENT_CLINIC_OR_DEPARTMENT_OTHER): Payer: Self-pay | Admitting: Cardiology

## 2023-08-17 DIAGNOSIS — Z955 Presence of coronary angioplasty implant and graft: Secondary | ICD-10-CM | POA: Diagnosis not present

## 2023-08-17 NOTE — Telephone Encounter (Signed)
 Yes I can see the notes from rehab.  In regards to his surgery, yes we need to wait 6 months from his stent placement.  Can we schedule him in clinic sometime in September?

## 2023-08-18 DIAGNOSIS — Z955 Presence of coronary angioplasty implant and graft: Secondary | ICD-10-CM | POA: Diagnosis not present

## 2023-09-13 NOTE — Telephone Encounter (Signed)
Routing to correct triage

## 2023-09-14 DIAGNOSIS — L509 Urticaria, unspecified: Secondary | ICD-10-CM | POA: Diagnosis not present

## 2023-09-14 DIAGNOSIS — Z7901 Long term (current) use of anticoagulants: Secondary | ICD-10-CM | POA: Diagnosis not present

## 2023-09-14 DIAGNOSIS — T7840XA Allergy, unspecified, initial encounter: Secondary | ICD-10-CM | POA: Diagnosis not present

## 2023-09-14 DIAGNOSIS — I10 Essential (primary) hypertension: Secondary | ICD-10-CM | POA: Diagnosis not present

## 2023-09-14 DIAGNOSIS — Z79899 Other long term (current) drug therapy: Secondary | ICD-10-CM | POA: Diagnosis not present

## 2023-09-14 DIAGNOSIS — Z7982 Long term (current) use of aspirin: Secondary | ICD-10-CM | POA: Diagnosis not present

## 2023-09-19 DIAGNOSIS — S86011A Strain of right Achilles tendon, initial encounter: Secondary | ICD-10-CM | POA: Diagnosis not present

## 2023-10-17 DIAGNOSIS — S46012D Strain of muscle(s) and tendon(s) of the rotator cuff of left shoulder, subsequent encounter: Secondary | ICD-10-CM | POA: Diagnosis not present

## 2023-11-01 DIAGNOSIS — H00022 Hordeolum internum right lower eyelid: Secondary | ICD-10-CM | POA: Diagnosis not present

## 2023-11-20 NOTE — Progress Notes (Unsigned)
 Cardiology Office Note:    Date:  11/20/2023   ID:  Jose Bowers, DOB 19-Feb-1951,  [id number]  PCP:  Ransom Other, MD  Cardiologist:  Lonni LITTIE Nanas, MD  Electrophysiologist:  None   Referring MD: Ransom Other, MD   No chief complaint on file.   History of Present Illness:    Jose Bowers is a 73 y.o. male with a hx of CAD status post CABG (LIMA-LAD, SVG-OM, SVG sequential to PDA and PLB) factor V Leiden, hypertension, hyperlipidemia, DVT following shoulder surgery in 2014, saddle PE treated with EKOS in 2019 who presents for follow-up.  He was referred by Dr. Husain for evaluation of chest pain, initially seen on 09/19/2019.  Exercise Myoview  was attempted on 10/04/2019.  He did not reach target heart rate on treadmill and was converted to pharmacologic study.  Despite not reaching target heart rate, he had 1.5 mm horizontal ST depressions in inferior and anterolateral leads and symptoms of chest pain.  MPI imaging showed reversible defect in apical anterior wall and apex consistent with LAD ischemia.  LVEF 49%.  Cardiac catheterization on 10/16/2019 showed severe heavily calcified multivessel disease (99% proximal LAD, 90% proximal circumflex, 80% distal RCA, 90% RPDA).  Underwent CABG 10/18/19 ((LIMA-LAD, SVG-OM, SVG sequential to PDA and PLB).  Echocardiogram 10/16/2019 showed normal biventricular function, no significant valvular disease.  Reported chest pain and underwent stress PET 05/05/2023, which showed large reversible severe lateral wall perfusion defect; high risk study.  LHC on 05/13/2023 showed patent LIMA-LAD, occluded SVG-OM and occluded sequential SVG to PDA to PLV, occluded proximal LAD, high-grade proximal LCx disease, moderate distal RCA disease.  Echocardiogram 05/30/2023 showed EF 55 to 60%, normal RV function, no significant valvular disease.  Underwent PCI to LCx on 06/14/2023 with orbital atherectomy, Cutting Balloon angioplasty with drug-eluting stent  placement.  Since last clinic visit, he reports he is doing well. Not  having any chest pain, reports completely resolved after his procedure.  Does report having pain and swelling in right wrist. No weakness or loss of sensation.   Wt Readings from Last 3 Encounters:  06/21/23 193 lb (87.5 kg)  06/14/23 194 lb (88 kg)  05/30/23 198 lb 12.8 oz (90.2 kg)    BP Readings from Last 3 Encounters:  06/21/23 118/70  06/14/23 125/81  05/30/23 130/66     Past Medical History:  Diagnosis Date   Factor V Leiden (HCC)    Hypertension     Past Surgical History:  Procedure Laterality Date   CORONARY ARTERY BYPASS GRAFT N/A 10/18/2019   Procedure: CORONARY ARTERY BYPASS GRAFTING (CABG), ON PUMP, TIMES FOUR, USING LEFT INTERAL MAMMARY ATERY AND ENDOSCOPICALLY HARVESTED RIGHT GREATER SAPHENOUS VEIN;  Surgeon: Lucas Dorise POUR, MD;  Location: [facility] OR;  Service: Open Heart Surgery;  Laterality: N/A;   CORONARY ATHERECTOMY  06/14/2023   Procedure: CORONARY ATHERECTOMY;  Surgeon: Wendel Lurena POUR, MD;  Location: [facility] INVASIVE CV LAB;  Service: Cardiovascular;;   CORONARY STENT INTERVENTION N/A 06/14/2023   Procedure: CORONARY STENT INTERVENTION;  Surgeon: Wendel Lurena POUR, MD;  Location: [facility] INVASIVE CV LAB;  Service: Cardiovascular;  Laterality: N/A;   HERNIA REPAIR     IR ANGIOGRAM PULMONARY BILATERAL SELECTIVE  03/19/2017   IR ANGIOGRAM SELECTIVE EACH ADDITIONAL VESSEL  03/19/2017   IR ANGIOGRAM SELECTIVE EACH ADDITIONAL VESSEL  03/19/2017   IR INFUSION THROMBOL ARTERIAL INITIAL (MS)  03/19/2017   IR INFUSION THROMBOL ARTERIAL INITIAL (MS)  03/19/2017   IR THROMB F/U EVAL ART/VEN FINAL DAY (  MS)  03/20/2017   IR US  GUIDE VASC ACCESS RIGHT  03/19/2017   JOINT REPLACEMENT     LEFT HEART CATH AND CORONARY ANGIOGRAPHY N/A 10/16/2019   Procedure: LEFT HEART CATH AND CORONARY ANGIOGRAPHY;  Surgeon: Anner Alm ORN, MD;  Location: [facility] INVASIVE CV LAB;  Service: Cardiovascular;  Laterality: N/A;   LEFT HEART CATH AND CORONARY  ANGIOGRAPHY N/A 06/14/2023   Procedure: LEFT HEART CATH AND CORONARY ANGIOGRAPHY;  Surgeon: Wendel Lurena POUR, MD;  Location: [facility] INVASIVE CV LAB;  Service: Cardiovascular;  Laterality: N/A;   LEFT HEART CATH AND CORS/GRAFTS ANGIOGRAPHY N/A 05/13/2023   Procedure: LEFT HEART CATH AND CORS/GRAFTS ANGIOGRAPHY;  Surgeon: Wendel Lurena POUR, MD;  Location: [facility] INVASIVE CV LAB;  Service: Cardiovascular;  Laterality: N/A;   ROTATOR CUFF REPAIR     TEE WITHOUT CARDIOVERSION N/A 10/18/2019   Procedure: TRANSESOPHAGEAL ECHOCARDIOGRAM (TEE);  Surgeon: Lucas Dorise POUR, MD;  Location[facility] OR;  Service: Open Heart Surgery;  Laterality: N/A;    Current Medications: No outpatient medications have been marked as taking for the 11/23/23 encounter (Appointment) with Kate Lonni CROME, MD.     Allergies:   Patient has no known allergies.   Social History   Socioeconomic History   Marital status: Married    Spouse name: Not on file   Number of children: Not on file   Years of education: Not on file   Highest education level: Not on file  Occupational History   Occupation: retired     Comment: Dentist   Tobacco Use   Smoking status: Never   Smokeless tobacco: Never  Vaping Use   Vaping status: Never Used  Substance and Sexual Activity   Alcohol use: Yes    Comment: few times a week   Drug use: No   Sexual activity: Not on file  Other Topics Concern   Not on file  Social History Narrative   Not on file   Social Drivers of Health   Financial Resource Strain: Not on file  Food Insecurity: Not on file  Transportation Needs: Not on file  Physical Activity: Not on file  Stress: Not on file  Social Connections: Not on file     Family History: The patient's family history includes Clotting disorder in his brother, father, grandchild, and son.  ROS:   Please see the history of present illness.     All other systems reviewed and are negative.  EKGs/Labs/Other Studies Reviewed:     The following studies were reviewed today:  Intraoperative TEE 10/18/2019: - Left Ventricle: The left ventricle is essentially unchanged from  pre-bypass.  - Right Ventricle: The right ventricle appears unchanged from pre-bypass.  - Aorta: The aorta appears unchanged from pre-bypass.  - Left Atrium: The left atrium appears unchanged from pre-bypass.  - Left Atrial Appendage: The left atrial appendage appears unchanged from  pre-bypass.  - Aortic Valve: The aortic valve appears unchanged from pre-bypass.  - Mitral Valve: The mitral valve appears essentially unchanged from  pre-bypass.  - Tricuspid Valve: The tricuspid valve appears essentially unchanged from  pre-bypass.  - Interatrial Septum: The interatrial septum appears unchanged from  pre-bypass.  - Interventricular Septum: The interventricular septum appears unchanged  from  pre-bypass.  - Pericardium: The pericardium appears unchanged from pre-bypass.   US  Doppler Pre CABG 10/17/2019: Right Carotid: Velocities in the right ICA are consistent with a 1-39%  stenosis.  Left Carotid: Velocities in the left ICA are consistent with a 1-39%  stenosis.  Vertebrals:  Bilateral vertebral arteries demonstrate antegrade flow.  Subclavians: Normal flow hemodynamics were seen in bilateral subclavian arteries.  Right Upper Extremity: Doppler waveforms remain within normal limits with right radial compression. Doppler waveforms remain within normal limits with right ulnar compression.  Left Upper Extremity: Doppler waveforms remain within normal limits with left radial compression. Doppler waveforms remain within normal limits with left ulnar compression.   Echo 10/16/2019:  1. Left ventricular ejection fraction, by estimation, is 55 to 60%. The  left ventricle has normal function. The left ventricle has no regional  wall motion abnormalities. There is mild left ventricular hypertrophy of  the basal-septal segment. Left  ventricular  diastolic parameters are consistent with Grade I diastolic  dysfunction (impaired relaxation).   2. Right ventricular systolic function is normal. The right ventricular  size is normal. Tricuspid regurgitation signal is inadequate for assessing  PA pressure.   3. Left atrial size was mildly dilated.   4. The mitral valve is normal in structure. No evidence of mitral valve  regurgitation. No evidence of mitral stenosis.   5. The aortic valve is normal in structure. Aortic valve regurgitation is  not visualized. No aortic stenosis is present.   6. Aortic dilatation noted. There is borderline dilatation of the aortic  root measuring 38 mm.   7. The inferior vena cava is normal in size with greater than 50%  respiratory variability, suggesting right atrial pressure of 3 mmHg.   Comparison(s): No significant change from prior study. Prior images  reviewed side by side.   LHC 10/16/2019: -- LEFT VENTRICULAR HEMODYNAMICS & VENTRICULOGRAPHY There is mild left ventricular systolic dysfunction. LV end diastolic pressure is normal. The left ventricular ejection fraction is 45-50% by visual estimate. -- CORONARY ANGIOGRAPHY Prox LAD to Mid LAD lesion is 99% stenosed with 50% stenosed side branch in 1st Diag. Prox Cx lesion is 90% stenosed. Dist RCA lesion is 80% stenosed with 90% stenosed side branch in RPDA. SUMMARY  Severe heavily calcified multivessel disease: Heavily calcified proximal LAD subtotal occlusion (99%) lesion beginning just prior to major SP trunk, terminating after 1st Diag --> remainder the LAD is relatively free of disease with TIMI I flow and competitive flow from right left collaterals to the apex Heavily calcified very focal 90% eccentric stenosis in the proximal LCx--LCx gives off 2 major Mrg branches and a posterior AV groove branch with 2 small PL branches--free of disease. Heavily calcified dominant RCA with diffuse 30% mid to distal disease, and 80% focal stenosis just  prior to RPAV continuing into the RPDA with 90% ostial PDA lesion. Mildly reduced LVEF of roughly 45% with mid to apical anterior hypokinesis RECOMMENDATION Admit under Dr. Alvan service for medication optimization and CVTS consultation He will be started on IV Heparin  while we continue to hold his Xarelto . Increase to Max dose Atorvastatin  & adjust BP medications    Lexiscan  Stress Test 10/04/2019: Nuclear stress EF: 49%. The left ventricular ejection fraction is mildly decreased (45-54%). Horizontal ST segment depression ST segment depression of 1.5 mm was noted during stress in the II, III, aVF, V5 and V6 leads, beginning at 7 minutes of stress, and returning to baseline after less than 1 minute of recovery. Findings consistent with ischemia. This is a high risk study.   1. Initial ECG treadmill stress did not reach target HR and was converted to pharmacological study. Despite not reaching target HR, the patient had 1.5 mm horizontal ST depression in inferior leads (II, III,  AVF) and anterolateral leads (V5-6) and symptoms of chest pain. 2. On MPI imaging, there is a medium size (10-12% of the LV), moderate perfusion defect present on stress imaging in the apical anterior, and apex consistent with ischemia in the LAD distrubution.  3. LVEF is normal for this modality, 49%.  4. Average exercise capacity (8:11 min:s; 7.0 METS). 5. Overall, this is a high risk study concerning for ischemia.  EKG:   03/28/2023: Normal sinus rhythm, rate 62 10/14/22: Normal sinus rhythm, rate 62, no ST abnormalities 02/20/21: NSR, rate 81, no ST abnormalities 08/18/2020: NSR, rate 57, No ST abnormalities 11/16/2019: normal sinus rhythm, rate 63, no ST abnormalities 10/09/2019: NSR, rate 72, no ST abnormalities  Recent Labs: 06/08/2023: BUN 13; Creatinine, Ser 1.07; Hemoglobin 14.7; Platelets 227; Potassium 5.1; Sodium 144  Recent Lipid Panel    Component Value Date/Time   CHOL 114 01/18/2020 0845   TRIG  74 01/18/2020 0845   HDL 48 01/18/2020 0845   CHOLHDL 2.4 01/18/2020 0845   CHOLHDL 3.6 10/18/2019 0511   VLDL 24 10/18/2019 0511   LDLCALC 51 01/18/2020 0845    Physical Exam:    VS:  There were no vitals taken for this visit.    Wt Readings from Last 3 Encounters:  06/21/23 193 lb (87.5 kg)  06/14/23 194 lb (88 kg)  05/30/23 198 lb 12.8 oz (90.2 kg)     GEN: in no acute distress HEENT: Normal NECK: No JVD; No carotid bruits CARDIAC: RRR, no murmurs, rubs, gallops RESPIRATORY:  Clear to auscultation without rales, wheezing or rhonchi  ABDOMEN: Soft, non-tender, non-distended MUSCULOSKELETAL:  No edema SKIN: Warm and dry NEUROLOGIC:  Alert and oriented x 3 PSYCHIATRIC:  Normal affect   ASSESSMENT:    No diagnosis found.      PLAN:    CAD: Cardiac catheterization on 10/16/2019 showed severe heavily calcified multivessel disease (99% proximal LAD, 90% proximal circumflex, 80% distal RCA, 90% RPDA).  Underwent CABG 10/18/19 (LIMA-LAD, SVG-OM, SVG sequential to PDA and PLB).  Echocardiogram on 10/16/2019 showed normal biventricular function, no significant valvular disease.  Reported chest pain and underwent stress PET 05/05/2023, which showed large reversible severe lateral wall perfusion defect; high risk study.  LHC on 05/13/2023 showed patent LIMA-LAD, occluded SVG-OM and occluded sequential SVG to PDA to PLV, occluded proximal LAD, high-grade proximal LCx disease, moderate distal RCA disease.  Echocardiogram 05/30/2023 showed EF 55 to 60%, normal RV function, no significant valvular disease.  Underwent PCI to LCx on 06/14/2023 with orbital atherectomy, Cutting Balloon angioplasty with drug-eluting stent placement. - Triple therapy with Xarelto , Plavix , aspirin  recommended for 1 month, followed by 6 months of Xarelto  and Plavix  then indefinite Xarelto  monotherapy -Continue atorvastatin  80 mg daily -Continue Toprol -XL 50 mg  Preop evaluation: Reports had a fall and tore rotator cuff  in left shoulder, states that he was seen by orthopedist and recommended surgery.  Given stress PET findings as above, LHC was done on 05/13/2023 which showed occluded vein grafts and high-grade proximal LCx stenosis.  Underwent PCI to LCx on 06/14/2023. -Plan to wait 6 months from PCI before stopping Plavix  and patient okay to undergo surgery.  Would plan to start him on aspirin  at that time and continue aspirin  perioperatively   Hypertension: continue losartan  50 mg daily and Toprol -XL 50 mg daily as above   Hyperlipidemia: On atorvastatin  80 mg daily, LDL 39 on 06/04/22   Factor V Leiden: History of DVT and PE.  On Xarelto   Prediabetes: A1c 5.9% on 06/04/2022   RTC in 6 months***   Medication Adjustments/Labs and Tests Ordered: Current medicines are reviewed at length with the patient today.  Concerns regarding medicines are outlined above.  No orders of the defined types were placed in this encounter.   No orders of the defined types were placed in this encounter.    There are no Patient Instructions on file for this visit.    Signed, Lonni LITTIE Nanas, MD  11/20/2023 1:59 PM    [facility]

## 2023-11-22 ENCOUNTER — Telehealth: Payer: Self-pay

## 2023-11-22 NOTE — Telephone Encounter (Signed)
   Pre-operative Risk Assessment    Patient Name: Jose Bowers  DOB: [birth year] MRN: [id number]   Date of last office visit: 06/21/23 LONNI NANAS, MD Date of next office visit: 11/23/23 LONNI NANAS, MD   Request for Surgical Clearance    Procedure:  LEFT REVERSE TOTAL SHOULDER ARTHROPLASTY  Date of Surgery:  Clearance TBD                                Surgeon:  BONNER HAIR, MD Surgeon's Group or Practice Name:  [facility] Phone number:  [phone]   EXT 3132 Fax number:  [phone]   ATTN: MAEOLA DIVERS   Type of Clearance Requested:   - Medical  - Pharmacy:  Hold Aspirin , Clopidogrel  (Plavix ), and Rivaroxaban  (Xarelto )     Type of Anesthesia:  General   WITH INTERSCALENE BLOCK   Additional requests/questions:    Signed, Lucie DELENA Ku   11/22/2023, 4:30 PM

## 2023-11-23 ENCOUNTER — Ambulatory Visit (HOSPITAL_BASED_OUTPATIENT_CLINIC_OR_DEPARTMENT_OTHER): Admitting: Cardiology

## 2023-11-23 ENCOUNTER — Encounter (HOSPITAL_BASED_OUTPATIENT_CLINIC_OR_DEPARTMENT_OTHER): Payer: Self-pay | Admitting: Cardiology

## 2023-11-23 VITALS — BP 136/72 | HR 49 | Resp 16 | Ht 70.0 in | Wt 196.0 lb

## 2023-11-23 DIAGNOSIS — I10 Essential (primary) hypertension: Secondary | ICD-10-CM

## 2023-11-23 DIAGNOSIS — Z01818 Encounter for other preprocedural examination: Secondary | ICD-10-CM | POA: Diagnosis not present

## 2023-11-23 DIAGNOSIS — I2510 Atherosclerotic heart disease of native coronary artery without angina pectoris: Secondary | ICD-10-CM | POA: Diagnosis not present

## 2023-11-23 DIAGNOSIS — E785 Hyperlipidemia, unspecified: Secondary | ICD-10-CM

## 2023-11-23 MED ORDER — METOPROLOL SUCCINATE ER 25 MG PO TB24: 25.0000 mg | ORAL_TABLET | Freq: Every day | ORAL | 3 refills | Status: AC

## 2023-11-23 MED ORDER — CLOPIDOGREL BISULFATE 75 MG PO TABS: 75.0000 mg | ORAL_TABLET | Freq: Every day | ORAL | Status: AC

## 2023-11-23 NOTE — Patient Instructions (Signed)
 Medication Instructions:  DECREASE METOPROLOL  TO 25 MG DAILY   STARTING 10/1 STOP CLOPIDOGREL  AND START ASPIRIN  81 MG DAILY   OK TO HOLD YOUR XARELTO  3 DAYS PRIOR TO SURGERY 10/22, RESUME WHEN SURGEONS ADVISES  CONTINUE ASPIRIN , DO NOT STOP PRIOR TO SURGERY   *If you need a refill on your cardiac medications before your next appointment, please call your pharmacy*  Lab Work: NONE   Testing/Procedures: NONE  Follow-Up: At [facility], you and your health needs are our priority.  As part of our continuing mission to provide you with exceptional heart care, our providers are all part of one team.  This team includes your primary Cardiologist (physician) and Advanced Practice Providers or APPs (Physician Assistants and Nurse Practitioners) who all work together to provide you with the care you need, when you need it.  Your next appointment:   3 month(s)  Provider:   DR KATE OR APP    We recommend signing up for the patient portal called MyChart.  Sign up information is provided on this After Visit Summary.  MyChart is used to connect with patients for Virtual Visits (Telemedicine).  Patients are able to view lab/test results, encounter notes, upcoming appointments, etc.  Non-urgent messages can be sent to your provider as well.   To learn more about what you can do with MyChart, go to [email or web address].

## 2023-11-25 NOTE — Telephone Encounter (Signed)
 Per Dr. Kate note, pt to hold Xarelto  for 3 days prior to surgery

## 2023-11-25 NOTE — Telephone Encounter (Signed)
   Patient Name: Jose Bowers  DOB: November 05, 1950 MRN: [id number]  Primary Cardiologist: Lonni LITTIE Nanas, MD  Chart reviewed as part of pre-operative protocol coverage. Given past medical history and time since last visit, based on ACC/AHA guidelines, [name] is at acceptable risk for the planned procedure without further cardiovascular testing.   Per Dr. Nanas note, pt to hold Xarelto  for 3 days prior to surgery.  Per office protocol, if patient is without any new symptoms or concerns at the time of their virtual visit, he may hold Plavix  for 5 days and Aspirin  for 7 days prior to procedure. Please resume Aspirin , Plavix  and Xarelto  as soon as possible postprocedure, at the discretion of the surgeon.    The patient was advised that if he develops new symptoms prior to surgery to contact our office to arrange for a follow-up visit, and he verbalized understanding.  I will route this recommendation to the requesting party via Epic fax function and remove from pre-op pool.  Please call with questions.  Lamarr Satterfield, NP 11/25/2023, 3:37 PM

## 2023-12-13 DIAGNOSIS — R972 Elevated prostate specific antigen [PSA]: Secondary | ICD-10-CM | POA: Diagnosis not present

## 2023-12-13 DIAGNOSIS — M75102 Unspecified rotator cuff tear or rupture of left shoulder, not specified as traumatic: Secondary | ICD-10-CM | POA: Diagnosis not present

## 2023-12-13 DIAGNOSIS — Z86711 Personal history of pulmonary embolism: Secondary | ICD-10-CM | POA: Diagnosis not present

## 2023-12-13 DIAGNOSIS — R7303 Prediabetes: Secondary | ICD-10-CM | POA: Diagnosis not present

## 2023-12-13 DIAGNOSIS — E7800 Pure hypercholesterolemia, unspecified: Secondary | ICD-10-CM | POA: Diagnosis not present

## 2023-12-13 DIAGNOSIS — D682 Hereditary deficiency of other clotting factors: Secondary | ICD-10-CM | POA: Diagnosis not present

## 2023-12-13 DIAGNOSIS — I2510 Atherosclerotic heart disease of native coronary artery without angina pectoris: Secondary | ICD-10-CM | POA: Diagnosis not present

## 2023-12-13 DIAGNOSIS — Z951 Presence of aortocoronary bypass graft: Secondary | ICD-10-CM | POA: Diagnosis not present

## 2023-12-13 DIAGNOSIS — I10 Essential (primary) hypertension: Secondary | ICD-10-CM | POA: Diagnosis not present

## 2023-12-15 NOTE — Progress Notes (Signed)
 Sent message, via epic in basket, requesting orders in epic from Careers adviser.

## 2023-12-21 NOTE — Patient Instructions (Addendum)
 SURGICAL WAITING ROOM VISITATION  Patients having surgery or a procedure may have no more than 2 support people in the waiting area - these visitors may rotate.    Children under the age of 75 must have an adult with them who is not the patient.  Visitors with respiratory illnesses are discouraged from visiting and should remain at home.  If the patient needs to stay at the hospital during part of their recovery, the visitor guidelines for inpatient rooms apply. Pre-op nurse will coordinate an appropriate time for 1 support person to accompany patient in pre-op.  This support person may not rotate.    Please refer to the [facility] website for the visitor guidelines for Inpatients (after your surgery is over and you are in a regular room).       Your procedure is scheduled on: 01/04/24   Report to [facility] Main Entrance    Report to admitting at: 8:30 AM   Call this number if you have problems the morning of surgery [phone]   Do not eat food :After Midnight.   After Midnight you may have the following liquids until : 8:00 AM DAY OF SURGERY  Water Non-Citrus Juices (without pulp, NO RED-Apple, White grape, White cranberry) Black Coffee (NO MILK/CREAM OR CREAMERS, sugar ok)  Clear Tea (NO MILK/CREAM OR CREAMERS, sugar ok) regular and decaf                             Plain Jell-O (NO RED)                                           Fruit ices (not with fruit pulp, NO RED)                                     Popsicles (NO RED)                                                               Sports drinks like Gatorade (NO RED)              FOLLOW  ANY ADDITIONAL PRE OP INSTRUCTIONS YOU RECEIVED FROM YOUR SURGEON'S OFFICE!!!   Oral Hygiene is also important to reduce your risk of infection.                                    Remember - BRUSH YOUR TEETH THE MORNING OF SURGERY WITH YOUR REGULAR TOOTHPASTE  DENTURES WILL BE REMOVED PRIOR TO SURGERY PLEASE DO NOT APPLY  Poly grip OR ADHESIVES!!!   Do NOT smoke after Midnight   Stop all vitamins and herbal supplements 7 days before surgery.   Take these medicines the morning of surgery with A SIP OF WATER: metoprolol .   Before surgery.Stop taking ___________on __________as instructed by _____________.  Stop taking ____________as directed by your Surgeon/Cardiologist.  Contact your Surgeon/Cardiologist for instructions on Anticoagulant Therapy prior to surgery.   Bring CPAP mask and tubing day  of surgery.                              You may not have any metal on your body including hair pins, jewelry, and body piercing             Do not wear lotions, powders, perfumes/cologne, or deodorant              Men may shave face and neck.   Do not bring valuables to the hospital. [place] [facility] IS NOT             RESPONSIBLE   FOR VALUABLES.   Contacts, glasses, dentures or bridgework may not be worn into surgery.   Bring small overnight bag day of surgery.   DO NOT BRING YOUR HOME MEDICATIONS TO THE HOSPITAL. PHARMACY WILL DISPENSE MEDICATIONS LISTED ON YOUR MEDICATION LIST TO YOU DURING YOUR ADMISSION IN THE HOSPITAL!    Patients discharged on the day of surgery will not be allowed to drive home.  Someone NEEDS to stay with you for the first 24 hours after anesthesia.   Special Instructions: Bring a copy of your healthcare power of attorney and living will documents the day of surgery if you haven't scanned them before.              Please read over the following fact sheets you were given: IF YOU HAVE QUESTIONS ABOUT YOUR PRE-OP INSTRUCTIONS PLEASE CALL [phone].   If you received a COVID test during your pre-op visit  it is requested that you wear a mask when out in public, stay away from anyone that may not be feeling well and notify your surgeon if you develop symptoms. If you test positive for Covid or have been in contact with anyone that has tested positive in the last 10 days please notify  you surgeon.    [facility]- Preparing for Total Shoulder Arthroplasty    Before surgery, you can play an important role. Because skin is not sterile, your skin needs to be as free of germs as possible. You can reduce the number of germs on your skin by using the following products. Benzoyl Peroxide Gel Reduces the number of germs present on the skin Applied twice a day to shoulder area starting two days before surgery    ==================================================================  Please follow these instructions carefully:  BENZOYL PEROXIDE 5% GEL  Please do not use if you have an allergy to benzoyl peroxide.   If your skin becomes reddened/irritated stop using the benzoyl peroxide.  Starting two days before surgery, apply as follows: Apply benzoyl peroxide in the morning and at night. Apply after taking a shower. If you are not taking a shower clean entire shoulder front, back, and side along with the armpit with a clean wet washcloth.  Place a quarter-sized dollop on your shoulder and rub in thoroughly, making sure to cover the front, back, and side of your shoulder, along with the armpit.   2 days before ____ AM   ____ PM              1 day before ____ AM   ____ PM                         Do this twice a day for two days.  (Last application is the night before surgery, AFTER using the CHG soap as described below).  Do NOT apply benzoyl peroxide gel on the day of surgery.  Pre-operative 4 CHG Bath Instructions  DYNA-Hex 4 Chlorhexidine  Gluconate 4% Solution Antiseptic 4 fl. oz   You can play a key role in reducing the risk of infection after surgery. Your skin needs to be as free of germs as possible. You can reduce the number of germs on your skin by washing with CHG (chlorhexidine  gluconate) soap before surgery. CHG is an antiseptic soap that kills germs and continues to kill germs even after washing.   DO NOT use if you have an allergy to chlorhexidine /CHG or  antibacterial soaps. If your skin becomes reddened or irritated, stop using the CHG and notify one of our RNs at   Please shower with the CHG soap starting 4 days before surgery using the following schedule:     Please keep in mind the following:  DO NOT shave, including legs and underarms, starting the day of your first shower.   You may shave your face at any point before/day of surgery.  Place clean sheets on your bed the day you start using CHG soap. Use a clean washcloth (not used since being washed) for each shower. DO NOT sleep with pets once you start using the CHG.  CHG Shower Instructions:  If you choose to wash your hair and private area, wash first with your normal shampoo/soap.  After you use shampoo/soap, rinse your hair and body thoroughly to remove shampoo/soap residue.  Turn the water OFF and apply about 3 tablespoons (45 ml) of CHG soap to a CLEAN washcloth.  Apply CHG soap ONLY FROM YOUR NECK DOWN TO YOUR TOES (washing for 3-5 minutes)  DO NOT use CHG soap on face, private areas, open wounds, or sores.  Pay special attention to the area where your surgery is being performed.  If you are having back surgery, having someone wash your back for you may be helpful. Wait 2 minutes after CHG soap is applied, then you may rinse off the CHG soap.  Pat dry with a clean towel  Put on clean clothes/pajamas   If you choose to wear lotion, please use ONLY the CHG-compatible lotions on the back of this paper.     Additional instructions for the day of surgery: DO NOT APPLY any lotions, deodorants, cologne, or perfumes.   Put on clean/comfortable clothes.  Brush your teeth.  Ask your nurse before applying any prescription medications to the skin.   CHG Compatible Lotions   Aveeno Moisturizing lotion  Cetaphil Moisturizing Cream  Cetaphil Moisturizing Lotion  Clairol Herbal Essence Moisturizing Lotion, Dry Skin  Clairol Herbal Essence Moisturizing Lotion, Extra Dry Skin   Clairol Herbal Essence Moisturizing Lotion, Normal Skin  Curel Age Defying Therapeutic Moisturizing Lotion with Alpha Hydroxy  Curel Extreme Care Body Lotion  Curel Soothing Hands Moisturizing Hand Lotion  Curel Therapeutic Moisturizing Cream, Fragrance-Free  Curel Therapeutic Moisturizing Lotion, Fragrance-Free  Curel Therapeutic Moisturizing Lotion, Original Formula  Eucerin Daily Replenishing Lotion  Eucerin Dry Skin Therapy Plus Alpha Hydroxy Crme  Eucerin Dry Skin Therapy Plus Alpha Hydroxy Lotion  Eucerin Original Crme  Eucerin Original Lotion  Eucerin Plus Crme Eucerin Plus Lotion  Eucerin TriLipid Replenishing Lotion  Keri Anti-Bacterial Hand Lotion  Keri Deep Conditioning Original Lotion Dry Skin Formula Softly Scented  Keri Deep Conditioning Original Lotion, Fragrance Free Sensitive Skin Formula  Keri Lotion Fast Absorbing Fragrance Free Sensitive Skin Formula  Keri Lotion Fast Absorbing Softly Scented Dry Skin Formula  Keri Original  Lotion  [employer] Skin Renewal Lotion Keri Silky Smooth Lotion  Keri Silky Smooth Sensitive Skin Lotion  Nivea Body Creamy Conditioning Oil  Nivea Body Extra Enriched Teacher, adult education Moisturizing Lotion Nivea Crme  Nivea Skin Firming Lotion  NutraDerm 30 Skin Lotion  NutraDerm Skin Lotion  NutraDerm Therapeutic Skin Cream  NutraDerm Therapeutic Skin Lotion  ProShield Protective Hand Cream  Provon moisturizing lotion  Incentive Spirometer  An incentive spirometer is a tool that can help keep your lungs clear and active. This tool measures how well you are filling your lungs with each breath. Taking long deep breaths may help reverse or decrease the chance of developing breathing (pulmonary) problems (especially infection) following: A long period of time when you are unable to move or be active. BEFORE THE PROCEDURE  If the spirometer includes an indicator to show your best effort, your nurse or  respiratory therapist will set it to a desired goal. If possible, sit up straight or lean slightly forward. Try not to slouch. Hold the incentive spirometer in an upright position. INSTRUCTIONS FOR USE  Sit on the edge of your bed if possible, or sit up as far as you can in bed or on a chair. Hold the incentive spirometer in an upright position. Breathe out normally. Place the mouthpiece in your mouth and seal your lips tightly around it. Breathe in slowly and as deeply as possible, raising the piston or the ball toward the top of the column. Hold your breath for 3-5 seconds or for as long as possible. Allow the piston or ball to fall to the bottom of the column. Remove the mouthpiece from your mouth and breathe out normally. Rest for a few seconds and repeat Steps 1 through 7 at least 10 times every 1-2 hours when you are awake. Take your time and take a few normal breaths between deep breaths. The spirometer may include an indicator to show your best effort. Use the indicator as a goal to work toward during each repetition. After each set of 10 deep breaths, practice coughing to be sure your lungs are clear. If you have an incision (the cut made at the time of surgery), support your incision when coughing by placing a pillow or rolled up towels firmly against it. Once you are able to get out of bed, walk around indoors and cough well. You may stop using the incentive spirometer when instructed by your caregiver.  RISKS AND COMPLICATIONS Take your time so you do not get dizzy or light-headed. If you are in pain, you may need to take or ask for pain medication before doing incentive spirometry. It is harder to take a deep breath if you are having pain. AFTER USE Rest and breathe slowly and easily. It can be helpful to keep track of a log of your progress. Your caregiver can provide you with a simple table to help with this. If you are using the spirometer at home, follow these  instructions: SEEK MEDICAL CARE IF:  You are having difficultly using the spirometer. You have trouble using the spirometer as often as instructed. Your pain medication is not giving enough relief while using the spirometer. You develop fever of 100.5 F (38.1 C) or higher. SEEK IMMEDIATE MEDICAL CARE IF:  You cough up bloody sputum that had not been present before. You develop fever of 102 F (38.9 C) or greater. You develop worsening pain at or near the incision site. MAKE SURE  YOU:  Understand these instructions. Will watch your condition. Will get help right away if you are not doing well or get worse. Document Released: 07/12/2006 Document Revised: 05/24/2011 Document Reviewed: 09/12/2006 [facility] Patient Information 2014 [place], [place].   ________________________________________________________________________

## 2023-12-22 ENCOUNTER — Other Ambulatory Visit: Payer: Self-pay

## 2023-12-22 ENCOUNTER — Encounter (HOSPITAL_COMMUNITY)
Admission: RE | Admit: 2023-12-22 | Discharge: 2023-12-22 | Disposition: A | Discharge status: Home / Self Care | Source: Ambulatory Visit | Attending: Orthopaedic Surgery | Admitting: Orthopaedic Surgery

## 2023-12-22 ENCOUNTER — Encounter (HOSPITAL_COMMUNITY): Payer: Self-pay

## 2023-12-22 VITALS — BP 134/88 | HR 73 | Temp 98.6°F | Ht 70.0 in | Wt 194.0 lb

## 2023-12-22 DIAGNOSIS — D6851 Activated protein C resistance: Secondary | ICD-10-CM | POA: Insufficient documentation

## 2023-12-22 DIAGNOSIS — Z01812 Encounter for preprocedural laboratory examination: Secondary | ICD-10-CM | POA: Insufficient documentation

## 2023-12-22 DIAGNOSIS — M19012 Primary osteoarthritis, left shoulder: Secondary | ICD-10-CM | POA: Diagnosis not present

## 2023-12-22 DIAGNOSIS — Z951 Presence of aortocoronary bypass graft: Secondary | ICD-10-CM | POA: Insufficient documentation

## 2023-12-22 DIAGNOSIS — Z86718 Personal history of other venous thrombosis and embolism: Secondary | ICD-10-CM | POA: Insufficient documentation

## 2023-12-22 DIAGNOSIS — I10 Essential (primary) hypertension: Secondary | ICD-10-CM | POA: Insufficient documentation

## 2023-12-22 DIAGNOSIS — Z86711 Personal history of pulmonary embolism: Secondary | ICD-10-CM | POA: Insufficient documentation

## 2023-12-22 DIAGNOSIS — I2510 Atherosclerotic heart disease of native coronary artery without angina pectoris: Secondary | ICD-10-CM | POA: Insufficient documentation

## 2023-12-22 DIAGNOSIS — Z955 Presence of coronary angioplasty implant and graft: Secondary | ICD-10-CM | POA: Insufficient documentation

## 2023-12-22 DIAGNOSIS — Z7901 Long term (current) use of anticoagulants: Secondary | ICD-10-CM | POA: Insufficient documentation

## 2023-12-22 DIAGNOSIS — Z01818 Encounter for other preprocedural examination: Secondary | ICD-10-CM

## 2023-12-22 HISTORY — DX: Atherosclerotic heart disease of native coronary artery without angina pectoris: I25.10

## 2023-12-22 LAB — BASIC METABOLIC PANEL WITH GFR
Anion gap: 8 (ref 5–15)
BUN: 17 mg/dL (ref 8–23)
CO2: 28 mmol/L (ref 22–32)
Calcium: 9.3 mg/dL (ref 8.9–10.3)
Chloride: 106 mmol/L (ref 98–111)
Creatinine, Ser: 0.98 mg/dL (ref 0.61–1.24)
GFR, Estimated: >60 mL/min (ref >60)
Glucose, Bld: 86 mg/dL (ref 70–99)
Potassium: 4.3 mmol/L (ref 3.5–5.1)
Sodium: 143 mmol/L (ref 135–145)

## 2023-12-22 LAB — CBC
HCT: 47.6 % (ref 39.0–52.0)
Hemoglobin: 14.6 g/dL (ref 13.0–17.0)
MCH: 30.3 pg (ref 26.0–34.0)
MCHC: 30.7 g/dL (ref 30.0–36.0)
MCV: 98.8 fL (ref 80.0–100.0)
Platelets: 260 K/uL (ref 150–400)
RBC: 4.82 MIL/uL (ref 4.22–5.81)
RDW: 13.1 % (ref 11.5–15.5)
WBC: 5.7 K/uL (ref 4.0–10.5)
nRBC: 0 % (ref 0.0–0.2)

## 2023-12-22 LAB — SURGICAL PCR SCREEN
MRSA, PCR: NEGATIVE
Staphylococcus aureus: NEGATIVE

## 2023-12-22 NOTE — Progress Notes (Signed)
 For Anesthesia: PCP - Ransom Other, MD  Cardiologist - Kate Lonni CROME, MD . ARNETTA: 11/23/23 Clearance:Lawrence, Lamarr HERO, NP : 11/25/23 Bowel Prep reminder:N/A  Chest x-ray - CT cardiac: 05/05/23 EKG - 06/15/23 Stress Test -  ECHO - 05/30/23 Cardiac Cath - 06/14/23 Pacemaker/ICD device last checked: Pacemaker orders received: Device Rep notified:  Spinal Cord Stimulator:N/A  Sleep Study - N/A CPAP -   Fasting Blood Sugar - N/A Checks Blood Sugar _____ times a day Date and result of last Hgb A1c-  Last dose of GLP1 agonist- N/A GLP1 instructions: Hold 7 days prior to schedule (Hold 24 hours-daily)   Last dose of SGLT-2 inhibitors- N/A SGLT-2 instructions: Hold 72 hours prior to surgery  Blood Thinner Instructions: Xarelto  will be hold after: 12/31/23 Last Dose: Time last taken:  Aspirin  Instructions: Last Dose: Time last taken:  Activity level: Can go up a flight of stairs and activities of daily living without stopping and without chest pain and/or shortness of breath   Able to exercise without chest pain and/or shortness of breath  Anesthesia review: Hx: HTN,CAD(CABG),Factor V,DVT.  Patient denies shortness of breath, fever, cough and chest pain at PAT appointment   Patient verbalized understanding of instructions that were reviewed over the telephone.

## 2023-12-26 NOTE — Progress Notes (Signed)
 Anesthesia Chart Review   Case: [id number] Date/Time: 01/04/24 1045   Procedure: ARTHROPLASTY, SHOULDER, TOTAL, REVERSE (Left: Shoulder)   Anesthesia type: General   Diagnosis: Primary osteoarthritis of left shoulder [M19.012]   Pre-op diagnosis: osteoarthritis of left shoulder   Location: [facility] ROOM 09 / [facility]   Surgeons: Cristy Bonner DASEN, MD       DISCUSSION:73 y.o. never smoker with h/o HTN, Factor V Leiden, DVT following shoulder surgery 2014, saddle PE 2019, CAD s/p CABG, left shoulder OA scheduled for above procedure 01/04/24 with Dr. Bonner Cristy.   Pt s/p PCI 06/14/2023. Plavix  discontinued 12/14/23, remains on Xarelto . Pt last seen by cardiology 11/23/2023. Per OV note, Preop evaluation: Reports had a fall and tore rotator cuff in left shoulder, states that he was seen by orthopedist and recommended surgery.  Given stress PET findings as above, LHC was done on 05/13/2023 which showed occluded vein grafts and high-grade proximal LCx stenosis.  Underwent PCI to LCx on 06/14/2023.  Denies any anginal symptoms - As above, plan to hold Xarelto  3 days prior to surgery and continue aspirin  perioperatively  Last dose of Xarelto  12/31/2023.   Pt last seen by PCP 12/13/2023. Per notes he is medically cleared.  VS: BP 134/88   Pulse 73   Temp 37 C (Oral)   Ht 5' 10 (1.778 m)   Wt 88 kg   SpO2 98%   BMI 27.84 kg/m   PROVIDERS: Ransom Other, MD is PCP   Cardiologist - Kate Lonni CROME, MD  LABS: Labs reviewed: Acceptable for surgery. (all labs ordered are listed, but only abnormal results are displayed)  Labs Reviewed  SURGICAL PCR SCREEN  BASIC METABOLIC PANEL WITH GFR  CBC     IMAGES:   EKG:   CV: Echo 05/30/2023 1. Left ventricular ejection fraction, by estimation, is 55 to 60%. The  left ventricle has normal function. The left ventricle has no regional  wall motion abnormalities. Left ventricular diastolic parameters were  normal.   2. Right ventricular systolic  function is normal. The right ventricular  size is normal.   3. The mitral valve is normal in structure. Trivial mitral valve  regurgitation. No evidence of mitral stenosis.   4. The aortic valve is normal in structure. Aortic valve regurgitation is  not visualized. No aortic stenosis is present.   5. The inferior vena cava is normal in size with greater than 50%  respiratory variability, suggesting right atrial pressure of 3 mmHg.  Past Medical History:  Diagnosis Date   Coronary artery disease    Factor V Leiden    Hypertension     Past Surgical History:  Procedure Laterality Date   CORONARY ARTERY BYPASS GRAFT N/A 10/18/2019   Procedure: CORONARY ARTERY BYPASS GRAFTING (CABG), ON PUMP, TIMES FOUR, USING LEFT INTERAL MAMMARY ATERY AND ENDOSCOPICALLY HARVESTED RIGHT GREATER SAPHENOUS VEIN;  Surgeon: Lucas Dorise POUR, MD;  Location: [facility] OR;  Service: Open Heart Surgery;  Laterality: N/A;   CORONARY ATHERECTOMY  06/14/2023   Procedure: CORONARY ATHERECTOMY;  Surgeon: Wendel Lurena POUR, MD;  Location: [facility] INVASIVE CV LAB;  Service: Cardiovascular;;   CORONARY STENT INTERVENTION N/A 06/14/2023   Procedure: CORONARY STENT INTERVENTION;  Surgeon: Wendel Lurena POUR, MD;  Location: [facility] INVASIVE CV LAB;  Service: Cardiovascular;  Laterality: N/A;   HERNIA REPAIR     IR ANGIOGRAM PULMONARY BILATERAL SELECTIVE  03/19/2017   IR ANGIOGRAM SELECTIVE EACH ADDITIONAL VESSEL  03/19/2017   IR ANGIOGRAM SELECTIVE EACH ADDITIONAL VESSEL  03/19/2017   IR INFUSION THROMBOL ARTERIAL INITIAL (MS)  03/19/2017   IR INFUSION THROMBOL ARTERIAL INITIAL (MS)  03/19/2017   IR THROMB F/U EVAL ART/VEN FINAL DAY (MS)  03/20/2017   IR US  GUIDE VASC ACCESS RIGHT  03/19/2017   JOINT REPLACEMENT     LEFT HEART CATH AND CORONARY ANGIOGRAPHY N/A 10/16/2019   Procedure: LEFT HEART CATH AND CORONARY ANGIOGRAPHY;  Surgeon: Anner Alm ORN, MD;  Location: [facility] INVASIVE CV LAB;  Service: Cardiovascular;  Laterality: N/A;   LEFT HEART CATH AND CORONARY  ANGIOGRAPHY N/A 06/14/2023   Procedure: LEFT HEART CATH AND CORONARY ANGIOGRAPHY;  Surgeon: Wendel Lurena POUR, MD;  Location: [facility] INVASIVE CV LAB;  Service: Cardiovascular;  Laterality: N/A;   LEFT HEART CATH AND CORS/GRAFTS ANGIOGRAPHY N/A 05/13/2023   Procedure: LEFT HEART CATH AND CORS/GRAFTS ANGIOGRAPHY;  Surgeon: Wendel Lurena POUR, MD;  Location: [facility] INVASIVE CV LAB;  Service: Cardiovascular;  Laterality: N/A;   ROTATOR CUFF REPAIR     TEE WITHOUT CARDIOVERSION N/A 10/18/2019   Procedure: TRANSESOPHAGEAL ECHOCARDIOGRAM (TEE);  Surgeon: Lucas Dorise POUR, MD;  Location: [facility] OR;  Service: Open Heart Surgery;  Laterality: N/A;    MEDICATIONS:  amoxicillin  (AMOXIL ) 500 MG tablet   aspirin  EC 81 MG tablet   atorvastatin  (LIPITOR ) 80 MG tablet   EPINEPHrine  0.3 mg/0.3 mL IJ SOAJ injection   losartan  (COZAAR ) 50 MG tablet   metoprolol  succinate (TOPROL -XL) 25 MG 24 hr tablet   nitroGLYCERIN  (NITROSTAT ) 0.4 MG SL tablet   pantoprazole  (PROTONIX ) 40 MG tablet   XARELTO  20 MG TABS tablet   No current facility-administered medications for this encounter.     Harlene Hoots Ward, PA-C [facility] Pre-Surgical Testing [phone]

## 2023-12-26 NOTE — Anesthesia Preprocedure Evaluation (Addendum)
 Anesthesia Evaluation  Patient identified by MRN, date of birth, ID band Patient awake    Reviewed: Allergy & Precautions, NPO status , Patient's Chart, lab work & pertinent test results  Airway Mallampati: II  TM Distance: >3 FB Neck ROM: Full    Dental no notable dental hx.    Pulmonary PE   Pulmonary exam normal        Cardiovascular hypertension, Pt. on medications and Pt. on home beta blockers + CAD, + Cardiac Stents, + CABG (2019) and + DVT   Rhythm:Regular Rate:Normal  ECHO 03/25: CV: Echo 05/30/2023 1. Left ventricular ejection fraction, by estimation, is 55 to 60%. The  left ventricle has normal function. The left ventricle has no regional  wall motion abnormalities. Left ventricular diastolic parameters were  normal.   2. Right ventricular systolic function is normal. The right ventricular  size is normal.   3. The mitral valve is normal in structure. Trivial mitral valve  regurgitation. No evidence of mitral stenosis.   4. The aortic valve is normal in structure. Aortic valve regurgitation is  not visualized. No aortic stenosis is present.   5. The inferior vena cava is normal in size with greater than 50%  respiratory variability, suggesting right atrial pressure of 3 mmHg.     Neuro/Psych negative neurological ROS  negative psych ROS   GI/Hepatic Neg liver ROS,GERD  Medicated,,  Endo/Other  negative endocrine ROS    Renal/GU negative Renal ROS  negative genitourinary   Musculoskeletal  (+) Arthritis ,    Abdominal Normal abdominal exam  (+)   Peds  Hematology  (+) Blood dyscrasia, anemia Lab Results      Component                Value               Date                      WBC                      5.7                 12/22/2023                HGB                      14.6                12/22/2023                HCT                      47.6                12/22/2023                MCV                       98.8                12/22/2023                PLT                      260                 12/22/2023  Lab Results      Component                Value               Date                      NA                       143                 12/22/2023                K                        4.3                 12/22/2023                CO2                      28                  12/22/2023                GLUCOSE                  86                  12/22/2023                BUN                      17                  12/22/2023                CREATININE               0.98                12/22/2023                CALCIUM                   9.3                 12/22/2023                EGFR                     75                  06/08/2023                GFRNONAA                 >60                 12/22/2023              Anesthesia Other Findings Xarelto  LD 12/31/23  Reproductive/Obstetrics                              Anesthesia Physical Anesthesia Plan  ASA: 3  Anesthesia Plan: General and Regional   Post-op Pain Management: Regional block*   Induction: Intravenous  PONV Risk Score and Plan: 2 and Ondansetron , Dexamethasone and Treatment may vary due to age or medical condition  Airway Management Planned: Mask and Oral ETT  Additional Equipment: None  Intra-op Plan:   Post-operative Plan: Extubation in OR  Informed Consent: I have reviewed the patients History and Physical, chart, labs and discussed the procedure including the risks, benefits and alternatives for the proposed anesthesia with the patient or authorized representative who has indicated his/her understanding and acceptance.     Dental advisory given  Plan Discussed with: CRNA  Anesthesia Plan Comments: (See PAT note 12/22/2023)         Anesthesia Quick Evaluation

## 2023-12-28 DIAGNOSIS — B078 Other viral warts: Secondary | ICD-10-CM | POA: Diagnosis not present

## 2023-12-28 DIAGNOSIS — L718 Other rosacea: Secondary | ICD-10-CM | POA: Diagnosis not present

## 2023-12-28 DIAGNOSIS — Z7189 Other specified counseling: Secondary | ICD-10-CM | POA: Diagnosis not present

## 2023-12-28 DIAGNOSIS — Z789 Other specified health status: Secondary | ICD-10-CM | POA: Diagnosis not present

## 2023-12-28 DIAGNOSIS — L821 Other seborrheic keratosis: Secondary | ICD-10-CM | POA: Diagnosis not present

## 2023-12-28 DIAGNOSIS — R238 Other skin changes: Secondary | ICD-10-CM | POA: Diagnosis not present

## 2023-12-28 DIAGNOSIS — L570 Actinic keratosis: Secondary | ICD-10-CM | POA: Diagnosis not present

## 2023-12-28 DIAGNOSIS — L910 Hypertrophic scar: Secondary | ICD-10-CM | POA: Diagnosis not present

## 2023-12-28 DIAGNOSIS — D1801 Hemangioma of skin and subcutaneous tissue: Secondary | ICD-10-CM | POA: Diagnosis not present

## 2023-12-28 DIAGNOSIS — X32XXXA Exposure to sunlight, initial encounter: Secondary | ICD-10-CM | POA: Diagnosis not present

## 2023-12-28 DIAGNOSIS — L814 Other melanin hyperpigmentation: Secondary | ICD-10-CM | POA: Diagnosis not present

## 2023-12-30 DIAGNOSIS — J029 Acute pharyngitis, unspecified: Secondary | ICD-10-CM | POA: Diagnosis not present

## 2024-01-02 ENCOUNTER — Ambulatory Visit
Admission: RE | Admit: 2024-01-02 | Discharge: 2024-01-02 | Disposition: A | Source: Ambulatory Visit | Attending: Orthopaedic Surgery

## 2024-01-02 ENCOUNTER — Other Ambulatory Visit

## 2024-01-02 DIAGNOSIS — G8929 Other chronic pain: Secondary | ICD-10-CM

## 2024-01-02 DIAGNOSIS — M19012 Primary osteoarthritis, left shoulder: Secondary | ICD-10-CM | POA: Diagnosis not present

## 2024-01-03 NOTE — H&P (Signed)
 PREOPERATIVE H&P  Chief Complaint: osteoarthritis of left shoulder  HPI: Jose Bowers is a 73 y.o. male who is scheduled for Procedure(s): ARTHROPLASTY, SHOULDER, TOTAL, REVERSE.   Patient needs a second opinion about his left shoulder.  He has an atypical heart history with a history of Factor V Leiden, pulmonary embolism and open heart surgery.  He has some significant blockages in his heart that he reports his cardiologist said are not currently issues.  He has been considering doing his shoulder first because he is worried he is going to miss an opportunity with his shoulder.  He lives in [place], [place]  and wants to consider his physical therapy there.    Symptoms are rated as moderate to severe, and have been worsening.  This is significantly impairing activities of daily living.    Please see clinic note for further details on this patient's care.    He has elected for surgical management.   Past Medical History:  Diagnosis Date   Coronary artery disease    Factor V Leiden    Hypertension    Past Surgical History:  Procedure Laterality Date   CORONARY ARTERY BYPASS GRAFT N/A 10/18/2019   Procedure: CORONARY ARTERY BYPASS GRAFTING (CABG), ON PUMP, TIMES FOUR, USING LEFT INTERAL MAMMARY ATERY AND ENDOSCOPICALLY HARVESTED RIGHT GREATER SAPHENOUS VEIN;  Surgeon: Lucas Dorise POUR, MD;  Location: [facility] OR;  Service: Open Heart Surgery;  Laterality: N/A;   CORONARY ATHERECTOMY  06/14/2023   Procedure: CORONARY ATHERECTOMY;  Surgeon: Wendel Lurena POUR, MD;  Location: [facility];  Service: Cardiovascular;;   CORONARY STENT INTERVENTION N/A 06/14/2023   Procedure: CORONARY STENT INTERVENTION;  Surgeon: Wendel Lurena POUR, MD;  Location: [facility];  Service: Cardiovascular;  Laterality: N/A;   HERNIA REPAIR     IR ANGIOGRAM PULMONARY BILATERAL SELECTIVE  03/19/2017   IR ANGIOGRAM SELECTIVE EACH ADDITIONAL VESSEL  03/19/2017   IR ANGIOGRAM SELECTIVE EACH ADDITIONAL  VESSEL  03/19/2017   IR INFUSION THROMBOL ARTERIAL INITIAL (MS)  03/19/2017   IR INFUSION THROMBOL ARTERIAL INITIAL (MS)  03/19/2017   IR THROMB F/U EVAL ART/VEN FINAL DAY (MS)  03/20/2017   IR US  GUIDE VASC ACCESS RIGHT  03/19/2017   JOINT REPLACEMENT     LEFT HEART CATH AND CORONARY ANGIOGRAPHY N/A 10/16/2019   Procedure: LEFT HEART CATH AND CORONARY ANGIOGRAPHY;  Surgeon: Anner Alm ORN, MD;  Location: [facility];  Service: Cardiovascular;  Laterality: N/A;   LEFT HEART CATH AND CORONARY ANGIOGRAPHY N/A 06/14/2023   Procedure: LEFT HEART CATH AND CORONARY ANGIOGRAPHY;  Surgeon: Wendel Lurena POUR, MD;  Location: [facility];  Service: Cardiovascular;  Laterality: N/A;   LEFT HEART CATH AND CORS/GRAFTS ANGIOGRAPHY N/A 05/13/2023   Procedure: LEFT HEART CATH AND CORS/GRAFTS ANGIOGRAPHY;  Surgeon: Wendel Lurena POUR, MD;  Location: [facility];  Service: Cardiovascular;  Laterality: N/A;   ROTATOR CUFF REPAIR     TEE WITHOUT CARDIOVERSION N/A 10/18/2019   Procedure: TRANSESOPHAGEAL ECHOCARDIOGRAM (TEE);  Surgeon: Lucas Dorise POUR, MD;  Location[facility];  Service: Open Heart Surgery;  Laterality: N/A;   Social History   Socioeconomic History   Marital status: Married    Spouse name: Not on file   Number of children: 1   Years of education: Not on file   Highest education level: Not on file  Occupational History   Occupation: retired     Comment: Dentist   Tobacco Use  Smoking status: Never   Smokeless tobacco: Never  Vaping Use   Vaping status: Never Used  Substance and Sexual Activity   Alcohol use: Yes    Comment: few times a week   Drug use: No   Sexual activity: Not on file  Other Topics Concern   Not on file  Social History Narrative   Not on file   Social Drivers of Health   Financial Resource Strain: Not on file  Food Insecurity: Not on file  Transportation Needs: Not on file  Physical Activity: Not on file  Stress: Not on file  Social Connections:  Not on file   Family History  Problem Relation Age of Onset   Clotting disorder Father    Heart disease Brother    Clotting disorder Brother    Hypertension Brother    Heart disease Brother    Clotting disorder Son    Clotting disorder Grandchild    No Known Allergies Prior to Admission medications   Medication Sig Start Date End Date Taking? Authorizing Provider  amoxicillin  (AMOXIL ) 500 MG tablet Take 2,000 mg by mouth once.   Yes [provider]  aspirin  EC 81 MG tablet Take 1 tablet (81 mg total) by mouth daily. Swallow whole. 06/14/23 06/13/24 Yes Duke, Jon Garre, PA  atorvastatin  (LIPITOR ) 80 MG tablet TAKE 1 TABLET BY MOUTH DAILY AT 6 PM. 01/09/21  Yes Kate Lonni CROME, MD  EPINEPHrine  0.3 mg/0.3 mL IJ SOAJ injection Inject 0.3 mg into the muscle as needed for anaphylaxis.   Yes [provider]  losartan  (COZAAR ) 50 MG tablet Take 50 mg by mouth daily.   Yes [provider]  metoprolol  succinate (TOPROL -XL) 25 MG 24 hr tablet Take 1 tablet (25 mg total) by mouth daily. Take with or immediately following a meal. 11/23/23 02/21/24 Yes Kate Lonni CROME, MD  nitroGLYCERIN  (NITROSTAT ) 0.4 MG SL tablet Place 1 tablet (0.4 mg total) under the tongue every 5 (five) minutes as needed for chest pain. 05/10/23 12/21/23 Yes Kate Lonni CROME, MD  XARELTO  20 MG TABS tablet Take 20 mg by mouth daily. 08/23/19  Yes [provider]  pantoprazole  (PROTONIX ) 40 MG tablet Take 1 tablet (40 mg total) by mouth daily. Patient not taking: Reported on 12/21/2023 06/03/23   Kate Lonni CROME, MD    ROS: All other systems have been reviewed and were otherwise negative with the exception of those mentioned in the HPI and as above.  Physical Exam: General: Alert, no acute distress Cardiovascular: No pedal edema Respiratory: No cyanosis, no use of accessory musculature GI: No organomegaly, abdomen is soft and non-tender Skin: No lesions in the area  of chief complaint Neurologic: Sensation intact distally Psychiatric: Patient is competent for consent with normal mood and affect Lymphatic: No axillary or cervical lymphadenopathy  MUSCULOSKELETAL:  Range of motion of the shoulder is to about 90 degrees, passive to 120.  He has gross weakness with supraspinatus and infraspinatus testing.    Imaging: MRI reviewed demonstrates an atypical cuff tear to the glenoid with intrasubstance degeneration and delamination.    BMI: Estimated body mass index is 27.84 kg/m as calculated from the following:   Height as of 12/22/23: 5' 10 (1.778 m).   Weight as of 12/22/23: 88 kg.  Lab Results  Component Value Date   ALBUMIN  3.0 (L) 03/22/2017   Diabetes: Patient does not have a diagnosis of diabetes.     Smoking Status:   reports that he has never smoked. He  has never used smokeless tobacco.     Assessment: osteoarthritis of left shoulder  Plan: Plan for Procedure(s): ARTHROPLASTY, SHOULDER, TOTAL, REVERSE  The risks benefits and alternatives were discussed with the patient including but not limited to the risks of nonoperative treatment, versus surgical intervention including infection, bleeding, nerve injury,  blood clots, cardiopulmonary complications, morbidity, mortality, among others, and they were willing to proceed.   We additionally specifically discussed risks of axillary nerve injury, infection, periprosthetic fracture, continued pain and longevity of implants prior to beginning procedure.    Patient will be closely monitored in PACU for medical stabilization and pain control. If found stable in PACU, patient may be discharged home with outpatient follow-up. If any concerns regarding patient's stabilization patient will be admitted for observation after surgery. The patient is planning to be discharged home with outpatient PT.   The patient acknowledged the explanation, agreed to proceed with the plan and consent was signed.    Operative Plan: Left reverse total shoulder arthroplasty Discharge Medications: standard DVT Prophylaxis: resume xarelto  Physical Therapy: outpatient PT Special Discharge needs: Sling (should bring with him). IceMan   Aleck LOISE Stalling, PA-C  01/03/2024 4:06 PM

## 2024-01-04 ENCOUNTER — Encounter (HOSPITAL_COMMUNITY)
Admission: RE | Disposition: A | Discharge status: Home / Self Care | Payer: Self-pay | Source: Home / Self Care | Attending: Orthopaedic Surgery

## 2024-01-04 ENCOUNTER — Ambulatory Visit (HOSPITAL_COMMUNITY)
Admission: RE | Admit: 2024-01-04 | Discharge: 2024-01-04 | Disposition: A | Discharge status: Home / Self Care | Attending: Orthopaedic Surgery | Admitting: Orthopaedic Surgery

## 2024-01-04 ENCOUNTER — Ambulatory Visit (HOSPITAL_COMMUNITY): Payer: Self-pay | Admitting: Physician Assistant

## 2024-01-04 ENCOUNTER — Ambulatory Visit (HOSPITAL_COMMUNITY): Admitting: Certified Registered"

## 2024-01-04 ENCOUNTER — Encounter (HOSPITAL_COMMUNITY): Payer: Self-pay | Admitting: Orthopaedic Surgery

## 2024-01-04 ENCOUNTER — Ambulatory Visit (HOSPITAL_COMMUNITY)

## 2024-01-04 DIAGNOSIS — M19012 Primary osteoarthritis, left shoulder: Secondary | ICD-10-CM

## 2024-01-04 DIAGNOSIS — J9601 Acute respiratory failure with hypoxia: Secondary | ICD-10-CM

## 2024-01-04 DIAGNOSIS — Z86711 Personal history of pulmonary embolism: Secondary | ICD-10-CM | POA: Diagnosis not present

## 2024-01-04 DIAGNOSIS — I10 Essential (primary) hypertension: Secondary | ICD-10-CM

## 2024-01-04 DIAGNOSIS — Z7901 Long term (current) use of anticoagulants: Secondary | ICD-10-CM | POA: Insufficient documentation

## 2024-01-04 DIAGNOSIS — D6851 Activated protein C resistance: Secondary | ICD-10-CM | POA: Diagnosis not present

## 2024-01-04 DIAGNOSIS — Z471 Aftercare following joint replacement surgery: Secondary | ICD-10-CM | POA: Diagnosis not present

## 2024-01-04 DIAGNOSIS — I2510 Atherosclerotic heart disease of native coronary artery without angina pectoris: Secondary | ICD-10-CM | POA: Diagnosis not present

## 2024-01-04 DIAGNOSIS — Z96612 Presence of left artificial shoulder joint: Secondary | ICD-10-CM | POA: Diagnosis not present

## 2024-01-04 DIAGNOSIS — G8918 Other acute postprocedural pain: Secondary | ICD-10-CM | POA: Diagnosis not present

## 2024-01-04 HISTORY — PX: REVERSE SHOULDER ARTHROPLASTY: SHX5054

## 2024-01-04 SURGERY — ARTHROPLASTY, SHOULDER, TOTAL, REVERSE
Anesthesia: Regional | Site: Shoulder | Laterality: Left

## 2024-01-04 MED ORDER — ROCURONIUM 10MG/ML (10ML) SYRINGE FOR MEDFUSION PUMP - OPTIME
INTRAVENOUS | Status: DC | PRN
Start: 2024-01-04 — End: 2024-01-04
  Administered 2024-01-04: 30 mg via INTRAVENOUS
  Administered 2024-01-04: 70 mg via INTRAVENOUS

## 2024-01-04 MED ORDER — FENTANYL CITRATE (PF) 50 MCG/ML IJ SOSY: 25.0000 ug | PREFILLED_SYRINGE | INTRAMUSCULAR | Status: DC | PRN

## 2024-01-04 MED ORDER — ONDANSETRON HCL 4 MG/2ML IJ SOLN
INTRAMUSCULAR | Status: AC
  Filled 2024-01-04: qty 2

## 2024-01-04 MED ORDER — LIDOCAINE HCL (PF) 2 % IJ SOLN
INTRAMUSCULAR | Status: AC
  Filled 2024-01-04: qty 5

## 2024-01-04 MED ORDER — VANCOMYCIN HCL 1000 MG IV SOLR
INTRAVENOUS | Status: AC
  Filled 2024-01-04: qty 20

## 2024-01-04 MED ORDER — ACETAMINOPHEN 500 MG PO TABS: 1000.0000 mg | ORAL_TABLET | Freq: Three times a day (TID) | ORAL | 0 refills | Status: AC

## 2024-01-04 MED ORDER — ACETAMINOPHEN 10 MG/ML IV SOLN
INTRAVENOUS | Status: AC
  Filled 2024-01-04: qty 100

## 2024-01-04 MED ORDER — OXYCODONE HCL 5 MG PO TABS
ORAL_TABLET | ORAL | Status: AC
  Filled 2024-01-04: qty 1

## 2024-01-04 MED ORDER — 0.9 % SODIUM CHLORIDE (POUR BTL) OPTIME
TOPICAL | Status: DC | PRN
  Administered 2024-01-04: 1000 mL

## 2024-01-04 MED ORDER — PROPOFOL 10 MG/ML IV BOLUS
INTRAVENOUS | Status: DC | PRN
  Administered 2024-01-04: 170 mg via INTRAVENOUS

## 2024-01-04 MED ORDER — ACETAMINOPHEN 10 MG/ML IV SOLN: 1000.0000 mg | Freq: Once | INTRAVENOUS | Status: DC | PRN

## 2024-01-04 MED ORDER — PROPOFOL 10 MG/ML IV BOLUS
INTRAVENOUS | Status: AC
  Filled 2024-01-04: qty 20

## 2024-01-04 MED ORDER — ORAL CARE MOUTH RINSE: 15.0000 mL | Freq: Once | OROMUCOSAL | Status: AC

## 2024-01-04 MED ORDER — ACETAMINOPHEN 500 MG PO TABS
1000.0000 mg | ORAL_TABLET | Freq: Once | ORAL | Status: AC
  Administered 2024-01-04: 1000 mg via ORAL
  Filled 2024-01-04: qty 2

## 2024-01-04 MED ORDER — CHLORHEXIDINE GLUCONATE 0.12 % MT SOLN
15.0000 mL | Freq: Once | OROMUCOSAL | Status: AC
  Administered 2024-01-04: 15 mL via OROMUCOSAL

## 2024-01-04 MED ORDER — BUPIVACAINE LIPOSOME 1.3 % IJ SUSP
INTRAMUSCULAR | Status: DC | PRN
  Administered 2024-01-04: 10 mL via PERINEURAL

## 2024-01-04 MED ORDER — FENTANYL CITRATE (PF) 50 MCG/ML IJ SOSY
PREFILLED_SYRINGE | INTRAMUSCULAR | Status: AC
  Administered 2024-01-04: 50 ug via INTRAVENOUS
  Filled 2024-01-04: qty 1

## 2024-01-04 MED ORDER — OXYCODONE HCL 5 MG PO TABS
5.0000 mg | ORAL_TABLET | Freq: Once | ORAL | Status: AC
  Administered 2024-01-04: 5 mg via ORAL

## 2024-01-04 MED ORDER — SUGAMMADEX SODIUM 200 MG/2ML IV SOLN
INTRAVENOUS | Status: DC | PRN
  Administered 2024-01-04: 200 mg via INTRAVENOUS

## 2024-01-04 MED ORDER — STERILE WATER FOR IRRIGATION IR SOLN
Status: DC | PRN
  Administered 2024-01-04: 2000 mL

## 2024-01-04 MED ORDER — ONDANSETRON HCL 4 MG/2ML IJ SOLN
INTRAMUSCULAR | Status: DC | PRN
  Administered 2024-01-04: 4 mg via INTRAVENOUS

## 2024-01-04 MED ORDER — SUGAMMADEX SODIUM 200 MG/2ML IV SOLN
INTRAVENOUS | Status: AC
  Filled 2024-01-04: qty 2

## 2024-01-04 MED ORDER — LACTATED RINGERS IV SOLN: INTRAVENOUS | Status: DC

## 2024-01-04 MED ORDER — CEFAZOLIN SODIUM-DEXTROSE 2-4 GM/100ML-% IV SOLN
2.0000 g | INTRAVENOUS | Status: AC
  Administered 2024-01-04: 2 g via INTRAVENOUS
  Filled 2024-01-04: qty 100

## 2024-01-04 MED ORDER — DEXAMETHASONE SOD PHOSPHATE PF 10 MG/ML IJ SOLN
INTRAMUSCULAR | Status: DC | PRN
  Administered 2024-01-04: 10 mg via INTRAVENOUS

## 2024-01-04 MED ORDER — MIDAZOLAM HCL (PF) 2 MG/2ML IJ SOLN
1.0000 mg | Freq: Once | INTRAMUSCULAR | Status: AC
Start: 2024-01-04 — End: 2024-01-04

## 2024-01-04 MED ORDER — PHENYLEPHRINE HCL-NACL 20-0.9 MG/250ML-% IV SOLN
INTRAVENOUS | Status: DC | PRN
  Administered 2024-01-04: 50 ug/min via INTRAVENOUS

## 2024-01-04 MED ORDER — ROCURONIUM BROMIDE 10 MG/ML (PF) SYRINGE
PREFILLED_SYRINGE | INTRAVENOUS | Status: AC
  Filled 2024-01-04: qty 10

## 2024-01-04 MED ORDER — SODIUM CHLORIDE 0.9 % IR SOLN
Status: DC | PRN
  Administered 2024-01-04: 1000 mL

## 2024-01-04 MED ORDER — MELOXICAM 15 MG PO TABS: 15.0000 mg | ORAL_TABLET | Freq: Every day | ORAL | 0 refills | Status: AC

## 2024-01-04 MED ORDER — FENTANYL CITRATE (PF) 50 MCG/ML IJ SOSY
50.0000 ug | PREFILLED_SYRINGE | Freq: Once | INTRAMUSCULAR | Status: AC
Start: 2024-01-04 — End: 2024-01-04

## 2024-01-04 MED ORDER — MIDAZOLAM HCL 2 MG/2ML IJ SOLN
INTRAMUSCULAR | Status: AC
  Administered 2024-01-04: 2 mg via INTRAVENOUS
  Filled 2024-01-04: qty 2

## 2024-01-04 MED ORDER — LIDOCAINE 2% (20 MG/ML) 5 ML SYRINGE
INTRAMUSCULAR | Status: DC | PRN
  Administered 2024-01-04: 60 mg via INTRAVENOUS

## 2024-01-04 MED ORDER — ONDANSETRON HCL 4 MG PO TABS: 4.0000 mg | ORAL_TABLET | Freq: Three times a day (TID) | ORAL | 0 refills | Status: AC | PRN

## 2024-01-04 MED ORDER — PHENYLEPHRINE HCL (PRESSORS) 10 MG/ML IV SOLN
INTRAVENOUS | Status: DC | PRN
  Administered 2024-01-04 (×2): 160 ug via INTRAVENOUS

## 2024-01-04 MED ORDER — FENTANYL CITRATE (PF) 100 MCG/2ML IJ SOLN
INTRAMUSCULAR | Status: AC
  Filled 2024-01-04: qty 2

## 2024-01-04 MED ORDER — FENTANYL CITRATE (PF) 250 MCG/5ML IJ SOLN
INTRAMUSCULAR | Status: DC | PRN
  Administered 2024-01-04: 100 ug via INTRAVENOUS

## 2024-01-04 MED ORDER — BUPIVACAINE HCL (PF) 0.5 % IJ SOLN
INTRAMUSCULAR | Status: DC | PRN
  Administered 2024-01-04: 10 mL via PERINEURAL

## 2024-01-04 MED ORDER — GABAPENTIN 100 MG PO CAPS: 100.0000 mg | ORAL_CAPSULE | Freq: Three times a day (TID) | ORAL | 0 refills | Status: AC

## 2024-01-04 SURGICAL SUPPLY — 55 items
BAG COUNTER SPONGE SURGICOUNT (BAG) ×1 IMPLANT
BASEPLATE SHOULDER FW 15D 29 (Joint) IMPLANT
BIT DRILL 3.2 PERIPHERAL SCREW (BIT) IMPLANT
BLADE SAW SGTL 73X25 THK (BLADE) ×1 IMPLANT
CHLORAPREP W/TINT 26 (MISCELLANEOUS) ×2 IMPLANT
CLSR STERI-STRIP ANTIMIC 1/2X4 (GAUZE/BANDAGES/DRESSINGS) ×1 IMPLANT
COOLER ICEMAN CLASSIC (MISCELLANEOUS) ×1 IMPLANT
COVER BACK TABLE 60X90IN (DRAPES) ×1 IMPLANT
COVER SURGICAL LIGHT HANDLE (MISCELLANEOUS) ×1 IMPLANT
CUP HUM REV SHLD 3/4 39 +3 (Cup) IMPLANT
DRAPE 3/4 80X56 (DRAPES) ×1 IMPLANT
DRAPE C-ARM 42X120 X-RAY (DRAPES) IMPLANT
DRAPE INCISE IOBAN 66X45 STRL (DRAPES) ×1 IMPLANT
DRAPE POUCH INSTRU U-SHP 10X18 (DRAPES) ×1 IMPLANT
DRAPE SHEET LG 3/4 BI-LAMINATE (DRAPES) ×2 IMPLANT
DRAPE SURG ORHT 6 SPLT 77X108 (DRAPES) ×2 IMPLANT
DRAPE TOP 10253 STERILE (DRAPES) ×1 IMPLANT
DRSG AQUACEL AG ADV 3.5X 6 (GAUZE/BANDAGES/DRESSINGS) ×1 IMPLANT
ELECT BLADE TIP CTD 4 INCH (ELECTRODE) ×1 IMPLANT
ELECT PENCIL ROCKER SW 15FT (MISCELLANEOUS) ×1 IMPLANT
ELECT REM PT RETURN 15FT ADLT (MISCELLANEOUS) ×1 IMPLANT
FACESHIELD WRAPAROUND OR TEAM (MASK) ×3 IMPLANT
GLENOSPHERE STD 39 (Joint) IMPLANT
GLOVE BIO SURGEON STRL SZ 6.5 (GLOVE) ×2 IMPLANT
GLOVE BIOGEL PI IND STRL 6.5 (GLOVE) ×1 IMPLANT
GLOVE BIOGEL PI IND STRL 8 (GLOVE) ×1 IMPLANT
GLOVE ECLIPSE 8.0 STRL XLNG CF (GLOVE) ×2 IMPLANT
GOWN STRL REUS W/ TWL LRG LVL3 (GOWN DISPOSABLE) ×2 IMPLANT
GUIDEWIRE GLENOID 2.5X220 (WIRE) IMPLANT
KIT BASIN OR (CUSTOM PROCEDURE TRAY) ×1 IMPLANT
KIT STABILIZATION SHOULDER (MISCELLANEOUS) ×1 IMPLANT
KIT TURNOVER KIT A (KITS) ×1 IMPLANT
MANIFOLD NEPTUNE II (INSTRUMENTS) ×1 IMPLANT
NS IRRIG 1000ML POUR BTL (IV SOLUTION) ×1 IMPLANT
PACK SHOULDER (CUSTOM PROCEDURE TRAY) ×1 IMPLANT
PAD COLD SHLDR WRAP-ON (PAD) ×1 IMPLANT
PIN GUIDE 3X75 SHOULDER (PIN) IMPLANT
RESTRAINT HEAD UNIVERSAL NS (MISCELLANEOUS) ×1 IMPLANT
SCREW 5.5X22 (Screw) IMPLANT
SCREW 5.5X26 (Screw) IMPLANT
SCREW BONE 6.5X40 SM (Screw) IMPLANT
SCREW PERIPHERAL 30 (Screw) IMPLANT
SET HNDPC FAN SPRY TIP SCT (DISPOSABLE) ×1 IMPLANT
SPONGE T-LAP 4X18 ~~LOC~~+RFID (SPONGE) IMPLANT
STEM HUM PERFORM 3+ (Stem) IMPLANT
SUCTION TUBE FRAZIER 12FR DISP (SUCTIONS) IMPLANT
SUT ETHIBOND 2 V 37 (SUTURE) ×1 IMPLANT
SUT ETHIBOND NAB CT1 #1 30IN (SUTURE) ×1 IMPLANT
SUT MNCRL AB 4-0 PS2 18 (SUTURE) ×1 IMPLANT
SUT VIC AB 0 CT1 36 (SUTURE) IMPLANT
SUT VIC AB 3-0 SH 27X BRD (SUTURE) ×1 IMPLANT
SUTURE FIBERWR #5 38 CONV NDL (SUTURE) ×2 IMPLANT
TOWEL OR 17X26 10 PK STRL BLUE (TOWEL DISPOSABLE) ×1 IMPLANT
TUBE SUCTION HIGH CAP CLEAR NV (SUCTIONS) ×1 IMPLANT
WATER STERILE IRR 1000ML POUR (IV SOLUTION) ×2 IMPLANT

## 2024-01-04 NOTE — Interval H&P Note (Signed)
 All questions answered, patient wants to proceed with procedure. ? ?

## 2024-01-04 NOTE — Discharge Instructions (Signed)
 Bonner Hair MD, MPH Aleck Stalling, PA-C [facility] [address] [phone] (tel)   [phone] (fax)   POST-OPERATIVE INSTRUCTIONS - TOTAL SHOULDER REPLACEMENT    WOUND CARE You may leave the operative dressing in place until your follow-up appointment. KEEP THE INCISIONS CLEAN AND DRY. There may be a small amount of fluid/bleeding leaking at the surgical site. This is normal after surgery.  If it fills with liquid or blood please call us  immediately to change it for you. Use the provided ice machine or Ice packs as often as possible for the first 3-4 days, then as needed for pain relief.   Keep a layer of cloth or a shirt between your skin and the cooling unit to prevent frost bite as it can get very cold.  SHOWERING: - You may shower on Post-Op Day #2.  - The dressing is water resistant but do not scrub it as it may start to peel up.   - You may remove the sling for showering - Gently pat the area dry.  - Do not soak the shoulder in water.  - Do not go swimming in the pool or ocean until your incision has completely healed (about 4-6 weeks after surgery) - KEEP THE INCISIONS CLEAN AND DRY.  EXERCISES Wear the sling at all times  You may remove the sling for showering, but keep the arm across the chest or in a secondary sling.    Accidental/Purposeful External Rotation and shoulder flexion (reaching behind you) is to be avoided at all costs for the first month. It is ok to come out of your sling if your are sitting and have assistance for eating.   Do not lift anything heavier than 1 pound until we discuss it further in clinic.  It is normal for your fingers/hand to become more swollen after surgery and discolored from bruising.   This will resolve over the first few weeks usually after surgery. Please continue to ambulate and do not stay sitting or lying for too long.  Perform foot and wrist pumps to assist in circulation.  PHYSICAL  THERAPY - You will begin physical therapy soon after surgery (unless otherwise specified) - Please call to set up an appointment, if you do not already have one  - Let our office if there are any issues with scheduling your therapy   - A PT referral was sent to resolve [place]  REGIONAL ANESTHESIA (NERVE BLOCKS) The anesthesia team may have performed a nerve block for you this is a great tool used to minimize pain.   The block may start wearing off overnight (between 8-24 hours postop) When the block wears off, your pain may go from nearly zero to the pain you would have had postop without the block. This is an abrupt transition but nothing dangerous is happening.   This can be a challenging period but utilize your as needed pain medications to try and manage this period. We suggest you use the pain medication the first night prior to going to bed, to ease this transition.  You may take an extra dose of narcotic when this happens if needed   POST-OP MEDICATIONS- Multimodal approach to pain control In general your pain will be controlled with a combination of substances.  Prescriptions unless otherwise discussed are electronically sent to your pharmacy.  This is a carefully made plan we use to minimize narcotic use.     Meloxicam - Anti-inflammatory medication taken on a scheduled  basis Gabapentin - this is to help with nerve based pain, take on a scheduled basis Acetaminophen  - Non-narcotic pain medicine taken on a scheduled basis  Oxycodone  - This is a strong narcotic, to be used only on an "as needed" basis for SEVERE pain. Zofran  -  take as needed for nausea   FOLLOW-UP If you develop a Fever (>101.5), Redness or Drainage from the surgical incision site, please call our office to arrange for an evaluation. Please call the office to schedule a follow-up appointment for a wound check, 7-10 days post-operatively.  IF YOU HAVE ANY QUESTIONS, PLEASE FEEL FREE TO CALL OUR  OFFICE.  HELPFUL INFORMATION  Your arm will be in a sling following surgery. You will be in this sling for the next 4 weeks.   You may be more comfortable sleeping in a semi-seated position the first few nights following surgery.  Keep a pillow propped under the elbow and forearm for comfort.  If you have a recliner type of chair it might be beneficial.  If not that is fine too, but it would be helpful to sleep propped up with pillows behind your operated shoulder as well under your elbow and forearm.  This will reduce pulling on the suture lines.  When dressing, put your operative arm in the sleeve first.  When getting undressed, take your operative arm out last.  Loose fitting, button-down shirts are recommended.  In most states it is against the law to drive while your arm is in a sling. And certainly against the law to drive while taking narcotics.  You may return to work/school in the next couple of days when you feel up to it. Desk work and typing in the sling is fine.  We suggest you use the pain medication the first night prior to going to bed, in order to ease any pain when the anesthesia wears off. You should avoid taking pain medications on an empty stomach as it will make you nauseous.  You should wean off your narcotic medicines as soon as you are able.     Most patients will be off narcotics before their first postop appointment.   Do not drink alcoholic beverages or take illicit drugs when taking pain medications.  Pain medication may make you constipated.  Below are a few solutions to try in this order: Decrease the amount of pain medication if you aren't having pain. Drink lots of decaffeinated fluids. Drink prune juice and/or each dried prunes  If the first 3 don't work start with additional solutions Take Colace - an over-the-counter stool softener Take Senokot - an over-the-counter laxative Take Miralax  - a stronger over-the-counter laxative   Dental  Antibiotics:  We require dental prophylaxis for 2 years after a shoulder replacement  Contact your surgeon for an antibiotic prescription, prior to your dental procedure.   For more information including helpful videos and documents visit our website:   [email or web address]

## 2024-01-04 NOTE — Progress Notes (Signed)
 Prescription for oxycodone  was not noted on list of meds to pick up post discharge home. Follow up with Kirstin Shepperson and Caroline McBane by RN. Oxycodone  was sent to [facility] in [place] via the [employer]. This was verified with pharmacy and with the patient's wife. Also verified patient is to resume xarelto  in 24 hours, which was communicated to the patient's wife.

## 2024-01-04 NOTE — Anesthesia Procedure Notes (Signed)
 Anesthesia Regional Block: Interscalene brachial plexus block   Pre-Anesthetic Checklist: , timeout performed,  Correct Patient, Correct Site, Correct Laterality,  Correct Procedure, Correct Position, site marked,  Risks and benefits discussed,  Surgical consent,  Pre-op evaluation,  At surgeon's request and post-op pain management  Laterality: Left  Prep: Dura Prep       Needles:  Injection technique: Single-shot  Needle Type: Echogenic Stimulator Needle     Needle Length: 5cm  Needle Gauge: 20     Additional Needles:   Procedures:,,,, ultrasound used (permanent image in chart),,    Narrative:  Start time: 01/04/2024 9:45 AM End time: 01/04/2024 9:47 AM Injection made incrementally with aspirations every 5 mL.  Performed by: Personally  Anesthesiologist: Dorethea Cordella SQUIBB, DO  Additional Notes: Patient identified. Risks/Benefits/Options discussed with patient including but not limited to bleeding, infection, nerve damage, failed block, incomplete pain control. Patient expressed understanding and wished to proceed. All questions were answered. Sterile technique was used throughout the entire procedure. Please see nursing notes for vital signs. Aspirated in 5cc intervals with injection for negative confirmation. Patient was given instructions on fall risk and not to get out of bed. All questions and concerns addressed with instructions to call with any issues or inadequate analgesia.

## 2024-01-04 NOTE — Anesthesia Postprocedure Evaluation (Signed)
 Anesthesia Post Note  Patient: Jose Bowers  Procedure(s) Performed: ARTHROPLASTY, SHOULDER, TOTAL, REVERSE (Left: Shoulder)     Patient location during evaluation: PACU Anesthesia Type: Regional and General Level of consciousness: awake and alert Pain management: pain level controlled Vital Signs Assessment: post-procedure vital signs reviewed and stable Respiratory status: spontaneous breathing, nonlabored ventilation, respiratory function stable and patient connected to nasal cannula oxygen Cardiovascular status: blood pressure returned to baseline and stable Postop Assessment: no apparent nausea or vomiting Anesthetic complications: no   No notable events documented.  Last Vitals:  Vitals:   01/04/24 1330 01/04/24 1345  BP:  131/79  Pulse:  72  Resp: (P) 15 15  Temp: (P) 36.8 C 36.7 C  SpO2: (P) 93% 93%    Last Pain:  Vitals:   01/04/24 1345  TempSrc:   PainSc: 2                  Jose Bowers

## 2024-01-04 NOTE — Transfer of Care (Signed)
 Immediate Anesthesia Transfer of Care Note  Patient: Jose Bowers  Procedure(s) Performed: ARTHROPLASTY, SHOULDER, TOTAL, REVERSE (Left: Shoulder)  Patient Location: PACU  Anesthesia Type:GA combined with regional for post-op pain  Level of Consciousness: drowsy and patient cooperative  Airway & Oxygen Therapy: Patient Spontanous Breathing  Post-op Assessment: Report given to RN and Post -op Vital signs reviewed and stable  Post vital signs: Reviewed and stable  Last Vitals:  Vitals Value Taken Time  BP 162/82 01/04/24 11:34  Temp    Pulse 70 01/04/24 11:35  Resp 19 01/04/24 11:35  SpO2 97 % 01/04/24 11:35  Vitals shown include unfiled device data.  Last Pain:  Vitals:   01/04/24 0830  TempSrc: Oral         Complications: No notable events documented.

## 2024-01-04 NOTE — Anesthesia Procedure Notes (Signed)
 Procedure Name: Intubation Date/Time: 01/04/2024 11:15 AM  Performed by: Nanci Riis, CRNAPre-anesthesia Checklist: Patient identified, Emergency Drugs available, Suction available and Patient being monitored Patient Re-evaluated:Patient Re-evaluated prior to induction Oxygen Delivery Method: Circle system utilized Preoxygenation: Pre-oxygenation with 100% oxygen Induction Type: IV induction Ventilation: Mask ventilation without difficulty Laryngoscope Size: Miller and 3 Grade View: Grade I Tube type: Oral Tube size: 7.5 mm Number of attempts: 1 Airway Equipment and Method: Stylet Placement Confirmation: ETT inserted through vocal cords under direct vision, positive ETCO2 and breath sounds checked- equal and bilateral Secured at: 22 cm Tube secured with: Tape Dental Injury: Teeth and Oropharynx as per pre-operative assessment

## 2024-01-04 NOTE — Op Note (Signed)
 Orthopaedic Surgery Operative Note (CSN: [id number])  Jose Bowers  [birth year] Date of Surgery: 01/04/2024   Diagnoses:  Left irreparable cuff tear  Procedure: Left reverse augmented total Shoulder Arthroplasty   Operative Finding Successful completion of planned procedure.   Post-operative plan: The patient will be NWB in sling.  The patient will be will be discharged from PACU if continues to be stable as was plan prior to surgery.  DVT prophylaxis Aspirin  81 mg twice daily for 6 weeks.  Pain control with PRN pain medication preferring oral medicines.  Follow up plan will be scheduled in approximately 7 days for incision check and XR.  Physical therapy to start immediately.  Implants:Tornier 3+ stem perform, +3 retentive poly, 39 sphere, 29 full wedge, 40 center screw and 4 peripheral screws  Post-Op Diagnosis: Same Surgeons:Primary: Cristy Bonner DASEN, MD Assistants:Kirstin Shepperson, PA-C Location: [facility] ROOM 09 Anesthesia: General with Exparel Interscalene Antibiotics: Ancef 2g preop, Vancomycin  1000mg  locally Tourniquet time: None Estimated Blood Loss: 100 Complications: None Specimens: None Implants: Implant Name Type Inv. Item Serial No. Manufacturer Lot No. LRB No. Used Action  GLENOSPHERE STD 39 - [id number] Joint GLENOSPHERE STD 39 [id number] [employer]  Left 1 Implanted  BASEPLATE SHOULDER FW 15D 29 - [id number] Joint BASEPLATE SHOULDER FW 15D 29 [id number] [employer]  Left 1 Implanted  SCREW BONE 6.5X40 SM - [id number] Screw SCREW BONE 6.5X40 SM  [employer]  Left 1 Implanted  SCREW PERIPHERAL 30 - [id number] Screw SCREW PERIPHERAL 30  [employer]  Left 1 Implanted  SCREW 5.5X22 - [id number] Screw SCREW 5.5X22  [employer]  Left 1 Implanted  SCREW 5.5X26 - [id number] Screw SCREW 5.5X26  [employer]  Left 2 Implanted  STEM HUM PERFORM 3+ - [id number] Stem STEM HUM PERFORM 3+ [id number] [employer]  Left 1 Implanted  CUP HUM REV SHLD 3/4 39 +3 - [id number] Cup CUP HUM REV  SHLD 3/4 39 +3 [id number] [employer]  Left 1 Implanted    Indications for Surgery:   Jose Bowers is a 73 y.o. male with cuff tear arthropathy.  Benefits and risks of operative and nonoperative management were discussed prior to surgery with patient/guardian(s) and informed consent form was completed.  Infection and need for further surgery were discussed as was prosthetic stability and cuff issues.  We additionally specifically discussed risks of axillary nerve injury, infection, periprosthetic fracture, continued pain and longevity of implants prior to beginning procedure.      Procedure:   The patient was identified in the preoperative holding area where the surgical site was marked. Block placed by anesthesia with exparel.  The patient was taken to the OR where a procedural timeout was called and the above noted anesthesia was induced.  The patient was positioned beachchair on allen table with spider arm positioner.  Preoperative antibiotics were dosed.  The patient's left shoulder was prepped and draped in the usual sterile fashion.  A second preoperative timeout was called.       Standard deltopectoral approach was performed with a #10 blade. We dissected down to the subcutaneous tissues and the cephalic vein was taken laterally with the deltoid. Clavipectoral fascia was incised in line with the incision. Deep retractors were placed. The long of the biceps tendon was identified and there was significant tenosynovitis present.  Tenodesis was performed to the pectoralis tendon with #2 Ethibond. The remaining biceps was followed up into the rotator interval where it was released.   The subscapularis was taken down in a  full thickness layer with capsule along the humeral neck extending inferiorly around the humeral head. We continued releasing the capsule directly off of the osteophytes inferiorly all the way around the corner. This allowed us  to dislocate the humeral head.   There were  osteophytes along the inferior humeral neck. The osteophytes were removed with an osteotome and a rongeur.  Osteophytes were removed with a rongeur and an osteotome and the anatomic neck was well visualized.     A humeral cutting guide was used extra medullary with a pin to help control version. The version was set at 20 of retroversion. Humeral osteotomy was performed with an oscillating saw. The head fragment was passed off the back table.  A cut protector plate was placed.  The subscapularis was again identified and immediately we took care to palpate the axillary nerve anteriorly and verify its position with gentle palpation as well as the tug test.  We then released the SGHL with bovie cautery prior to placing a curved mayo at the junction of the anterior glenoid well above the axillary nerve and bluntly dissecting the subscapularis from the capsule.  We then carefully protected the axillary nerve as we gently released the inferior capsule to fully mobilize the subscapularis.  An anterior deltoid retractor was then placed as well as a small Hohmann retractor superiorly.   The remaining labrum was removed circumferentially taking great care not to disrupt the posterior capsule.   At this point we felt based on blueprint templating that a full wedge augment was necessary.  We began by using a full wedge guide to place our center pin as was templated.  We had good position of this pin and we proceeded with our starter center drill.  This allowed for us  to use the 15 degree full wedge reamer obtaining circumferential witness marks and good bone preparation for ingrowth.  At this point we proceeded with our center drill and had an intact vault.  We then drilled our center screw to a length of 40 mm.    We selected a 6.5 mm x 40 mm screw and the full wedge baseplate which was placed in the same orientation as our reaming.  We double checked that we had good apposition of the base plate to bone and then  proceeded to place 3 locking screws and one nonlocking screw as is typical.   Next a 39 mm glenosphere was selected and impacted onto the baseplate. The center screw was tightened.  We turned attention back to the humeral side. The cut protector was removed.  We used the perform humeral sizing block to select the appropriate size which for this patient was a 3.  We then placed our center pin and reamed over it concentrically obtaining appropriate inset.  We then used our lateralizing chisel to prepare the lateral aspect of the humerus.  At that point we selected the appropriate implant trialing a 3+.  Using this trial implant we trialed multiple polyethylene sizes settling on a 3 retentive which provided good stability and range of motion without excess soft tissue tension. The offset was dialed in to match the normal anatomy. The shoulder was trialed.  There was good ROM in all planes and the shoulder was stable with no inferior translation.  The real humeral implants were opened after again confirming sizes.  The trial was removed. #5 Fiberwire x4 sutures passed through the humeral neck for subscap repair. The humeral component was press-fit obtaining a secure fit. The joint  was reduced and thoroughly irrigated with pulsatile lavage. Subscap was repaired back with #5 Fiberwire sutures through bone tunnels. Hemostasis was obtained. The deltopectoral interval was reapproximated with #1 Ethibond. The subcutaneous tissues were closed with 2-0 Vicryl and the skin was closed with running monocryl.    The wounds were cleaned and dried and an Aquacel dressing was placed. The drapes taken down. The arm was placed into sling with abduction pillow. Patient was awakened, extubated, and transferred to the recovery room in stable condition. There were no intraoperative complications. The sponge, needle, and attention counts were  correct at the end of the case.     Kirstin Shepperson, PA-C, present and scrubbed  throughout the case, critical for completion in a timely fashion, and for retraction, instrumentation, closure.

## 2024-01-05 ENCOUNTER — Encounter (HOSPITAL_COMMUNITY): Payer: Self-pay | Admitting: Orthopaedic Surgery

## 2024-01-06 DIAGNOSIS — Z4789 Encounter for other orthopedic aftercare: Secondary | ICD-10-CM | POA: Diagnosis not present

## 2024-01-06 DIAGNOSIS — Z96619 Presence of unspecified artificial shoulder joint: Secondary | ICD-10-CM | POA: Diagnosis not present

## 2024-01-06 DIAGNOSIS — M19012 Primary osteoarthritis, left shoulder: Secondary | ICD-10-CM | POA: Diagnosis not present

## 2024-01-09 DIAGNOSIS — Z96619 Presence of unspecified artificial shoulder joint: Secondary | ICD-10-CM | POA: Diagnosis not present

## 2024-01-09 DIAGNOSIS — M19012 Primary osteoarthritis, left shoulder: Secondary | ICD-10-CM | POA: Diagnosis not present

## 2024-01-09 DIAGNOSIS — Z4789 Encounter for other orthopedic aftercare: Secondary | ICD-10-CM | POA: Diagnosis not present

## 2024-01-10 DIAGNOSIS — Z96612 Presence of left artificial shoulder joint: Secondary | ICD-10-CM | POA: Diagnosis not present

## 2024-01-10 DIAGNOSIS — Z471 Aftercare following joint replacement surgery: Secondary | ICD-10-CM | POA: Diagnosis not present

## 2024-01-12 DIAGNOSIS — Z4789 Encounter for other orthopedic aftercare: Secondary | ICD-10-CM | POA: Diagnosis not present

## 2024-01-12 DIAGNOSIS — M19012 Primary osteoarthritis, left shoulder: Secondary | ICD-10-CM | POA: Diagnosis not present

## 2024-01-12 DIAGNOSIS — Z96619 Presence of unspecified artificial shoulder joint: Secondary | ICD-10-CM | POA: Diagnosis not present

## 2024-01-17 DIAGNOSIS — Z96619 Presence of unspecified artificial shoulder joint: Secondary | ICD-10-CM | POA: Diagnosis not present

## 2024-01-17 DIAGNOSIS — Z4789 Encounter for other orthopedic aftercare: Secondary | ICD-10-CM | POA: Diagnosis not present

## 2024-01-17 DIAGNOSIS — M19012 Primary osteoarthritis, left shoulder: Secondary | ICD-10-CM | POA: Diagnosis not present

## 2024-01-19 ENCOUNTER — Ambulatory Visit (HOSPITAL_BASED_OUTPATIENT_CLINIC_OR_DEPARTMENT_OTHER): Admitting: Cardiology

## 2024-01-20 DIAGNOSIS — Z4789 Encounter for other orthopedic aftercare: Secondary | ICD-10-CM | POA: Diagnosis not present

## 2024-01-20 DIAGNOSIS — Z96619 Presence of unspecified artificial shoulder joint: Secondary | ICD-10-CM | POA: Diagnosis not present

## 2024-01-20 DIAGNOSIS — M19012 Primary osteoarthritis, left shoulder: Secondary | ICD-10-CM | POA: Diagnosis not present

## 2024-01-24 DIAGNOSIS — Z4789 Encounter for other orthopedic aftercare: Secondary | ICD-10-CM | POA: Diagnosis not present

## 2024-01-24 DIAGNOSIS — M19012 Primary osteoarthritis, left shoulder: Secondary | ICD-10-CM | POA: Diagnosis not present

## 2024-01-24 DIAGNOSIS — Z96619 Presence of unspecified artificial shoulder joint: Secondary | ICD-10-CM | POA: Diagnosis not present

## 2024-01-26 DIAGNOSIS — M19012 Primary osteoarthritis, left shoulder: Secondary | ICD-10-CM | POA: Diagnosis not present

## 2024-01-26 DIAGNOSIS — Z4789 Encounter for other orthopedic aftercare: Secondary | ICD-10-CM | POA: Diagnosis not present

## 2024-01-26 DIAGNOSIS — Z96619 Presence of unspecified artificial shoulder joint: Secondary | ICD-10-CM | POA: Diagnosis not present

## 2024-01-30 DIAGNOSIS — Z4789 Encounter for other orthopedic aftercare: Secondary | ICD-10-CM | POA: Diagnosis not present

## 2024-01-30 DIAGNOSIS — Z96619 Presence of unspecified artificial shoulder joint: Secondary | ICD-10-CM | POA: Diagnosis not present

## 2024-01-30 DIAGNOSIS — M19012 Primary osteoarthritis, left shoulder: Secondary | ICD-10-CM | POA: Diagnosis not present

## 2024-01-31 DIAGNOSIS — Z96612 Presence of left artificial shoulder joint: Secondary | ICD-10-CM | POA: Diagnosis not present

## 2024-01-31 DIAGNOSIS — M25512 Pain in left shoulder: Secondary | ICD-10-CM | POA: Diagnosis not present

## 2024-02-06 DIAGNOSIS — Z4789 Encounter for other orthopedic aftercare: Secondary | ICD-10-CM | POA: Diagnosis not present

## 2024-02-06 DIAGNOSIS — M19012 Primary osteoarthritis, left shoulder: Secondary | ICD-10-CM | POA: Diagnosis not present

## 2024-02-06 DIAGNOSIS — Z96619 Presence of unspecified artificial shoulder joint: Secondary | ICD-10-CM | POA: Diagnosis not present

## 2024-02-08 DIAGNOSIS — Z4789 Encounter for other orthopedic aftercare: Secondary | ICD-10-CM | POA: Diagnosis not present

## 2024-02-08 DIAGNOSIS — M19012 Primary osteoarthritis, left shoulder: Secondary | ICD-10-CM | POA: Diagnosis not present

## 2024-02-08 DIAGNOSIS — Z96619 Presence of unspecified artificial shoulder joint: Secondary | ICD-10-CM | POA: Diagnosis not present

## 2024-02-13 DIAGNOSIS — M19012 Primary osteoarthritis, left shoulder: Secondary | ICD-10-CM | POA: Diagnosis not present

## 2024-02-13 DIAGNOSIS — Z4789 Encounter for other orthopedic aftercare: Secondary | ICD-10-CM | POA: Diagnosis not present

## 2024-02-13 DIAGNOSIS — Z96619 Presence of unspecified artificial shoulder joint: Secondary | ICD-10-CM | POA: Diagnosis not present

## 2024-02-16 DIAGNOSIS — Z4789 Encounter for other orthopedic aftercare: Secondary | ICD-10-CM | POA: Diagnosis not present

## 2024-02-16 DIAGNOSIS — Z96619 Presence of unspecified artificial shoulder joint: Secondary | ICD-10-CM | POA: Diagnosis not present

## 2024-02-16 DIAGNOSIS — M19012 Primary osteoarthritis, left shoulder: Secondary | ICD-10-CM | POA: Diagnosis not present

## 2024-02-20 DIAGNOSIS — M19012 Primary osteoarthritis, left shoulder: Secondary | ICD-10-CM | POA: Diagnosis not present

## 2024-02-20 DIAGNOSIS — Z96619 Presence of unspecified artificial shoulder joint: Secondary | ICD-10-CM | POA: Diagnosis not present

## 2024-02-20 DIAGNOSIS — Z4789 Encounter for other orthopedic aftercare: Secondary | ICD-10-CM | POA: Diagnosis not present

## 2024-02-23 DIAGNOSIS — Z96619 Presence of unspecified artificial shoulder joint: Secondary | ICD-10-CM | POA: Diagnosis not present

## 2024-02-23 DIAGNOSIS — M19012 Primary osteoarthritis, left shoulder: Secondary | ICD-10-CM | POA: Diagnosis not present

## 2024-02-23 DIAGNOSIS — Z4789 Encounter for other orthopedic aftercare: Secondary | ICD-10-CM | POA: Diagnosis not present

## 2024-02-28 DIAGNOSIS — Z4789 Encounter for other orthopedic aftercare: Secondary | ICD-10-CM | POA: Diagnosis not present

## 2024-02-28 DIAGNOSIS — M19012 Primary osteoarthritis, left shoulder: Secondary | ICD-10-CM | POA: Diagnosis not present

## 2024-02-28 DIAGNOSIS — Z96619 Presence of unspecified artificial shoulder joint: Secondary | ICD-10-CM | POA: Diagnosis not present

## 2024-03-01 DIAGNOSIS — Z4789 Encounter for other orthopedic aftercare: Secondary | ICD-10-CM | POA: Diagnosis not present

## 2024-03-01 DIAGNOSIS — Z96619 Presence of unspecified artificial shoulder joint: Secondary | ICD-10-CM | POA: Diagnosis not present

## 2024-03-01 DIAGNOSIS — M19012 Primary osteoarthritis, left shoulder: Secondary | ICD-10-CM | POA: Diagnosis not present

## 2024-03-11 NOTE — Progress Notes (Unsigned)
 " Cardiology Office Note:    Date:  03/12/2024   ID:  Jose Bowers, DOB 09-Jul-1950,  [id number]  PCP:  Ransom Other, MD  Cardiologist:  Lonni LITTIE Nanas, MD  Electrophysiologist:  None   Referring MD: Ransom Other, MD   Chief Complaint  Patient presents with   Coronary Artery Disease    History of Present Illness:    Jose Bowers is a 73 y.o. male with a hx of CAD status post CABG (LIMA-LAD, SVG-OM, SVG sequential to PDA and PLB) factor V Leiden, hypertension, hyperlipidemia, DVT following shoulder surgery in 2014, saddle PE treated with EKOS in 2019 who presents for follow-up.  He was referred by Dr. Husain for evaluation of chest pain, initially seen on 09/19/2019.  Exercise Myoview  was attempted on 10/04/2019.  He did not reach target heart rate on treadmill and was converted to pharmacologic study.  Despite not reaching target heart rate, he had 1.5 mm horizontal ST depressions in inferior and anterolateral leads and symptoms of chest pain.  MPI imaging showed reversible defect in apical anterior wall and apex consistent with LAD ischemia.  LVEF 49%.  Cardiac catheterization on 10/16/2019 showed severe heavily calcified multivessel disease (99% proximal LAD, 90% proximal circumflex, 80% distal RCA, 90% RPDA).  Underwent CABG 10/18/19 ((LIMA-LAD, SVG-OM, SVG sequential to PDA and PLB).  Echocardiogram 10/16/2019 showed normal biventricular function, no significant valvular disease.  Reported chest pain and underwent stress PET 05/05/2023, which showed large reversible severe lateral wall perfusion defect; high risk study.  LHC on 05/13/2023 showed patent LIMA-LAD, occluded SVG-OM and occluded sequential SVG to PDA to PLV, occluded proximal LAD, high-grade proximal LCx disease, moderate distal RCA disease.  Echocardiogram 05/30/2023 showed EF 55 to 60%, normal RV function, no significant valvular disease.  Underwent PCI to LCx on 06/14/2023 with orbital atherectomy, Cutting Balloon angioplasty  with drug-eluting stent placement.  Since last clinic visit, he reports he is doing well.  He underwent shoulder surgery and has been recovering well.  Denies any chest pain, dyspnea, lightheadedness, syncope, or palpitations.  Did report some knee pain and swelling recently.  He is walking [phone] steps per day.  Denies any bleeding issues on Xarelto /aspirin .    Wt Readings from Last 3 Encounters:  03/12/24 195 lb 9.6 oz (88.7 kg)  01/04/24 194 lb (88 kg)  12/22/23 194 lb (88 kg)    BP Readings from Last 3 Encounters:  03/12/24 128/82  01/04/24 131/79  12/22/23 134/88     Past Medical History:  Diagnosis Date   Coronary artery disease    Factor V Leiden    Hypertension     Past Surgical History:  Procedure Laterality Date   CORONARY ARTERY BYPASS GRAFT N/A 10/18/2019   Procedure: CORONARY ARTERY BYPASS GRAFTING (CABG), ON PUMP, TIMES FOUR, USING LEFT INTERAL MAMMARY ATERY AND ENDOSCOPICALLY HARVESTED RIGHT GREATER SAPHENOUS VEIN;  Surgeon: Lucas Dorise POUR, MD;  Location: [facility] OR;  Service: Open Heart Surgery;  Laterality: N/A;   CORONARY ATHERECTOMY  06/14/2023   Procedure: CORONARY ATHERECTOMY;  Surgeon: Wendel Lurena POUR, MD;  Location: [facility] INVASIVE CV LAB;  Service: Cardiovascular;;   CORONARY STENT INTERVENTION N/A 06/14/2023   Procedure: CORONARY STENT INTERVENTION;  Surgeon: Wendel Lurena POUR, MD;  Location: [facility]AS[facility] CV [facility];  Service: Cardiovascular;  Laterality: N/A;   HERNIA REPAIR     IR ANGIOGRAM PULMONARY BILATERAL SELECTIVE  03/19/2017   IR ANGIOGRAM SELECTIVE EACH ADDITIONAL VESSEL  03/19/2017   IR ANGIOGRAM SELECTIVE EACH ADDITIONAL VESSEL  03/19/2017   IR INFUSION THROMBOL ARTERIAL INITIAL (MS)  03/19/2017   IR INFUSION THROMBOL ARTERIAL INITIAL (MS)  03/19/2017   IR THROMB F/U EVAL ART/VEN FINAL DAY (MS)  03/20/2017   IR US  GUIDE VASC ACCESS RIGHT  03/19/2017   JOINT REPLACEMENT     LEFT HEART CATH AND CORONARY ANGIOGRAPHY N/A 10/16/2019   Procedure: LEFT HEART CATH AND CORONARY  ANGIOGRAPHY;  Surgeon: Anner Alm ORN, MD;  Location: [facility] INVASIVE CV LAB;  Service: Cardiovascular;  Laterality: N/A;   LEFT HEART CATH AND CORONARY ANGIOGRAPHY N/A 06/14/2023   Procedure: LEFT HEART CATH AND CORONARY ANGIOGRAPHY;  Surgeon: Wendel Lurena POUR, MD;  Location: [facility] INVASIVE CV LAB;  Service: Cardiovascular;  Laterality: N/A;   LEFT HEART CATH AND CORS/GRAFTS ANGIOGRAPHY N/A 05/13/2023   Procedure: LEFT HEART CATH AND CORS/GRAFTS ANGIOGRAPHY;  Surgeon: Wendel Lurena POUR, MD;  Location: [facility] INVASIVE CV LAB;  Service: Cardiovascular;  Laterality: N/A;   REVERSE SHOULDER ARTHROPLASTY Left 01/04/2024   Procedure: ARTHROPLASTY, SHOULDER, TOTAL, REVERSE;  Surgeon: Cristy Bonner DASEN, MD;  Location: [facility] ORS;  Service: Orthopedics;  Laterality: Left;   ROTATOR CUFF REPAIR     TEE WITHOUT CARDIOVERSION N/A 10/18/2019   Procedure: TRANSESOPHAGEAL ECHOCARDIOGRAM (TEE);  Surgeon: Lucas Dorise POUR, MD;  Location: [facility] OR;  Service: Open Heart Surgery;  Laterality: N/A;    Current Medications: Current Meds  Medication Sig   aspirin  EC 81 MG tablet Take 1 tablet (81 mg total) by mouth daily. Swallow whole.   atorvastatin  (LIPITOR ) 80 MG tablet TAKE 1 TABLET BY MOUTH DAILY AT 6 PM.   losartan  (COZAAR ) 50 MG tablet Take 50 mg by mouth daily.   metoprolol  succinate (TOPROL -XL) 25 MG 24 hr tablet Take 1 tablet (25 mg total) by mouth daily. Take with or immediately following a meal.   XARELTO  20 MG TABS tablet Take 20 mg by mouth daily.     Allergies:   Patient has no known allergies.   Social History   Socioeconomic History   Marital status: Married    Spouse name: Not on file   Number of children: 1   Years of education: Not on file   Highest education level: Not on file  Occupational History   Occupation: retired     Comment: dentist   Tobacco Use   Smoking status: Never   Smokeless tobacco: Never  Vaping Use   Vaping status: Never Used  Substance and Sexual Activity   Alcohol use: Yes     Comment: few times a week   Drug use: No   Sexual activity: Not on file  Other Topics Concern   Not on file  Social History Narrative   Not on file   Social Drivers of Health   Tobacco Use: Low Risk (03/12/2024)   Patient History    Smoking Tobacco Use: Never    Smokeless Tobacco Use: Never    Passive Exposure: Not on file  Financial Resource Strain: Not on file  Food Insecurity: Not on file  Transportation Needs: Not on file  Physical Activity: Not on file  Stress: Not on file  Social Connections: Not on file  Depression (EYV7-0): Not on file  Alcohol Screen: Not on file  Housing: Not on file  Utilities: Not on file  Health Literacy: Not on file     Family History: The patient's family history includes Clotting disorder in his brother, father, grandchild, and son; Heart disease in his brother and brother; Hypertension in his brother.  ROS:   Please see the history of present illness.     All other systems reviewed and are negative.  EKGs/Labs/Other Studies Reviewed:    The following studies were reviewed today:  Intraoperative TEE 10/18/2019: - Left Ventricle: The left ventricle is essentially unchanged from  pre-bypass.  - Right Ventricle: The right ventricle appears unchanged from pre-bypass.  - Aorta: The aorta appears unchanged from pre-bypass.  - Left Atrium: The left atrium appears unchanged from pre-bypass.  - Left Atrial Appendage: The left atrial appendage appears unchanged from  pre-bypass.  - Aortic Valve: The aortic valve appears unchanged from pre-bypass.  - Mitral Valve: The mitral valve appears essentially unchanged from  pre-bypass.  - Tricuspid Valve: The tricuspid valve appears essentially unchanged from  pre-bypass.  - Interatrial Septum: The interatrial septum appears unchanged from  pre-bypass.  - Interventricular Septum: The interventricular septum appears unchanged  from  pre-bypass.  - Pericardium: The pericardium appears  unchanged from pre-bypass.   US  Doppler Pre CABG 10/17/2019: Right Carotid: Velocities in the right ICA are consistent with a 1-39%  stenosis.  Left Carotid: Velocities in the left ICA are consistent with a 1-39%  stenosis.  Vertebrals:  Bilateral vertebral arteries demonstrate antegrade flow.  Subclavians: Normal flow hemodynamics were seen in bilateral subclavian arteries.  Right Upper Extremity: Doppler waveforms remain within normal limits with right radial compression. Doppler waveforms remain within normal limits with right ulnar compression.  Left Upper Extremity: Doppler waveforms remain within normal limits with left radial compression. Doppler waveforms remain within normal limits with left ulnar compression.   Echo 10/16/2019:  1. Left ventricular ejection fraction, by estimation, is 55 to 60%. The  left ventricle has normal function. The left ventricle has no regional  wall motion abnormalities. There is mild left ventricular hypertrophy of  the basal-septal segment. Left  ventricular diastolic parameters are consistent with Grade I diastolic  dysfunction (impaired relaxation).   2. Right ventricular systolic function is normal. The right ventricular  size is normal. Tricuspid regurgitation signal is inadequate for assessing  PA pressure.   3. Left atrial size was mildly dilated.   4. The mitral valve is normal in structure. No evidence of mitral valve  regurgitation. No evidence of mitral stenosis.   5. The aortic valve is normal in structure. Aortic valve regurgitation is  not visualized. No aortic stenosis is present.   6. Aortic dilatation noted. There is borderline dilatation of the aortic  root measuring 38 mm.   7. The inferior vena cava is normal in size with greater than 50%  respiratory variability, suggesting right atrial pressure of 3 mmHg.   Comparison(s): No significant change from prior study. Prior images  reviewed side by side.   LHC 10/16/2019: -- LEFT  VENTRICULAR HEMODYNAMICS & VENTRICULOGRAPHY There is mild left ventricular systolic dysfunction. LV end diastolic pressure is normal. The left ventricular ejection fraction is 45-50% by visual estimate. -- CORONARY ANGIOGRAPHY Prox LAD to Mid LAD lesion is 99% stenosed with 50% stenosed side branch in 1st Diag. Prox Cx lesion is 90% stenosed. Dist RCA lesion is 80% stenosed with 90% stenosed side branch in RPDA. SUMMARY  Severe heavily calcified multivessel disease: Heavily calcified proximal LAD subtotal occlusion (99%) lesion beginning just prior to major SP trunk, terminating after 1st Diag --> remainder the LAD is relatively free of disease with TIMI I flow and competitive flow from right left collaterals to the apex Heavily calcified very focal 90% eccentric stenosis in the proximal  LCx--LCx gives off 2 major Mrg branches and a posterior AV groove branch with 2 small PL branches--free of disease. Heavily calcified dominant RCA with diffuse 30% mid to distal disease, and 80% focal stenosis just prior to RPAV continuing into the RPDA with 90% ostial PDA lesion. Mildly reduced LVEF of roughly 45% with mid to apical anterior hypokinesis RECOMMENDATION Admit under Dr. Alvan service for medication optimization and CVTS consultation He will be started on IV Heparin  while we continue to hold his Xarelto . Increase to Max dose Atorvastatin  & adjust BP medications    Lexiscan  Stress Test 10/04/2019: Nuclear stress EF: 49%. The left ventricular ejection fraction is mildly decreased (45-54%). Horizontal ST segment depression ST segment depression of 1.5 mm was noted during stress in the II, III, aVF, V5 and V6 leads, beginning at 7 minutes of stress, and returning to baseline after less than 1 minute of recovery. Findings consistent with ischemia. This is a high risk study.   1. Initial ECG treadmill stress did not reach target HR and was converted to pharmacological study. Despite not  reaching target HR, the patient had 1.5 mm horizontal ST depression in inferior leads (II, III, AVF) and anterolateral leads (V5-6) and symptoms of chest pain. 2. On MPI imaging, there is a medium size (10-12% of the LV), moderate perfusion defect present on stress imaging in the apical anterior, and apex consistent with ischemia in the LAD distrubution.  3. LVEF is normal for this modality, 49%.  4. Average exercise capacity (8:11 min:s; 7.0 METS). 5. Overall, this is a high risk study concerning for ischemia.  EKG:   03/28/2023: Normal sinus rhythm, rate 62 10/14/22: Normal sinus rhythm, rate 62, no ST abnormalities 02/20/21: NSR, rate 81, no ST abnormalities 08/18/2020: NSR, rate 57, No ST abnormalities 11/16/2019: normal sinus rhythm, rate 63, no ST abnormalities 10/09/2019: NSR, rate 72, no ST abnormalities  Recent Labs: 12/22/2023: BUN 17; Creatinine, Ser 0.98; Hemoglobin 14.6; Platelets 260; Potassium 4.3; Sodium 143  Recent Lipid Panel    Component Value Date/Time   CHOL 114 01/18/2020 0845   TRIG 74 01/18/2020 0845   HDL 48 01/18/2020 0845   CHOLHDL 2.4 01/18/2020 0845   CHOLHDL 3.6 10/18/2019 0511   VLDL 24 10/18/2019 0511   LDLCALC 51 01/18/2020 0845    Physical Exam:    VS:  BP 128/82   Pulse 68   Ht 5' 10 (1.778 m)   Wt 195 lb 9.6 oz (88.7 kg)   SpO2 96%   BMI 28.07 kg/m     Wt Readings from Last 3 Encounters:  03/12/24 195 lb 9.6 oz (88.7 kg)  01/04/24 194 lb (88 kg)  12/22/23 194 lb (88 kg)     GEN: in no acute distress HEENT: Normal NECK: No JVD; No carotid bruits CARDIAC: RRR, no murmurs, rubs, gallops RESPIRATORY:  Clear to auscultation without rales, wheezing or rhonchi  ABDOMEN: Soft, non-tender, non-distended MUSCULOSKELETAL:  No edema SKIN: Warm and dry NEUROLOGIC:  Alert and oriented x 3 PSYCHIATRIC:  Normal affect   ASSESSMENT:    1. CAD in native artery   2. Essential hypertension   3. Hyperlipidemia, unspecified hyperlipidemia type       PLAN:    CAD: Cardiac catheterization on 10/16/2019 showed severe heavily calcified multivessel disease (99% proximal LAD, 90% proximal circumflex, 80% distal RCA, 90% RPDA).  Underwent CABG 10/18/19 (LIMA-LAD, SVG-OM, SVG sequential to PDA and PLB).  Echocardiogram on 10/16/2019 showed normal biventricular function, no significant valvular disease.  Reported chest pain and underwent stress PET 05/05/2023, which showed large reversible severe lateral wall perfusion defect; high risk study.  LHC on 05/13/2023 showed patent LIMA-LAD, occluded SVG-OM and occluded sequential SVG to PDA to PLV, occluded proximal LAD, high-grade proximal LCx disease, moderate distal RCA disease.  Echocardiogram 05/30/2023 showed EF 55 to 60%, normal RV function, no significant valvular disease.  Underwent PCI to LCx on 06/14/2023 with orbital atherectomy, Cutting Balloon angioplasty with drug-eluting stent placement. -Continue Xarelto  and aspirin  81 mg daily -Continue atorvastatin  80 mg daily -Continue Toprol -XL 25 mg daily   Hypertension: On losartan  50 mg daily and Toprol -XL 25 mg daily.  Appears controlled   Hyperlipidemia: On atorvastatin  80 mg daily, LDL 49 on 05/2023   Factor V Leiden: History of DVT and PE.  On Xarelto        RTC in 6 months   Medication Adjustments/Labs and Tests Ordered: Current medicines are reviewed at length with the patient today.  Concerns regarding medicines are outlined above.  No orders of the defined types were placed in this encounter.   No orders of the defined types were placed in this encounter.    Patient Instructions  Medication Instructions:  Your physician recommends that you continue on your current medications as directed. Please refer to the Current Medication list given to you today.  *If you need a refill on your cardiac medications before your next appointment, please call your pharmacy*  Lab Work: NONE  Testing/Procedures: NONE  Follow-Up: At [facility], you and your health needs are our priority.  As part of our continuing mission to provide you with exceptional heart care, we have created designated Provider Care Teams.  These Care Teams include your primary Cardiologist (physician) and Advanced Practice Providers (APPs -  Physician Assistants and Nurse Practitioners) who all work together to provide you with the care you need, when you need it.  We recommend signing up for the patient portal called MyChart.  Sign up information is provided on this After Visit Summary.  MyChart is used to connect with patients for Virtual Visits (Telemedicine).  Patients are able to view lab/test results, encounter notes, upcoming appointments, etc.  Non-urgent messages can be sent to your provider as well.   To learn more about what you can do with MyChart, go to [email or web address].    Your next appointment:   6 month(s)  The format for your next appointment:   In Person  Provider:   Lonni Nanas, MD        Signed, Lonni LITTIE Nanas, MD  03/12/2024 12:11 PM    [facility] "

## 2024-03-12 ENCOUNTER — Encounter (HOSPITAL_BASED_OUTPATIENT_CLINIC_OR_DEPARTMENT_OTHER): Payer: Self-pay | Admitting: Cardiology

## 2024-03-12 ENCOUNTER — Ambulatory Visit (INDEPENDENT_AMBULATORY_CARE_PROVIDER_SITE_OTHER): Admitting: Cardiology

## 2024-03-12 VITALS — BP 128/82 | HR 68 | Ht 70.0 in | Wt 195.6 lb

## 2024-03-12 DIAGNOSIS — E785 Hyperlipidemia, unspecified: Secondary | ICD-10-CM

## 2024-03-12 DIAGNOSIS — I10 Essential (primary) hypertension: Secondary | ICD-10-CM | POA: Diagnosis not present

## 2024-03-12 DIAGNOSIS — I2510 Atherosclerotic heart disease of native coronary artery without angina pectoris: Secondary | ICD-10-CM

## 2024-03-12 NOTE — Patient Instructions (Signed)
 Medication Instructions:  Your physician recommends that you continue on your current medications as directed. Please refer to the Current Medication list given to you today.  *If you need a refill on your cardiac medications before your next appointment, please call your pharmacy*  Lab Work: NONE  Testing/Procedures: NONE  Follow-Up: At [facility], you and your health needs are our priority.  As part of our continuing mission to provide you with exceptional heart care, we have created designated Provider Care Teams.  These Care Teams include your primary Cardiologist (physician) and Advanced Practice Providers (APPs -  Physician Assistants and Nurse Practitioners) who all work together to provide you with the care you need, when you need it.  We recommend signing up for the patient portal called MyChart.  Sign up information is provided on this After Visit Summary.  MyChart is used to connect with patients for Virtual Visits (Telemedicine).  Patients are able to view lab/test results, encounter notes, upcoming appointments, etc.  Non-urgent messages can be sent to your provider as well.   To learn more about what you can do with MyChart, go to [email or web address].    Your next appointment:   6 month(s)  The format for your next appointment:   In Person  Provider:   Lonni Nanas, MD
